# Patient Record
Sex: Male | Born: 1965 | Race: White | Hispanic: No | Marital: Single | Smoking: Never smoker
Health system: Southern US, Community
[De-identification: ages and names within clinical notes are randomized; demographics above are authoritative.]

## PROBLEM LIST (undated history)

## (undated) DIAGNOSIS — M1990 Unspecified osteoarthritis, unspecified site: Secondary | ICD-10-CM

## (undated) HISTORY — PX: FRACTURE SURGERY: SHX138

---

## 2015-03-10 ENCOUNTER — Encounter: Payer: Self-pay | Admitting: Sports Medicine

## 2015-03-10 ENCOUNTER — Ambulatory Visit (INDEPENDENT_AMBULATORY_CARE_PROVIDER_SITE_OTHER): Payer: Managed Care, Other (non HMO) | Admitting: Sports Medicine

## 2015-03-10 VITALS — BP 145/95 | HR 108 | Ht 67.0 in | Wt 167.0 lb

## 2015-03-10 DIAGNOSIS — L74512 Primary focal hyperhidrosis, palms: Secondary | ICD-10-CM | POA: Diagnosis not present

## 2015-03-10 DIAGNOSIS — E785 Hyperlipidemia, unspecified: Secondary | ICD-10-CM | POA: Diagnosis not present

## 2015-03-10 DIAGNOSIS — M65939 Unspecified synovitis and tenosynovitis, unspecified forearm: Secondary | ICD-10-CM | POA: Insufficient documentation

## 2015-03-10 DIAGNOSIS — M720 Palmar fascial fibromatosis [Dupuytren]: Secondary | ICD-10-CM

## 2015-03-10 DIAGNOSIS — M6588 Other synovitis and tenosynovitis, other site: Secondary | ICD-10-CM | POA: Diagnosis not present

## 2015-03-10 DIAGNOSIS — M659 Synovitis and tenosynovitis, unspecified: Secondary | ICD-10-CM | POA: Insufficient documentation

## 2015-03-10 DIAGNOSIS — Z0000 Encounter for general adult medical examination without abnormal findings: Secondary | ICD-10-CM | POA: Insufficient documentation

## 2015-03-10 MED ORDER — ALUMINUM CHLORIDE 20 % EX SOLN: CUTANEOUS | Status: DC

## 2015-03-10 NOTE — Assessment & Plan Note (Signed)
Checking routine blood work. He does have been Dupuytren's contracture so we do need to consider checking for diabetes.

## 2015-03-10 NOTE — Progress Notes (Signed)
Subjective:    CC: Establish care.   HPI:  Right hand pain: Pain is localized at the base of the first metacarpal, as well as in the volar palmar soft tissues. Symptoms have been present for years. Moderate, persistent without radiation.  Preventative measures: Has not had any routine blood work in a long time.  Hyperhidrosis: During the entire examination, I noticed excessive sweating of both hands, to the point where they were dripping with sweat. He has never had this treated, and would like treatment.  Past medical history, Surgical history, Family history not pertinant except as noted below, Social history, Allergies, and medications have been entered into the medical record, reviewed, and no changes needed.   Review of Systems: No headache, visual changes, nausea, vomiting, diarrhea, constipation, dizziness, abdominal pain, skin rash, fevers, chills, night sweats, swollen lymph nodes, weight loss, chest pain, body aches, joint swelling, muscle aches, shortness of breath, mood changes, visual or auditory hallucinations.  Objective:    General: Well Developed, well nourished, and in no acute distress.  Neuro: Alert and oriented x3, extra-ocular muscles intact, sensation grossly intact.  HEENT: Normocephalic, atraumatic, pupils equal round reactive to light, neck supple, no masses, no lymphadenopathy, thyroid nonpalpable.  Skin: Warm and dry, no rashes noted. 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  Respiratory: Clear to auscultation bilaterally. Not using accessory muscles, speaking in full sentences.  Abdominal: Soft, nontender, nondistended, positive bowel sounds, no masses, no organomegaly.  Right hand: Palpable nodularity in the palm are aponeurosis proximal to the fourth digit, with good range of motion, he is also able to put his hand flat on the table, there is also tenderness to palpation at the distal  insertion of the flexor carpi radialis tendon. Negative Tinel's sign, negative Phalen sign, negative Finkelstein sign. There is reproduction of proximal pain with resisted flexion and radial deviation of the wrist. This confirms flexor carpi radialis tendinosis.  Procedure: Real-time Ultrasound Guided Injection of Dupuytren's contracture Device: GE Logiq E  Verbal informed consent obtained.  Time-out conducted.  Noted no overlying erythema, induration, or other signs of local infection.  Skin prepped in a sterile fashion.  Local anesthesia: Topical Ethyl chloride.  With sterile technique and under real time ultrasound guidance:  25-gauge needle advanced into the palm are aponeurosis just proximal to the fourth digit and injected 0.5 mL kenalog 40, 0.5 mL lidocaine. Completed without difficulty  Pain immediately resolved suggesting accurate placement of the medication.  Advised to call if fevers/chills, erythema, induration, drainage, or persistent bleeding.  Images permanently stored and available for review in the ultrasound unit.  Impression: Technically successful ultrasound guided injection.  Procedure: Real-time Ultrasound Guided Injection of right flexor carpi radialis insertion Device: GE Logiq E  Verbal informed consent obtained.  Time-out conducted.  Noted no overlying erythema, induration, or other signs of local infection.  Skin prepped in a sterile fashion.  Local anesthesia: Topical Ethyl chloride.  With sterile technique and under real time ultrasound guidance: Noted hypoechoic change at the distal insertion of the flexor carpi radialis, 25-gauge needle advanced into the tendon sheath and 0.5 mL kenalog 40, 0.5 mL lidocaine ejected easily.  Completed without difficulty  Pain immediately resolved suggesting accurate placement of the medication.  Advised to call if fevers/chills, erythema, induration, drainage, or persistent bleeding.  Images permanently stored and available  for review in the ultrasound unit.  Impression: Technically successful ultrasound guided injection.  Impression and  Recommendations:    The patient was counselled, risk factors were discussed, anticipatory guidance given.

## 2015-03-10 NOTE — Assessment & Plan Note (Signed)
Topical Drysol. Return in a month.

## 2015-03-10 NOTE — Assessment & Plan Note (Signed)
Injection as above 

## 2015-03-10 NOTE — Assessment & Plan Note (Signed)
Mild and principally involves the fourth digit, and the palmar aponeurosis. Patient is fully able to flex his hand on the table, I did perform an injection into the palmar aponeurosis, if persistent or progressive symptoms he would be a candidate for collagenase injection.

## 2015-03-10 NOTE — Patient Instructions (Signed)
Dupuytren's Contracture Dupuytren's contracture affects the fingers and the palm of the hand. This condition usually develops slowly. It may take many years to develop. The pinky finger and the ring finger are most often affected. These fingers start to curve inward, like a claw. At some point, the fingers cannot go straight anymore. This can make it hard to do things like:  Put on gloves.  Shake hands.  Grab something off a shelf. The condition usually does not cause pain and is not dangerous. The condition gets its name from the doctor who came up with an operation to fix the problem. His name was Baron Guillaume Dupuytren. Contracture means pulling inward. CAUSES  Dupuytren's contracture does not start with the fingers. It starts in the palm of the hand, under the skin. The tissue under the skin is called fascia. The fascia covers the cords (tendons) that control how the fingers move. In Dupuytren's contracture the fascia tissue becomes thick and then pulls on the cords. That causes the fingers to curl. The condition can affect both hands and any fingers, but it usually strikes one hand worse than the other. The fingers farthest from the thumb are most often the ones that curl. The cause is not clear. Some experts believe it results from an autoimmune reaction. That means the body's immune system (which fights off disease) attacks itself by mistake. What experts do know is that certain conditions and behaviors (called risk factors) make the chance of having this condition more likely. They include:  Age. Most people who have the condition are older than 50.  Sex. It affects men more often than women.  Family history. The condition tends to run in families from countries in [place].  Certain behaviors. People who smoke and drink alcohol are more apt to develop the problem.  Some other medical conditions. Having diabetes makes Dupuytren's contracture more likely. So does  having a condition that involves a seizure (when the brain's function is interrupted). SYMPTOMS  Signs of this condition take time to develop. Sometimes this takes weeks or months. More often, it takes several years.   Early symptoms:  Skin on the palm of the hand becomes thick. This is usually the first sign.  The skin may look dimpled or puckered.  Lumps (nodules) show up on the palm. There may be one or more lumps. They are not painful.  Later symptoms:  Thick cords of tissue form in the palm of the hand.  The pinky and ring fingers start to curl up into the palm.  The fingers cannot be straightened into their normal position. DIAGNOSIS  A physical examination is the main way that a healthcare provider can tell if you have Dupuytren's contracture. Other tests usually are not needed. The caregiver will probably:  Look at your hands. Feel your hands. This is to check for thickening and nodules.  Measure finger motion. This tells how much your fingers have contracted (pulled in).  Do a tabletop test. You will be asked to try to put your hand flat on a table, palm down. TREATMENT  There is no cure for Dupuytren's contracture. But there are ways to treat the symptoms. Options include:  Watching and waiting. The condition develops slowly. Often it does not create problems for a long time. Sometimes the skin gets thick and nodules form, but the fingers never curl. So, in some cases it is best to just watch the condition carefully and wait to see what happens.    Shots (injections). Different substances may be injected, including:  Steroids. These drugs block swelling. These shots should make the condition less uncomfortable. Steroids may also slow down the condition. Shots are given into the nodules. The effect only lasts awhile. More shots may have to be given.  Enzymes. These are proteins. They weaken the thick tissue. After an injection, the caregiver usually stretches the  fingers.  Needling. A needle is pushed through the skin and into the thick tissue. This is done in several spots. The goal is to break up the thickened tissue. Or to weaken it.  Surgery. This may be suggested if you cannot grasp objects. Or, if you can no longer put your hand in your pocket.  A cut (incision) is made in the palm of the hand. The thick tissue is removed.  Sometimes the thick tissue is attached to the skin. Then, the skin must be removed, too. It is replaced with a piece of skin from another place on your body. That is called a skin graft.  Occupational or hand therapy is almost always needed after surgery. This involves special exercises to get back the use of your hand and fingers. After a skin graft, several months of therapy may be needed.  Sometimes the condition comes back, even after surgery.  Other methods. You can do some things on your own. They include:  Stretching the fingers backwards. Do this often.  Warming the hand and massaging it. Again, do this often.  Using tools with padded grips. This should make things easier.  Wearing heavy gloves while working. This protects the hands. PROGNOSIS  Dupuytren's contracture usually develops slowly. There is no cure. But, the symptoms can be treated. Sometimes they come back after treatment, but not always. It is important to remember that this is a functional problem and not a life-threatening condition. Document Released: 07/28/2009 Document Revised: 12/23/2011 Document Reviewed: 07/28/2009 [employer] Patient Information 2015 [employer]. This information is not intended to replace advice given to you by your health care provider. Make sure you discuss any questions you have with your health care provider.  

## 2015-03-16 DIAGNOSIS — E785 Hyperlipidemia, unspecified: Secondary | ICD-10-CM | POA: Insufficient documentation

## 2015-03-16 LAB — CBC
HCT: 44.1 % (ref 39.0–52.0)
Hemoglobin: 15.1 g/dL (ref 13.0–17.0)
MCH: 29.8 pg (ref 26.0–34.0)
MCHC: 34.2 g/dL (ref 30.0–36.0)
MCV: 87 fL (ref 78.0–100.0)
MPV: 10.3 fL (ref 8.6–12.4)
Platelets: 262 K/uL (ref 150–400)
RBC: 5.07 MIL/uL (ref 4.22–5.81)
RDW: 13.2 % (ref 11.5–15.5)
WBC: 11.3 K/uL — ABNORMAL HIGH (ref 4.0–10.5)

## 2015-03-16 LAB — COMPREHENSIVE METABOLIC PANEL WITH GFR
ALT: 25 U/L (ref 0–53)
AST: 24 U/L (ref 0–37)
Alkaline Phosphatase: 67 U/L (ref 39–117)
BUN: 16 mg/dL (ref 6–23)
CO2: 25 meq/L (ref 19–32)
Chloride: 99 meq/L (ref 96–112)
Creat: 0.97 mg/dL (ref 0.50–1.35)
Total Bilirubin: 1.6 mg/dL — ABNORMAL HIGH (ref 0.2–1.2)

## 2015-03-16 LAB — COMPREHENSIVE METABOLIC PANEL
Albumin: 4.4 g/dL (ref 3.5–5.2)
Calcium: 9.5 mg/dL (ref 8.4–10.5)
Glucose, Bld: 88 mg/dL (ref 70–99)
Potassium: 4.4 mEq/L (ref 3.5–5.3)
Sodium: 137 mEq/L (ref 135–145)
Total Protein: 7.2 g/dL (ref 6.0–8.3)

## 2015-03-16 LAB — LIPID PANEL
Cholesterol: 246 mg/dL — ABNORMAL HIGH (ref 0–200)
HDL: 76 mg/dL (ref >=40)
LDL Cholesterol: 127 mg/dL — ABNORMAL HIGH (ref 0–99)
Total CHOL/HDL Ratio: 3.2 ratio
Triglycerides: 213 mg/dL — ABNORMAL HIGH (ref <150)
VLDL: 43 mg/dL — ABNORMAL HIGH (ref 0–40)

## 2015-03-16 LAB — TSH: TSH: 2.853 u[IU]/mL (ref 0.350–4.500)

## 2015-03-16 LAB — VITAMIN D 25 HYDROXY (VIT D DEFICIENCY, FRACTURES): Vit D, 25-Hydroxy: 23 ng/mL — ABNORMAL LOW (ref 30–100)

## 2015-03-16 LAB — HEMOGLOBIN A1C
Hgb A1c MFr Bld: 5.4 % (ref <5.7)
Mean Plasma Glucose: 108 mg/dL (ref <117)

## 2015-03-16 MED ORDER — VITAMIN D (ERGOCALCIFEROL) 1.25 MG (50000 UNIT) PO CAPS: 50000.0000 [IU] | ORAL_CAPSULE | ORAL | Status: DC

## 2015-03-16 NOTE — Addendum Note (Signed)
Addended by: Monica BectonHEKKEKANDAM, Mandeep Kiser J on: 03/16/2015 06:24 PM   Modules accepted: Orders

## 2015-03-27 ENCOUNTER — Ambulatory Visit (INDEPENDENT_AMBULATORY_CARE_PROVIDER_SITE_OTHER): Payer: Managed Care, Other (non HMO) | Admitting: Family Medicine

## 2015-03-27 ENCOUNTER — Ambulatory Visit (INDEPENDENT_AMBULATORY_CARE_PROVIDER_SITE_OTHER): Payer: Managed Care, Other (non HMO) | Admitting: Sports Medicine

## 2015-03-27 ENCOUNTER — Encounter: Payer: Self-pay | Admitting: Family Medicine

## 2015-03-27 ENCOUNTER — Ambulatory Visit (INDEPENDENT_AMBULATORY_CARE_PROVIDER_SITE_OTHER): Payer: Managed Care, Other (non HMO)

## 2015-03-27 VITALS — BP 120/90 | HR 91 | Ht 67.0 in | Wt 166.0 lb

## 2015-03-27 DIAGNOSIS — M25561 Pain in right knee: Secondary | ICD-10-CM

## 2015-03-27 DIAGNOSIS — M65252 Calcific tendinitis, left thigh: Secondary | ICD-10-CM | POA: Insufficient documentation

## 2015-03-27 DIAGNOSIS — M25562 Pain in left knee: Secondary | ICD-10-CM | POA: Diagnosis not present

## 2015-03-27 DIAGNOSIS — M65939 Unspecified synovitis and tenosynovitis, unspecified forearm: Secondary | ICD-10-CM

## 2015-03-27 DIAGNOSIS — M19041 Primary osteoarthritis, right hand: Secondary | ICD-10-CM

## 2015-03-27 DIAGNOSIS — M6588 Other synovitis and tenosynovitis, other site: Secondary | ICD-10-CM

## 2015-03-27 DIAGNOSIS — M720 Palmar fascial fibromatosis [Dupuytren]: Secondary | ICD-10-CM | POA: Diagnosis not present

## 2015-03-27 DIAGNOSIS — M659 Synovitis and tenosynovitis, unspecified: Secondary | ICD-10-CM

## 2015-03-27 NOTE — Assessment & Plan Note (Signed)
Improved significantly after injection 

## 2015-03-27 NOTE — Assessment & Plan Note (Addendum)
Knee effusion was aspirated and injected. I also placed some medicine into the quadriceps tendon sheath. There was visible calcific tendinitis at the distal insertion as well as synovitis of the distal quadricep sheath. X-rays. Knee immobilizer.  Strap with compressive dressing, formal physical therapy. Out of work for 2 weeks.

## 2015-03-27 NOTE — Progress Notes (Signed)
Subjective:    CC: left knee pain  HPI: This is a pleasant 49 year old male, for the past several days he is increasing and severe pain on the top of his left knee, just proximal to the patella., He doesn't recall any injuries and the pain worsened over a few days.severe, persistent.  Right hand pain: Localized at the third MCP, moderate, persistent without radiation, oral medications are ineffective.  Right flexor carpi radialis tendinitis: Completely resolved after injection at the last visit.  Dupuytren's contracture: He had some pain at the last visit, injection just deep to the palmar aponeurosis was effective, pain has resolved.  Past medical history, Surgical history, Family history not pertinant except as noted below, Social history, Allergies, and medications have been entered into the medical record, reviewed, and no changes needed.   Review of Systems: No fevers, chills, night sweats, weight loss, chest pain, or shortness of breath.   Objective:    General: Well Developed, well nourished, and in no acute distress.  Neuro: Alert and oriented x3, extra-ocular muscles intact, sensation grossly intact.  HEENT: Normocephalic, atraumatic, pupils equal round reactive to light, neck supple, no masses, no lymphadenopathy, thyroid nonpalpable.  Skin: Warm and dry, no rashes. Cardiac: Regular rate and rhythm, no murmurs rubs or gallops, no lower extremity edema.  Respiratory: Clear to auscultation bilaterally. Not using accessory muscles, speaking in full sentences. Left Knee: Tender to palpation at the distal quadriceps, with a palpable effusion in the knee. ROM normal in flexion and extension and lower leg rotation. Ligaments with solid consistent endpoints including ACL, PCL, LCL, MCL. Negative Mcmurray's and provocative meniscal tests. Non painful patellar compression. Patellar and quadriceps tendons unremarkable. Hamstring and quadriceps strength is normal.  Procedure:  Diagnostic Ultrasound of  Left knee Device: GE Logiq E  Findings:noted mild effusion, there were also numerous hyperechoic structures casting shadows at the distal quadriceps insertion to the proximal patella.there is also anechoicchange around the proximal quadriceps tendon Images permanently stored and available for review in the ultrasound unit.  Impression: Knee joint effusion, Quadriceps calcific insertional tendinitis, Quadriceps tenosynovitis  Procedure: Real-time Ultrasound Guided Injection of left knee Device: GE Logiq E  Verbal informed consent obtained.  Time-out conducted.  Noted no overlying erythema, induration, or other signs of local infection.  Skin prepped in a sterile fashion.  Local anesthesia: Topical Ethyl chloride.  With sterile technique and under real time ultrasound guidance:  5 mL straw-colored fluid aspirated, syringe switched and 1 mL kenalog 40, 4 mL lidocaine injected easily.  Completed without difficulty  Pain immediately resolved suggesting accurate placement of the medication.  Advised to call if fevers/chills, erythema, induration, drainage, or persistent bleeding.  Images permanently stored and available for review in the ultrasound unit.  Impression: Technically successful ultrasound guided injection.  Procedure: Real-time Ultrasound Guided Injection of left quadriceps tendon sheath Device: GE Logiq E  Verbal informed consent obtained.  Time-out conducted.  Noted no overlying erythema, induration, or other signs of local infection.  Skin prepped in a sterile fashion.  Local anesthesia: Topical Ethyl chloride.  With sterile technique and under real time ultrasound guidance:  Noted hypoechoic change, 25-gauge needle advanced here and 1 mL kenalog 40, 3 mL lidocaine injected easily. Completed without difficulty  Pain immediately resolved suggesting accurate placement of the medication.  Advised to call if fevers/chills, erythema, induration, drainage,  or persistent bleeding.  Images permanently stored and available for review in the ultrasound unit.  Impression: Technically successful ultrasound guided injection.  Procedure:  Real-time Ultrasound Guided Injection of right third metacarpal phalangeal joint Device: GE Logiq E  Verbal informed consent obtained.  Time-out conducted.  Noted no overlying erythema, induration, or other signs of local infection.  Skin prepped in a sterile fashion.  Local anesthesia: Topical Ethyl chloride.  With sterile technique and under real time ultrasound guidance:  Noted copious surrounding osteoarthritis, 25-gauge needle advanced into joint in 0.5 mL kenalog 40, 0.5 mL lidocaine injected easily. Completed without difficulty  Pain immediately resolved suggesting accurate placement of the medication.  Advised to call if fevers/chills, erythema, induration, drainage, or persistent bleeding.  Images permanently stored and available for review in the ultrasound unit.  Impression: Technically successful ultrasound guided injection.  Impression and Recommendations:

## 2015-03-27 NOTE — Progress Notes (Signed)
   Subjective:    Patient ID: Darren Mcdaniel, male    DOB: 03/24/1966, 49 y.o.   MRN: [id number]  HPI L knee pain started wednesday morning. pt denies any injury/trauma. the knee is tender to touch, swollen, hot,he can bear weight, but it hurts to bend his knee. he has used heat and IBU for relief. pain is 8/10 sharp when bending has been achey. Has been getting progressively worse since Wednesday.   Review of Systems     Objective:   Physical Exam  Constitutional: He appears well-developed and well-nourished.  HENT:  Head: Normocephalic and atraumatic.  Musculoskeletal:  Left knee is swollen and fluid is compressible. He is extremely tender with quadriceps attaches to the patella and a good 4-5 inches over the tendon above the patella. He is unable to extend his knee about 20. Nontender along the joint lines. He is unable to completely extend his hip either. In fact he actually lifts up his pelvis to raise his leg while sitting and walking.  Psychiatric: He has a normal mood and affect. His behavior is normal.          Assessment & Plan:  Left knee pain- based on exam today I'm concerned about her quadriceps tendon tear or partial tear. Referred him to Dr. Rodney Langton for further evaluation to that they can do ultrasound to confirm diagnosis. Recommend iec, elevation and compression.

## 2015-03-27 NOTE — Assessment & Plan Note (Signed)
Right third metacarpal phalangeal joint.  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

## 2015-03-27 NOTE — Assessment & Plan Note (Signed)
Completely resolved after injection 

## 2015-03-28 LAB — SYNOVIAL CELL COUNT + DIFF, W/ CRYSTALS
Crystals, Fluid: NONE SEEN
Eosinophils-Synovial: 0 % (ref 0–1)
Lymphocytes-Synovial Fld: 3 % (ref 0–20)
Monocyte/Macrophage: 82 % (ref 50–90)
Neutrophil, Synovial: 15 % (ref 0–25)
WBC, Synovial: 485 uL — ABNORMAL HIGH (ref 0–200)

## 2015-03-31 LAB — BODY FLUID CULTURE
Gram Stain: NONE SEEN
Organism ID, Bacteria: NO GROWTH

## 2015-04-04 ENCOUNTER — Ambulatory Visit (INDEPENDENT_AMBULATORY_CARE_PROVIDER_SITE_OTHER): Payer: Managed Care, Other (non HMO) | Admitting: Rehabilitative and Restorative Service Providers"

## 2015-04-04 ENCOUNTER — Encounter: Payer: Self-pay | Admitting: Rehabilitative and Restorative Service Providers"

## 2015-04-04 DIAGNOSIS — M25562 Pain in left knee: Secondary | ICD-10-CM | POA: Diagnosis not present

## 2015-04-04 DIAGNOSIS — Z7409 Other reduced mobility: Secondary | ICD-10-CM

## 2015-04-04 DIAGNOSIS — M25662 Stiffness of left knee, not elsewhere classified: Secondary | ICD-10-CM | POA: Diagnosis not present

## 2015-04-04 NOTE — Patient Instructions (Signed)
Self-Mobilization: Knee Flexion (Prone)   Bring left heel toward buttocks as close as possible. Hold 5-10____ seconds. Relax. Repeat __10__ times per set. Do _1-2___ sets per session. Do __1-2__ sessions per day.   Self-Mobilization: Knee Flexion (Hook-Lying)    PROM: Knee Flexion   With towel around left heel, gently pull knee up with towel until stretch is felt. Hold _20___ seconds. Repeat _10___ times per set. Do __1-2__ sets per session. Do _1-2___ sessions per day.   Self-Mobilization: Heel Slide (Supine)   Slide left heel toward buttocks until a gentle stretch is felt. Hold _10___ seconds. Relax. Repeat _10___ times per set. Do _1-2___ sets per session. Do _1-2___ sessions per day.   Strengthening: Quadriceps Set   Tighten muscles on top of thighs by pushing knees down into surface. Hold __10__ seconds. Repeat __10__ times per set. Do __1-2__ sets per session. Do _1-2___ sessions per day.   Strengthening: Straight Leg Raise (Phase 1)   Tighten muscles on front of right thigh, then lift leg _10___ inches from surface, keeping knee locked.  Repeat ___10_ times per set. Do _1-2___ sets per session. Do _1-2__ sessions per day.  Hamstring Step 1   Straighten left knee. Keep knee level with other knee or on bolster. Hold _30_ seconds. Relax knee by returning foot to start. Repeat _3__ times. 2-3 times/day   Wean from brace

## 2015-04-04 NOTE — Therapy (Signed)
[facility] [place] [address] [place], [place], [address] Phone: [phone]   Fax:  [phone]  Physical Therapy Evaluation  Patient Details  Name: Darren Mcdaniel MRN: [id number] Date of Birth: 01-Jun-1966 Referring Provider:  Monica Becton,*  Encounter Date: 04/04/2015      PT End of Session - 04/04/15 1013    Visit Number 1   Number of Visits 12   Date for PT Re-Evaluation 05/16/15   PT Start Time 0814   PT Stop Time 0919   PT Time Calculation (min) 65 min   Activity Tolerance Patient tolerated treatment well   Behavior During Therapy [facility] for tasks assessed/performed      History reviewed. No pertinent past medical history.  History reviewed. No pertinent past surgical history.  There were no vitals filed for this visit.  Visit Diagnosis:  Left knee pain - Plan: PT plan of care cert/re-cert  Stiffness of knee joint, left - Plan: PT plan of care cert/re-cert  Decreased functional mobility and endurance - Plan: PT plan of care cert/re-cert      Subjective Assessment - 04/04/15 0812    Subjective Patient reports onset of Lt knee pain ~2 weeks ago with no known injury. Was seen by MD 03/27/15 and received injection with some improvement.   Pertinent History Fracture Lt arm, nose at 49 Years old   How long can you sit comfortably? no limitations   How long can you stand comfortably? no limitations   How long can you walk comfortably? pain with walking - not as bad with brace.   Diagnostic tests X-ray - calcification of tendon   Currently in Pain? Yes   Pain Score 8    Pain Location Knee   Pain Orientation Left   Pain Descriptors / Indicators Dull;Sharp   Pain Type Acute pain   Pain Onset 1 to 4 weeks ago   Pain Frequency Intermittent   Aggravating Factors  Bending knee   Pain Relieving Factors Keeping the knee straight; using Rt leg to move Lt   Effect of Pain on Daily Activities Out of work             [facility] PT Assessment - 04/04/15 0001    Assessment   Medical Diagnosis Lt knee pain   Onset Date/Surgical Date 03/23/15   Hand Dominance Right   Next MD Visit 04/08/15   Balance Screen   Has the patient fallen in the past 6 months No   Home Environment   Living Environment Private residence   Living Arrangements Alone   Type of Home House   Home Access Level entry   Home Layout One level   Prior Function   Level of Independence Independent   Vocation Full time employment   Vocation Requirements [place] - Walking on concrete; lifting ~50 lb; runs Hotel manager; fishing; English as a second language teacher; yard work; household chores; cooking   Observation/Other Assessments   Focus on Therapeutic Outcomes (FOTO)  61%   Sensation   Additional Comments WNL's per patient report   AROM   Right Knee Extension 0   Right Knee Flexion 142   Left Knee Extension 0   Left Knee Flexion 92   Strength   Overall Strength Comments Bilat hips 5/5   Right Knee Flexion 5/5   Right Knee Extension 5/5   Left Knee Flexion 5/5   Left Knee Extension 5/5  Pain with resistance   Flexibility   Soft Tissue Assessment Staci Righter  Length --  tightness noted Lt quad/painful at prox patella   Palpation   Patella mobility decreased mobility Lt          [facility] Adult PT Treatment/Exercise - 04/04/15 0001    Ambulation/Gait   Gait Comments limp Lt LE with immobilizer in place circumducts Lt LE in swing phase, decreased weight bearing phase Lt LE   Posture/Postural Control   Posture Comments stands with weight shifted to Rt LE   Exercises   Exercises --  worked on gait without knee immobilizer   Knee/Hip Exercises: Stretches   Passive Hamstring Stretch 3 reps;30 seconds   Passive Hamstring Stretch Limitations with strap   Knee: Self-Stretch to increase Flexion --  10 reps 10 sec hold with strap   Knee: Self-Stretch Limitations stretch in supine and in prone   Knee/Hip Exercises: Supine   Heel  Slides AAROM;Left;2 sets;10 reps  using strap to assist and towel under heel   Moist Heat Therapy   Number Minutes Moist Heat 10 Minutes   Moist Heat Location Knee  Prior to ROM exercises   Cryotherapy   Number Minutes Cryotherapy 12 Minutes   Cryotherapy Location Knee  after exercise   Type of Cryotherapy Ice pack   Manual Therapy   Manual therapy comments instructed in myofacial release techniques using balls and the stick working through [employer]           PT Education - 04/04/15 0857    Education provided Yes   Education Details Importance of movement in resolving musculoskeletal problems; walking without knee immobilizer; HEP   Person(s) Educated Patient   Methods Explanation;Demonstration;Tactile cues;Verbal cues;Handout   Comprehension Verbalized understanding;Returned demonstration;Verbal cues required;Tactile cues required          PT Short Term Goals - 04/04/15 1030    PT SHORT TERM GOAL #1   Title Patient I with I HEP 04/18/15   Time 2   Period Weeks   Status New   PT SHORT TERM GOAL #2   Title Increaese AROM Lt knee to 130 degrees flexion 04/28/15   Time 2   Period Weeks   Status New   PT SHORT TERM GOAL #3   Title Wean from immobilizer Lt LE completely 04/18/15   Time 2   Period Weeks   Status New   PT SHORT TERM GOAL #4   Title Normal gait pattern 04/25/15   Time 3   Period Weeks   Status New           PT Long Term Goals - 04/04/15 1032    PT LONG TERM GOAL #1   Title Patient I in HEP for discharge - 05/16/15   Time 6   Period Weeks   Status New   PT LONG TERM GOAL #2   Title Full painfree ROM Lt knee 05/16/15   Time 6   Period Weeks   Status New   PT LONG TERM GOAL #3   Title Decresae pain to 0-2/10 05/16/15   Time 6   Period Weeks   Status New   PT LONG TERM GOAL #4   Title Decrease FOTO to </= 32% limitation   Time 6   Period Weeks   Status New           Plan - 04/04/15 1015    Clinical Impression Statement Patient  presents with painful Lt knee with no known cause of injury. He received an injection 03/27/15 with good results. He continues to ambulate in an  immobilizer with abnormal gait pattern. He has limited ROM and pain with active knee movement. he does not have pain with weight bearing. Atrength is good.    Pt will benefit from skilled therapeutic intervention in order to improve on the following deficits Abnormal gait;Decreased range of motion;Difficulty walking;Decreased activity tolerance;Pain;Impaired flexibility;Improper body mechanics;Decreased mobility   Rehab Potential Excellent   PT Frequency 2x / week   PT Duration 6 weeks   PT Treatment/Interventions ADLs/Self Care Home Management;Cryotherapy;Electrical Stimulation;Iontophoresis 4mg /ml Dexamethasone;Moist Heat;Ultrasound;Gait training;Functional mobility training;Therapeutic activities;Therapeutic exercise;Balance training;Neuromuscular re-education;Patient/family education;Manual techniques;Passive range of motion   PT Next Visit Plan Review exercises; add quad sets and SLR; progress with ROM; progress with gait without knee immobilizer; progress functional strengthening Lt LE   PT Home Exercise Plan HEP - working on ROM Lt knee; wean from immobilizer; myofacial release with ball and stick; ice and heat as needed   Consulted and Agree with Plan of Care Patient         Problem List Patient Active Problem List   Diagnosis Date Noted  . Calcific tendinitis of left Quadriceps insertion 03/27/2015  . Primary osteoarthritis of right hand 03/27/2015  . Hyperlipidemia 03/16/2015  . Dupuytren's contracture of right hand 03/10/2015  . 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 Annual physical exam 03/10/2015  . 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    Ranesha Val Rober Minion, PT, MPH 04/04/2015, 11:09 AM  [facility] [address] [place], [place], [address] Phone: [phone]    Fax:  [phone]

## 2015-04-06 ENCOUNTER — Encounter: Payer: Self-pay | Admitting: Rehabilitative and Restorative Service Providers"

## 2015-04-06 ENCOUNTER — Ambulatory Visit (INDEPENDENT_AMBULATORY_CARE_PROVIDER_SITE_OTHER): Payer: Managed Care, Other (non HMO) | Admitting: Rehabilitative and Restorative Service Providers"

## 2015-04-06 DIAGNOSIS — M25662 Stiffness of left knee, not elsewhere classified: Secondary | ICD-10-CM

## 2015-04-06 DIAGNOSIS — M25562 Pain in left knee: Secondary | ICD-10-CM

## 2015-04-06 DIAGNOSIS — Z7409 Other reduced mobility: Secondary | ICD-10-CM

## 2015-04-06 NOTE — Patient Instructions (Signed)
Self-Mobilization: Downward Kneecap Push   With thumbs on upper border of left kneecap, gently push kneecap toward foot. Hold _10-20___ seconds. Repeat _5-10__ times per set. Do _3-4___ sessions per day.   Sit and lie down with knee bent, holding the bend in comfortable position for several minutes  Stand shifting weight side to side - equal to the left  Work on walking without limping

## 2015-04-06 NOTE — Therapy (Signed)
[place]  [address] [place], [place], [address] Phone: [phone]   Fax:  [phone]  Physical Therapy Treatment  Patient Details  Name: Darren Mcdaniel MRN: [id number] Date of Birth: 04-22-1966 Referring Provider:  Silverio Decamp,*  Encounter Date: 04/06/2015      PT End of Session - 04/06/15 0954    Visit Number 2   Number of Visits 12   Date for PT Re-Evaluation 05/16/15   PT Start Time 0853   PT Stop Time 0950   PT Time Calculation (min) 57 min   Activity Tolerance Patient tolerated treatment well;No increased pain      History reviewed. No pertinent past medical history.  History reviewed. No pertinent past surgical history.  There were no vitals filed for this visit.  Visit Diagnosis:  Left knee pain  Stiffness of knee joint, left  Decreased functional mobility and endurance      Subjective Assessment - 04/06/15 0852    Subjective Darren Mcdaniel reports that his knee is feeling some better. He is working on his exercises at home and has weaned out of the knee immobilizer.   Currently in Pain? Yes   Pain Score 5    Pain Location Knee   Pain Orientation Left   Pain Descriptors / Indicators Dull   Pain Type Acute pain   Pain Onset 1 to 4 weeks ago   Pain Frequency Intermittent   Aggravating Factors  Bending knee, steps   Pain Relieving Factors keeping knee straight, using rt leg to move lt   Effect of Pain on Daily Activities remains out of work          [facility] PT Assessment - 04/06/15 0001    AROM   Left Knee Flexion 125   Palpation   Patella mobility decreased mobility Lt          [facility] Adult PT Treatment/Exercise - 04/06/15 0001    Knee/Hip Exercises: Stretches   Passive Hamstring Stretch 3 reps;30 seconds   Passive Hamstring Stretch Limitations with strap   Knee: Self-Stretch to increase Flexion --  10 reps 10 sec hold with strap   Knee: Self-Stretch Limitations stretch in supine  and in prone   Knee/Hip Exercises: Aerobic   Stationary Bike NuStep L2  5 min for ROM  patient did not go through full range   Knee/Hip Exercises: Standing   Wall Squat 10 reps;2 sets  1 2 sec hold   Wall Squat Limitations shallow knee bend   Other Standing Knee Exercises working on gait without limp   Other Standing Knee Exercises weight shift standig at wall 10-20   Knee/Hip Exercises: Supine   Heel Slides AAROM;Left;2 sets;10 reps  using strap to assist and towel under heel   Cryotherapy   Number Minutes Cryotherapy 15 Minutes   Cryotherapy Location Knee  after exercise   Type of Cryotherapy Ice pack   Ultrasound   Ultrasound Location distal quad proximal to patellar border   Ultrasound Parameters 3.3MHZ; 1.2 w/cm2 8 min   Ultrasound Goals Pain;Other (Comment)  tissue extensibility   Manual Therapy   Manual Therapy Soft tissue mobilization;Myofascial release  quads - very tight/tender at superior patella lat>med   Manual therapy comments Patellar mobilization tightest with moving into inferior direction          PT Education - 04/06/15 0952    Education provided Yes   Education Details Continued education re musculoskeletal injuries/healing and the importance of exercises; reviewed and corrected  exercises; added patellar mobilization and weight shift/weight bearing exercises for home.   Person(s) Educated Patient   Methods Explanation;Demonstration;Tactile cues;Verbal cues;Handout   Comprehension Verbalized understanding;Returned demonstration;Verbal cues required;Tactile cues required          PT Short Term Goals - 04/06/15 1000    PT SHORT TERM GOAL #1   Title Patient I with I HEP 04/18/15   Time 2   Period Weeks   Status On-going   PT SHORT TERM GOAL #2   Title Increaese AROM Lt knee to 130 degrees flexion 04/28/15   Baseline AROM 125 degrees today   Time 2   Period Weeks   Status Partially Met   PT SHORT TERM GOAL #3   Title Wean from [place]  completely 04/18/15   Baseline Wearing immobilizer occasionally - remains in ace wrap most of the day   Time 2   Period Weeks   PT SHORT TERM GOAL #4   Title Normal gait pattern 04/25/15   Period Weeks   Status On-going           PT Long Term Goals - 04/06/15 1001    PT LONG TERM GOAL #1   Title Patient I in HEP for discharge - 05/16/15   Time 6   Period Weeks   Status On-going   PT LONG TERM GOAL #2   Title Full painfree ROM Lt knee 05/16/15   Time 6   Period Weeks   Status On-going   PT LONG TERM GOAL #3   Title Decresae pain to 0-2/10 05/16/15   Time 6   Period Weeks   Status On-going   PT LONG TERM GOAL #4   Title Decrease FOTO to </= 32% limitation   Time 6   Period Weeks   Status On-going            Plan - 04/06/15 0955    Clinical Impression Statement Darren Mcdaniel reports improvement in knee pain. He has mostly weaned from the [place] but continues to use the ace wrap some. 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 He is moving his knee more at home.   Pt will benefit from skilled therapeutic intervention in order to improve on the following deficits Abnormal gait;Decreased range of motion;Difficulty walking;Decreased activity tolerance;Pain;Impaired flexibility;Improper body mechanics;Decreased mobility   Rehab Potential Excellent   PT Frequency 2x / week   PT Duration 6 weeks   PT Next Visit Plan Review exercises; continue with soft tissue mobilization through quads and patellar mobilizatioin; add quad sets and SLR if patient has pain free ROM; progress with ROM as needed; progress with gait without knee ace wrap wrking on weight shift and gait pattern; progress functional strengthening Lt LE as indicated.   PT Home Exercise Plan HEP - working on ROM Lt knee; wean from immobilizer; myofacial release with ball and stick; ice and heat as needed   Consulted and Agree with Plan of Care Patient      Problem List Patient  Active Problem List   Diagnosis Date Noted  . Calcific tendinitis of left Quadriceps insertion 03/27/2015  . Primary osteoarthritis of right hand 03/27/2015  . Hyperlipidemia 03/16/2015  . Dupuytren's contracture of right hand 03/10/2015  . 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 Annual physical exam 03/10/2015  . 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    Maleeya Peterkin Nilda Simmer, PT, MPH 04/06/2015, 10:04 AM  [place] [address] [place], [place], [address] Phone: [phone]   Fax:  [phone]      

## 2015-04-07 ENCOUNTER — Telehealth: Payer: Self-pay | Admitting: Sports Medicine

## 2015-04-07 ENCOUNTER — Encounter: Payer: Self-pay | Admitting: Sports Medicine

## 2015-04-07 ENCOUNTER — Ambulatory Visit (INDEPENDENT_AMBULATORY_CARE_PROVIDER_SITE_OTHER): Payer: Managed Care, Other (non HMO) | Admitting: Sports Medicine

## 2015-04-07 VITALS — BP 144/92 | HR 83 | Ht 66.0 in | Wt 163.0 lb

## 2015-04-07 DIAGNOSIS — M65252 Calcific tendinitis, left thigh: Secondary | ICD-10-CM | POA: Diagnosis not present

## 2015-04-07 DIAGNOSIS — M19041 Primary osteoarthritis, right hand: Secondary | ICD-10-CM | POA: Diagnosis not present

## 2015-04-07 NOTE — Telephone Encounter (Signed)
Patient walked in and gave fax number [phone] [employer] for [employer]. Thanks

## 2015-04-07 NOTE — Progress Notes (Signed)
  Subjective:    CC: Follow-up  HPI: Darren Mcdaniel returns, his right third MCP is completely better after the injection. He also has had near complete resolution of his quadriceps tendinitis pain, and only has minimal insertional tenderness, he continues with physical therapy. Happy with how things are going but not yesterday to return to work, he is currently on short-term disability.  Past medical history, Surgical history, Family history not pertinant except as noted below, Social history, Allergies, and medications have been entered into the medical record, reviewed, and no changes needed.   Review of Systems: No fevers, chills, night sweats, weight loss, chest pain, or shortness of breath.   Objective:    General: Well Developed, well nourished, and in no acute distress.  Neuro: Alert and oriented x3, extra-ocular muscles intact, sensation grossly intact.  HEENT: Normocephalic, atraumatic, pupils equal round reactive to light, neck supple, no masses, no lymphadenopathy, thyroid nonpalpable.  Skin: Warm and dry, no rashes. Cardiac: Regular rate and rhythm, no murmurs rubs or gallops, no lower extremity edema.  Respiratory: Clear to auscultation bilaterally. Not using accessory muscles, speaking in full sentences. Right hand: No longer tender or swollen over the third metacarpal phalangeal joint Left Knee: Only minimal visible fullness at the quadriceps insertion into the proximal patella, with minimal tenderness at this location. ROM normal in flexion and extension and lower leg rotation. Ligaments with solid consistent endpoints including ACL, PCL, LCL, MCL. Negative Mcmurray's and provocative meniscal tests. Non painful patellar compression. Patellar and quadriceps tendons unremarkable. Hamstring and quadriceps strength is normal.  Impression and Recommendations:

## 2015-04-07 NOTE — Telephone Encounter (Signed)
Last 3 OV notes from Dr. Karie Schwalbe has been faxed to [facility]. [phone]. Darren Mcdaniel,CMA

## 2015-04-07 NOTE — Assessment & Plan Note (Signed)
Pain and swelling lately resolved after right third MCP injection at the last visit

## 2015-04-07 NOTE — Telephone Encounter (Signed)
Darren Mcdaniel would you fax his last couple office notes to this number?   Thank you.

## 2015-04-07 NOTE — Assessment & Plan Note (Signed)
Has done exquisitely well with regards to the calcific tendinitis of the left quadriceps insertion after percutaneous tenotomy with injection, he did have a significant quadriceps sheath effusion. Continue physical therapy for at least another month, pain is 100% better at rest and approximately 50% better with activity. He has not yet plateaued.  Continue additional month off of work, he is currently on short-term disability.

## 2015-04-10 ENCOUNTER — Ambulatory Visit (INDEPENDENT_AMBULATORY_CARE_PROVIDER_SITE_OTHER): Payer: Managed Care, Other (non HMO) | Admitting: Physical Therapy

## 2015-04-10 DIAGNOSIS — M25562 Pain in left knee: Secondary | ICD-10-CM | POA: Diagnosis not present

## 2015-04-10 DIAGNOSIS — M25662 Stiffness of left knee, not elsewhere classified: Secondary | ICD-10-CM | POA: Diagnosis not present

## 2015-04-10 DIAGNOSIS — Z7409 Other reduced mobility: Secondary | ICD-10-CM

## 2015-04-10 NOTE — Therapy (Signed)
[facility] [place] [address] [place], [place], [address] Phone: [phone]   Fax:  [phone]  Physical Therapy Treatment  Patient Details  Name: Darren Mcdaniel MRN: [id number] Date of Birth: 20-Nov-1965 Referring Provider:  Monica Becton,*  Encounter Date: 04/10/2015      PT End of Session - 04/10/15 1014    Visit Number 3   Number of Visits 12   Date for PT Re-Evaluation 05/16/15   PT Start Time 0927   PT Stop Time 1012   PT Time Calculation (min) 45 min   Activity Tolerance Patient tolerated treatment well;No increased pain   Behavior During Therapy [facility] for tasks assessed/performed      No past medical history on file.  No past surgical history on file.  There were no vitals filed for this visit.  Visit Diagnosis:  Left knee pain  Stiffness of knee joint, left  Decreased functional mobility and endurance      Subjective Assessment - 04/10/15 0931    Subjective getting better; doing exercises "some" (1x/day).  "I've been doing a lot of walking, does that count?"   Diagnostic tests X-ray - calcification of tendon   Currently in Pain? Yes   Pain Score 2    Pain Location Knee   Pain Orientation Left   Pain Descriptors / Indicators Dull;Aching;Sore   Pain Type Acute pain   Pain Onset 1 to 4 weeks ago   Aggravating Factors  bending knee and deep squatting            [facility] PT Assessment - 04/10/15 0947    AROM   Left Knee Flexion 139                     [facility] Adult PT Treatment/Exercise - 04/10/15 0933    Knee/Hip Exercises: Aerobic   Stationary Bike NuStep L4 x 6 min (improved motion); Rec bike x 6 min level 1 for ROM   Knee/Hip Exercises: Supine   Quad Sets Strengthening;Left;2 sets;10 reps   Short Arc [employer] Sets Strengthening;Left;2 sets;10 reps   Short Arc Quad Sets Limitations 2#   Heel Slides AAROM;Left;1 set;10 reps   Heel Slides Limitations with strap   Straight Leg  Raises Left;2 sets;10 reps   Straight Leg Raises Limitations 2#   Ultrasound   Ultrasound Location distal quad   Ultrasound Parameters 3.3 mHz; 1.1 w/cm2, 100% DC x 8 min   Ultrasound Goals Pain;Other (Comment)  tissue extensibility                  PT Short Term Goals - 04/10/15 1015    PT SHORT TERM GOAL #1   Title Patient I with I HEP 04/18/15   Status On-going   PT SHORT TERM GOAL #2   Title Increaese AROM Lt knee to 130 degrees flexion 04/28/15   Status Achieved   PT SHORT TERM GOAL #3   Title Wean from immobilizer Lt LE completely 04/18/15   Status Achieved   PT SHORT TERM GOAL #4   Title Normal gait pattern 04/25/15   Status On-going           PT Long Term Goals - 04/06/15 1001    PT LONG TERM GOAL #1   Title Patient I in HEP for discharge - 05/16/15   Time 6   Period Weeks   Status On-going   PT LONG TERM GOAL #2   Title Full painfree ROM Lt knee 05/16/15  Time 6   Period Weeks   Status On-going   PT LONG TERM GOAL #3   Title Decresae pain to 0-2/10 05/16/15   Time 6   Period Weeks   Status On-going   PT LONG TERM GOAL #4   Title Decrease FOTO to </= 32% limitation   Time 6   Period Weeks   Status On-going               Plan - 04/10/15 1014    Clinical Impression Statement Pt tolerated increased strengthening exercises today but noticable fatigue of quad with mat level exercises.  ROM WNL at this time.   PT Next Visit Plan add strengthening exercises   Consulted and Agree with Plan of Care Patient        Problem List Patient Active Problem List   Diagnosis Date Noted  . Calcific tendinitis of left Quadriceps insertion 03/27/2015  . Primary osteoarthritis of right hand 03/27/2015  . Hyperlipidemia 03/16/2015  . Dupuytren's contracture of right hand 03/10/2015  . 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 Annual physical exam 03/10/2015  . 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   Clarita Crane, PT,  DPT 04/10/2015 10:16 AM  [facility] [address] [place], [place], [address] Phone: [phone]   Fax:  [phone]

## 2015-04-12 ENCOUNTER — Ambulatory Visit (INDEPENDENT_AMBULATORY_CARE_PROVIDER_SITE_OTHER): Payer: Managed Care, Other (non HMO) | Admitting: Physical Therapy

## 2015-04-12 DIAGNOSIS — Z7409 Other reduced mobility: Secondary | ICD-10-CM | POA: Diagnosis not present

## 2015-04-12 DIAGNOSIS — M25562 Pain in left knee: Secondary | ICD-10-CM

## 2015-04-12 DIAGNOSIS — M25662 Stiffness of left knee, not elsewhere classified: Secondary | ICD-10-CM

## 2015-04-12 NOTE — Patient Instructions (Signed)
Quad Set   Slowly tighten muscles on thigh of left leg while counting out loud to __5__.  Repeat __20__ times. Do __1-2__ sessions per day.  http://gt2.exer.us/361   Copyright  VHI. All rights reserved.    KNEE: Extension, Short Arc Quads - Supine   Place bolster under knees. Raise one leg until knee is straight. _10__ reps per set, _2__ sets per session,  _1-2__ times per day.   Copyright  VHI. All rights reserved.    Hip Flexion / Knee Extension: Straight-Leg Raise (Eccentric)   Lie on back. Lift leg with knee straight. Slowly lower leg. _10__ reps per set, _2__ sets per session, _1-2__ sessions per day.    ABDUCTION: Side-Lying (Active)   Lie on right side, top leg straight. Raise top leg as far as possible. Use _2__ lbs. Complete _2__ sets of _20__ repetitions. Perform _1-2__ sessions per day.  http://gtsc.exer.us/94   (Home) Extension: Hip   With support under abdomen, tighten stomach. Lift left leg in line with body. Can use 2 lbs. Repeat _10___ times per set. Do __2__ sets per session. Do __1-2__ sessions per day.  ADDUCTION: Side-Lying (Active)   Lie on right side, with top leg bent and in front of other leg. Lift straight leg up as high as possible. Use _2__ lbs. Complete __2_ sets of __10_ repetitions. Perform _1-2__ sessions per day.  http://gtsc.exer.us/129   Copyright  VHI. All rights reserved.   Hamstrings   Lie on stomach with __2__ pound weight around left ankle. Bend same knee _90___ degrees, pointing toes toward knee. Do not bend hips. Hold __1-2__ seconds. Repeat __20__ times. Do __1-2__ sessions per day. CAUTION: Move slowly.  Copyright  VHI. All rights reserved.    [facility] [address] [place], [place] [address]  [phone] (office) [phone] (fax)

## 2015-04-12 NOTE — Therapy (Signed)
[facility] [address] [place], [place], [address] Phone: [phone]   Fax:  [phone]  Physical Therapy Treatment  Patient Details  Name: Darren Mcdaniel MRN: [id number] Date of Birth: 11/02/1965 Referring Provider:  Monica Bectonhekkekandam, Thomas J,*  Encounter Date: 04/12/2015      PT End of Session - 04/12/15 1019    Visit Number 4   Number of Visits 12   Date for PT Re-Evaluation 05/16/15   PT Start Time 0930   PT Stop Time 1010   PT Time Calculation (min) 40 min   Activity Tolerance Patient tolerated treatment well;No increased pain   Behavior During Therapy [facility] for tasks assessed/performed      No past medical history on file.  No past surgical history on file.  There were no vitals filed for this visit.  Visit Diagnosis:  Left knee pain  Stiffness of knee joint, left  Decreased functional mobility and endurance      Subjective Assessment - 04/12/15 0931    Subjective knee is sore today; tried to go up/down stairs reciprocally yesterday and thinks he overdid it.   Diagnostic tests X-ray - calcification of tendon   Currently in Pain? Yes   Pain Score 2    Pain Location Knee   Pain Orientation Left                         [facility] Adult PT Treatment/Exercise - 04/12/15 0935    Knee/Hip Exercises: Aerobic   Stationary Bike NuStep L6 x 6 min (improved motion); Rec bike x 6 min level 1 for ROM   Knee/Hip Exercises: Supine   Quad Sets Strengthening;Left;2 sets;10 reps   Short Arc Quad Sets Strengthening;Left;2 sets;10 reps   Short Arc Quad Sets Limitations 2#   Straight Leg Raises Left;2 sets;10 reps   Straight Leg Raises Limitations 2#   Knee/Hip Exercises: Sidelying   Hip ABduction Strengthening;Left;2 sets;10 reps   Hip ABduction Limitations 2#   Hip ADduction Strengthening;Left;2 sets;10 reps   Hip ADduction Limitations 2#   Knee/Hip Exercises: Prone   Hamstring Curl 2 sets;10 reps   Hamstring Curl Limitations 2#   Hip Extension Strengthening;Left;2 sets;10 reps   Hip Extension Limitations 2#   Ultrasound   Ultrasound Location distal quad proximal to patellar border   Ultrasound Parameters 3.3 mHz, 1.2 w/cm2; 50% DC x 8 min   Ultrasound Goals Pain;Other (Comment)  tissue extensibility                PT Education - 04/12/15 1018    Education provided Yes   Education Details HEP for strengthening; where to purchase ankle weights if pt desires to use for home   Person(s) Educated Patient   Methods Explanation;Demonstration;Handout   Comprehension Verbalized understanding;Need further instruction;Returned demonstration          PT Short Term Goals - 04/10/15 1015    PT SHORT TERM GOAL #1   Title Patient I with I HEP 04/18/15   Status On-going   PT SHORT TERM GOAL #2   Title Increaese AROM Lt knee to 130 degrees flexion 04/28/15   Status Achieved   PT SHORT TERM GOAL #3   Title Wean from immobilizer Lt LE completely 04/18/15   Status Achieved   PT SHORT TERM GOAL #4   Title Normal gait pattern 04/25/15   Status On-going           PT Long Term Goals - 04/06/15  1001    PT LONG TERM GOAL #1   Title Patient I in HEP for discharge - 05/16/15   Time 6   Period Weeks   Status On-going   PT LONG TERM GOAL #2   Title Full painfree ROM Lt knee 05/16/15   Time 6   Period Weeks   Status On-going   PT LONG TERM GOAL #3   Title Decresae pain to 0-2/10 05/16/15   Time 6   Period Weeks   Status On-going   PT LONG TERM GOAL #4   Title Decrease FOTO to </= 32% limitation   Time 6   Period Weeks   Status On-going               Plan - 04/12/15 1019    Clinical Impression Statement Pt continues to demonstrate improved mobility and decreased pain.  Still having tenderness on proximal and lateral patella but improved pain overall.   PT Next Visit Plan review HEP; progress weightbearing exercises        Problem List Patient Active  Problem List   Diagnosis Date Noted  . Calcific tendinitis of left Quadriceps insertion 03/27/2015  . Primary osteoarthritis of right hand 03/27/2015  . Hyperlipidemia 03/16/2015  . Dupuytren's contracture of right hand 03/10/2015  . 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 Annual physical exam 03/10/2015  . 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   Darren Mcdaniel, PT, DPT 04/12/2015 10:26 AM  [facility] [address] [place], [place], [address] Phone: [phone]   Fax:  [phone]

## 2015-04-21 ENCOUNTER — Ambulatory Visit (INDEPENDENT_AMBULATORY_CARE_PROVIDER_SITE_OTHER): Payer: Managed Care, Other (non HMO) | Admitting: Physical Therapy

## 2015-04-21 DIAGNOSIS — M25662 Stiffness of left knee, not elsewhere classified: Secondary | ICD-10-CM | POA: Diagnosis not present

## 2015-04-21 DIAGNOSIS — M25562 Pain in left knee: Secondary | ICD-10-CM

## 2015-04-21 DIAGNOSIS — Z7409 Other reduced mobility: Secondary | ICD-10-CM

## 2015-04-21 NOTE — Therapy (Signed)
[facility] [address] [place], [place], [address] Phone: [phone]   Fax:  [phone]  Physical Therapy Treatment  Patient Details  Name: Darren ButterSteven Mcdaniel MRN: [id number] Date of Birth: 05/16/1966 Referring Provider:  Monica Bectonhekkekandam, Thomas J,*  Encounter Date: 04/21/2015      PT End of Session - 04/21/15 1029    Visit Number 5   Number of Visits 12   Date for PT Re-Evaluation 05/16/15   PT Start Time 0932   PT Stop Time 1016   PT Time Calculation (min) 44 min   Activity Tolerance Patient tolerated treatment well;No increased pain   Behavior During Therapy [facility] for tasks assessed/performed      No past medical history on file.  No past surgical history on file.  There were no vitals filed for this visit.  Visit Diagnosis:  Left knee pain  Stiffness of knee joint, left  Decreased functional mobility and endurance      Subjective Assessment - 04/21/15 0935    Subjective had no significant pain for the past week but is sore again today.  just tender to touch otherwise no pain.   Currently in Pain? No/denies                         [facility] Adult PT Treatment/Exercise - 04/21/15 0936    Knee/Hip Exercises: Aerobic   Elliptical L2.5 x    Recumbent Bike L3 x 6 min   Knee/Hip Exercises: Machines for Strengthening   Cybex Knee Extension 1 plate [id number]; BLE to extend; LLE only eccentric (significant weakness and quivering of muscle with eccentric control)   Knee/Hip Exercises: Standing   Lateral Step Up Left;2 sets;10 reps;Step Height: 8"   Forward Step Up Left;2 sets;10 reps;Step Height: 8"   Forward Step Up Limitations mild instability with descending   Knee/Hip Exercises: Supine   Short Arc Quad Sets Strengthening;Left;10 reps;2 sets   Short Arc Quad Sets Limitations 5# with external rotation   Straight Leg Raise with External Rotation Strengthening;Left;1 set;10 reps   Straight Leg Raise with  External Rotation Limitations 5#   Ultrasound   Ultrasound Location distal quad proximal to patellar border   Ultrasound Parameters 3.53mHz, 1.2 w/cm2, 50% DC x 8 min   Ultrasound Goals Pain   Iontophoresis   Type of Iontophoresis Dexamethasone   Location superior pole patella   Dose 1.0 cc; 80 mA*min   Time 6 hour patch                PT Education - 04/21/15 1029    Education provided Yes   Education Details [place]   Person(s) Educated Patient   Methods Explanation   Comprehension Verbalized understanding          PT Short Term Goals - 04/10/15 1015    PT SHORT TERM GOAL #1   Title Patient I with I HEP 04/18/15   Status On-going   PT SHORT TERM GOAL #2   Title Increaese AROM Lt knee to 130 degrees flexion 04/28/15   Status Achieved   PT SHORT TERM GOAL #3   Title Wean from immobilizer Lt LE completely 04/18/15   Status Achieved   PT SHORT TERM GOAL #4   Title Normal gait pattern 04/25/15   Status On-going           PT Long Term Goals - 04/06/15 1001    PT LONG TERM GOAL #1   Title Patient I  in HEP for discharge - 05/16/15   Time 6   Period Weeks   Status On-going   PT LONG TERM GOAL #2   Title Full painfree ROM Lt knee 05/16/15   Time 6   Period Weeks   Status On-going   PT LONG TERM GOAL #3   Title Decresae pain to 0-2/10 05/16/15   Time 6   Period Weeks   Status On-going   PT LONG TERM GOAL #4   Title Decrease FOTO to </= 32% limitation   Time 6   Period Weeks   Status On-going               Plan - 04/21/15 1029    Clinical Impression Statement Pt demonstrates significant L quad weakness, especially with eccentric control.  Overall pain improving.  Trialed ionto to see if pt gets additional relief.   PT Next Visit Plan review HEP; progress weightbearing exercises, modalities PRN   Consulted and Agree with Plan of Care Patient        Problem List Patient Active Problem List   Diagnosis Date Noted  . Calcific tendinitis of  left Quadriceps insertion 03/27/2015  . Primary osteoarthritis of right hand 03/27/2015  . Hyperlipidemia 03/16/2015  . Dupuytren's contracture of right hand 03/10/2015  . 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 Annual physical exam 03/10/2015  . 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   Clarita Crane, PT, DPT 04/21/2015 10:32 AM  [facility] [address] [place], [place], [address] Phone: [phone]   Fax:  [phone]

## 2015-04-24 ENCOUNTER — Encounter: Payer: Self-pay | Admitting: Physical Therapy

## 2015-04-24 ENCOUNTER — Ambulatory Visit (INDEPENDENT_AMBULATORY_CARE_PROVIDER_SITE_OTHER): Payer: Managed Care, Other (non HMO) | Admitting: Physical Therapy

## 2015-04-24 DIAGNOSIS — Z7409 Other reduced mobility: Secondary | ICD-10-CM

## 2015-04-24 DIAGNOSIS — M25662 Stiffness of left knee, not elsewhere classified: Secondary | ICD-10-CM | POA: Diagnosis not present

## 2015-04-24 DIAGNOSIS — M25562 Pain in left knee: Secondary | ICD-10-CM

## 2015-04-24 NOTE — Therapy (Signed)
[place] [address] [place], [place], [address] Phone: [phone]   Fax:  [phone]  Physical Therapy Treatment  Patient Details  Name: Darren Mcdaniel MRN: [id number] Date of Birth: [birth year] Referring Provider:  Silverio Decamp,*  Encounter Date: 04/24/2015      PT End of Session - 04/24/15 0843    Visit Number 6   Number of Visits 12   Date for PT Re-Evaluation 05/16/15   PT Start Time 0844   PT Stop Time 0927   PT Time Calculation (min) 43 min      History reviewed. No pertinent past medical history.  History reviewed. No pertinent past surgical history.  There were no vitals filed for this visit.  Visit Diagnosis:  Left knee pain  Stiffness of knee joint, left  Decreased functional mobility and endurance      Subjective Assessment - 04/24/15 0846    Subjective Today is a good day, has a small soreness in the usual spot on his knee   How long can you walk comfortably? able to walk without increased knee pain, stairs still bother him however is improving, 75% better   Currently in Pain? Yes   Pain Score 2    Pain Location Knee   Pain Orientation Left   Pain Descriptors / Indicators Sore   Pain Type Acute pain   Pain Frequency Intermittent   Aggravating Factors  deep squating and stairs   Pain Relieving Factors exercise            [facility] PT Assessment - 04/24/15 0001    Assessment   Medical Diagnosis Lt knee pain   Onset Date/Surgical Date 03/23/15   Hand Dominance Right   Next MD Visit 05/05/15   AROM   Left Knee Flexion 145   Strength   Overall Strength Comments patient presents with eccentric Lt quad weakness   Left Knee Extension 5/5  no more pain                     [facility] Adult PT Treatment/Exercise - 04/24/15 0001    Knee/Hip Exercises: Stretches   Active Hamstring Stretch Left;2 reps;30 seconds  with strap   Quad Stretch Left;3 reps;30 seconds  prone  with strap   Knee/Hip Exercises: Aerobic   Elliptical L3 x 5'   Knee/Hip Exercises: Standing   Step Down 3 sets;10 reps;Step Height: 2";Left  with HHA, some tightness   SLS Lt, 3x10 toe taps Rt FWD, side and back   Other Standing Knee Exercises 2x8 eccentric stand to sit with Lt LE only   Knee/Hip Exercises: Sidelying   Hip ADduction Strengthening;Left;3 sets;15 reps   Clams Lt 3x15   Knee/Hip Exercises: Prone   Hamstring Curl 3 sets;10 reps   Hamstring Curl Limitations 3#   Hip Extension Left;3 sets;10 reps   Hip Extension Limitations 3#   Ultrasound   Ultrasound Location distal quad/lateral superior Lt patella   Ultrasound Parameters 3.51mz, 100%, 1/0w/cm2   Ultrasound Goals Pain   Iontophoresis   Type of Iontophoresis Dexamethasone   Location superior pole patella   Dose 1.0 cc; 80 mA*min   Time 6 hour patch                  PT Short Term Goals - 04/24/15 0914    PT SHORT TERM GOAL #1   Status Achieved   PT SHORT TERM GOAL #2   Title Increaese AROM Lt knee to 130  degrees flexion 04/28/15   Status Achieved   PT SHORT TERM GOAL #3   Title Wean from [place] completely 04/18/15   Status Achieved   PT SHORT TERM GOAL #4   Title Normal gait pattern 04/25/15  on even surfaces   Status Achieved           PT Long Term Goals - 04/24/15 0914    PT LONG TERM GOAL #1   Title Patient I in HEP for discharge - 05/16/15   Status On-going   PT LONG TERM GOAL #2   Title Full painfree ROM Lt knee 05/16/15   Status Achieved   PT LONG TERM GOAL #3   Title Decresae pain to 0-2/10 05/16/15   Status On-going   PT LONG TERM GOAL #4   Title Decrease FOTO to </= 32% limitation   Status On-going               Plan - 04/24/15 0927    Clinical Impression Statement Pt continues to have increased Lt knee ROM without pain,  his strength is improving however he has fair eccentric Lt quad control with higher level activity. Lateral tracking of the Lt patella  is significant.  He has met all his STGs and progressing to the LTGs.  HAs the second ionto patch today.    Pt will benefit from skilled therapeutic intervention in order to improve on the following deficits Abnormal gait;Decreased range of motion;Difficulty walking;Decreased activity tolerance;Pain;Impaired flexibility;Improper body mechanics;Decreased mobility   Rehab Potential Excellent   PT Frequency 2x / week   PT Duration 6 weeks   PT Treatment/Interventions ADLs/Self Care Home Management;Cryotherapy;Electrical Stimulation;Iontophoresis 72m/ml Dexamethasone;Moist Heat;Ultrasound;Gait training;Functional mobility training;Therapeutic activities;Therapeutic exercise;Balance training;Neuromuscular re-education;Patient/family education;Manual techniques;Passive range of motion   PT Next Visit Plan continue with higher level ther ex and eccentric Lt quad work.    Consulted and Agree with Plan of Care Patient        Problem List Patient Active Problem List   Diagnosis Date Noted  . Calcific tendinitis of left Quadriceps insertion 03/27/2015  . Primary osteoarthritis of right hand 03/27/2015  . Hyperlipidemia 03/16/2015  . Dupuytren's contracture of right hand 03/10/2015  . 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 Annual physical exam 03/10/2015  . 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    SJeral Pinch PT 04/24/2015, 9:32 AM  [facility] [address]Phone: [phone]  Fax:  [phone]

## 2015-04-25 ENCOUNTER — Ambulatory Visit (INDEPENDENT_AMBULATORY_CARE_PROVIDER_SITE_OTHER): Payer: Managed Care, Other (non HMO) | Admitting: Sports Medicine

## 2015-04-25 ENCOUNTER — Encounter: Payer: Self-pay | Admitting: Sports Medicine

## 2015-04-25 VITALS — BP 138/94 | HR 89 | Ht 66.0 in | Wt 167.0 lb

## 2015-04-25 DIAGNOSIS — M65252 Calcific tendinitis, left thigh: Secondary | ICD-10-CM | POA: Diagnosis not present

## 2015-04-25 NOTE — Assessment & Plan Note (Signed)
Continue physical therapy for another week and a half, disability forms filled out today, when he returns if he is no better we will get him set up for platelet rich plasma injection/percutaneous tenotomy.

## 2015-04-25 NOTE — Progress Notes (Signed)
  Subjective:    CC: Follow-up  HPI: Left quadriceps insertion of tendinopathy: Doing extremely well post Quad tendon injection, there was a fairly significant quadriceps tendon sheath effusion, we also placed some medication into the joint. He is almost completely better, he has another week and a half of physical therapy. He is here for his disability forms. He feels as though his limitation now has to do predominantly with strength.  Past medical history, Surgical history, Family history not pertinant except as noted below, Social history, Allergies, and medications have been entered into the medical record, reviewed, and no changes needed.   Review of Systems: No fevers, chills, night sweats, weight loss, chest pain, or shortness of breath.   Objective:    General: Well Developed, well nourished, and in no acute distress.  Neuro: Alert and oriented x3, extra-ocular muscles intact, sensation grossly intact.  HEENT: Normocephalic, atraumatic, pupils equal round reactive to light, neck supple, no masses, no lymphadenopathy, thyroid nonpalpable.  Skin: Warm and dry, no rashes. Cardiac: Regular rate and rhythm, no murmurs rubs or gallops, no lower extremity edema.  Respiratory: Clear to auscultation bilaterally. Not using accessory muscles, speaking in full sentences. Left Knee: Normal to inspection with no erythema or effusion or obvious bony abnormalities. Palpation normal with no warmth or joint line tenderness or patellar tenderness or condyle tenderness. ROM normal in flexion and extension and lower leg rotation. Ligaments with solid consistent endpoints including ACL, PCL, LCL, MCL. Negative Mcmurray's and provocative meniscal tests. Non painful patellar compression. Patellar and quadriceps tendons unremarkable. Hamstring and quadriceps strength is normal.  Impression and Recommendations:

## 2015-04-26 ENCOUNTER — Ambulatory Visit (INDEPENDENT_AMBULATORY_CARE_PROVIDER_SITE_OTHER): Payer: Managed Care, Other (non HMO) | Admitting: Physical Therapy

## 2015-04-26 DIAGNOSIS — Z7409 Other reduced mobility: Secondary | ICD-10-CM

## 2015-04-26 DIAGNOSIS — M25562 Pain in left knee: Secondary | ICD-10-CM

## 2015-04-26 NOTE — Therapy (Signed)
[facility] [place] [address] [place], [place], [address] Phone: [phone]   Fax:  [phone]  Physical Therapy Treatment  Patient Details  Name: Darren Mcdaniel MRN: [id number] Date of Birth: June 24, 1966 Referring Provider:  Monica Becton,*  Encounter Date: 04/26/2015      PT End of Session - 04/26/15 0839    Visit Number 7   Number of Visits 12   Date for PT Re-Evaluation 05/16/15   PT Start Time 0839   PT Stop Time 0926   PT Time Calculation (min) 47 min      No past medical history on file.  No past surgical history on file.  There were no vitals filed for this visit.  Visit Diagnosis:  Left knee pain  Decreased functional mobility and endurance      Subjective Assessment - 04/26/15 0839    Subjective Saw MD , sees him again 05/05/15.    Currently in Pain? Yes   Pain Score 1   or less   Pain Location Knee   Pain Orientation Left   Pain Descriptors / Indicators Dull   Pain Type Acute pain   Pain Onset More than a month ago   Pain Frequency Intermittent                         [facility] Adult PT Treatment/Exercise - 04/26/15 0001    Knee/Hip Exercises: Stretches   Active Hamstring Stretch Left;2 reps;30 seconds  with strap supine   Quad Stretch Left;2 reps;30 seconds  prone with strap   Other Knee/Hip Stretches ITB stretch with strap in supine Lt, 2 x 30 sec   Knee/Hip Exercises: Aerobic   Elliptical L3 x 5'   Knee/Hip Exercises: Machines for Strengthening   Cybex Knee Extension 1 plate, 6A63 up bilat, down Lt  fatigues, quad quivering   Knee/Hip Exercises: Standing   Rebounder Lt SLS on level ground and on blue therapad.    Knee/Hip Exercises: Supine   Bridges Limitations 3x10 , attempted single leg Lt, stopped due to pain increase   Straight Leg Raise with External Rotation Strengthening;Left;2 sets  8 reps in long sit with hip ab/adduction   Knee/Hip Exercises: Sidelying   Hip ADduction Strengthening;Left;3 sets;15 reps  with top foot on chair   Knee/Hip Exercises: Prone   Hamstring Curl 3 sets;10 reps   Hamstring Curl Limitations 4#   Ultrasound   Ultrasound Location distal quad, Lt superior patella   Ultrasound Parameters 3.28mhz, 1/0 w/cm2, 50%   Ultrasound Goals Pain   Iontophoresis   Type of Iontophoresis Dexamethasone   Location superior pole patella   Dose 1.0 cc; 80 mA*min   Time 6 hour patch                  PT Short Term Goals - 04/24/15 0914    PT SHORT TERM GOAL #1   Status Achieved   PT SHORT TERM GOAL #2   Title Increaese AROM Lt knee to 130 degrees flexion 04/28/15   Status Achieved   PT SHORT TERM GOAL #3   Title Wean from immobilizer Lt LE completely 04/18/15   Status Achieved   PT SHORT TERM GOAL #4   Title Normal gait pattern 04/25/15  on even surfaces   Status Achieved           PT Long Term Goals - 04/24/15 0914    PT LONG TERM GOAL #1   Title Patient  I in HEP for discharge - 05/16/15   Status On-going   PT LONG TERM GOAL #2   Title Full painfree ROM Lt knee 05/16/15   Status Achieved   PT LONG TERM GOAL #3   Title Decresae pain to 0-2/10 05/16/15   Status On-going   PT LONG TERM GOAL #4   Title Decrease FOTO to </= 32% limitation   Status On-going               Plan - 04/26/15 0926    Clinical Impression Statement Pt with decreasing pain in the Lt knee.  Continues with functional Lt quad weakness/fatigue with activity.     Pt will benefit from skilled therapeutic intervention in order to improve on the following deficits Abnormal gait;Decreased range of motion;Difficulty walking;Decreased activity tolerance;Pain;Impaired flexibility;Improper body mechanics;Decreased mobility   Rehab Potential Excellent   PT Frequency 2x / week   PT Duration 6 weeks   PT Treatment/Interventions ADLs/Self Care Home Management;Cryotherapy;Electrical Stimulation;Iontophoresis 4mg /ml Dexamethasone;Moist  Heat;Ultrasound;Gait training;Functional mobility training;Therapeutic activities;Therapeutic exercise;Balance training;Neuromuscular re-education;Patient/family education;Manual techniques;Passive range of motion   PT Next Visit Plan continue with higher level ther ex and eccentric Lt quad work. 3 more ionto txs   Consulted and Agree with Plan of Care Patient        Problem List Patient Active Problem List   Diagnosis Date Noted  . Calcific tendinitis of left Quadriceps insertion 03/27/2015  . Primary osteoarthritis of right hand 03/27/2015  . Hyperlipidemia 03/16/2015  . Dupuytren's contracture of right hand 03/10/2015  . 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 Annual physical exam 03/10/2015  . 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    Roderic ScarceSusan Shaver PT 04/26/2015, 9:28 AM  [facility] [address] [place], [place], [address] Phone: [phone]   Fax:  [phone]

## 2015-05-02 ENCOUNTER — Encounter: Payer: Self-pay | Admitting: Rehabilitative and Restorative Service Providers"

## 2015-05-02 ENCOUNTER — Ambulatory Visit (INDEPENDENT_AMBULATORY_CARE_PROVIDER_SITE_OTHER): Payer: Managed Care, Other (non HMO) | Admitting: Rehabilitative and Restorative Service Providers"

## 2015-05-02 DIAGNOSIS — M25562 Pain in left knee: Secondary | ICD-10-CM

## 2015-05-02 DIAGNOSIS — M25662 Stiffness of left knee, not elsewhere classified: Secondary | ICD-10-CM | POA: Diagnosis not present

## 2015-05-02 DIAGNOSIS — Z7409 Other reduced mobility: Secondary | ICD-10-CM | POA: Diagnosis not present

## 2015-05-02 NOTE — Therapy (Signed)
[facility] [address] [place], [place], [address] Phone: [phone]   Fax:  [phone]  Physical Therapy Treatment  Patient Details  Name: Darren Mcdaniel MRN: [id number] Date of Birth: 09/29/1966 Referring Provider:  Monica Bectonhekkekandam, Thomas J,*  Encounter Date: 05/02/2015      PT End of Session - 05/02/15 0940    Visit Number 8   Number of Visits 12   Date for PT Re-Evaluation 05/16/15   PT Start Time 0842   PT Stop Time 0933   PT Time Calculation (min) 51 min   Activity Tolerance Patient tolerated treatment well      History reviewed. No pertinent past medical history.  History reviewed. No pertinent past surgical history.  There were no vitals filed for this visit.  Visit Diagnosis:  Left knee pain  Decreased functional mobility and endurance  Stiffness of knee joint, left      Subjective Assessment - 05/02/15 0848    Subjective Continued progreess. Knot in the quad is almost gone. No pain.   Currently in Pain? No/denies           [facility]Exercise - 05/02/15 0001    Knee/Hip Exercises: Stretches   Active Hamstring Stretch Left;2 reps;30 seconds  with strap supine   Quad Stretch Left;2 reps;30 seconds  prone with strap   Other Knee/Hip Stretches ITB stretch with strap in supine Lt, 2 x 30 sec   Knee/Hip Exercises: Aerobic   Elliptical L4 x 5'   Knee/Hip Exercises: Machines for Strengthening   Cybex Knee Extension 2 plates, [id number] up bilat, down Lt  fatigues, quad quivering   Knee/Hip Exercises: Standing   Rebounder Lt SLS on level ground and on blue therapad.   vector and straight   Other Standing Knee Exercises trampoline bounce/run 1 min- 2 min x2   Knee/Hip Exercises: Prone   Hamstring Curl 3 sets;10 reps   Hamstring Curl Limitations 4#   Ultrasound   Ultrasound Location distal quad   Ultrasound Parameters 3.803mHz   Ultrasound Goals Pain   Iontophoresis   Type of  Iontophoresis Dexamethasone   Location superior pole patella   Dose 1.0 cc; 80 mA*min   Time 6 hour patch           PT Education - 05/02/15 0940    Education provided Yes   Education Details Encouraged patient to increase walking at home in prep for RTW   Person(s) Educated Patient   Methods Explanation   Comprehension Verbalized understanding          PT Short Term Goals - 04/24/15 0914    PT SHORT TERM GOAL #1   Status Achieved   PT SHORT TERM GOAL #2   Title Increaese AROM Lt knee to 130 degrees flexion 04/28/15   Status Achieved   PT SHORT TERM GOAL #3   Title Wean from immobilizer Lt LE completely 04/18/15   Status Achieved   PT SHORT TERM GOAL #4   Title Normal gait pattern 04/25/15  on even surfaces   Status Achieved           PT Long Term Goals - 05/02/15 0944    PT LONG TERM GOAL #3   Title Decresae pain to 0-2/10 05/16/15   Time 6   Period Weeks   Status Achieved           Plan - 05/02/15 0941    Clinical Impression Statement Continued improvement with decreased pain and less marked  area of tenderness and tightness in Lt quad/proximal patella; improving exercise tolerance with strengthening program   Pt will benefit from skilled therapeutic intervention in order to improve on the following deficits Abnormal gait;Decreased range of motion;Difficulty walking;Decreased activity tolerance;Pain;Impaired flexibility;Improper body mechanics;Decreased mobility   Rehab Potential Excellent   PT Frequency 2x / week   PT Duration 6 weeks   PT Treatment/Interventions ADLs/Self Care Home Management;Cryotherapy;Electrical Stimulation;Iontophoresis /ml Dexamethasone;Moist Heat;Ultrasound;Gait training;Functional mobility training;Therapeutic activities;Therapeutic exercise;Balance training;Neuromuscular re-education;Patient/family education;Manual techniques;Passive range of motion   PT Next Visit Plan continue with higher level ther ex and eccentric Lt quad  work.  ionto    PT Home Exercise Plan HEP - increase walking to prepare for RTW   Consulted and Agree with Plan of Care Patient        Problem List Patient Active Problem List   Diagnosis Date Noted  . Calcific tendinitis of left Quadriceps insertion 03/27/2015  . Primary osteoarthritis of right hand 03/27/2015  . Hyperlipidemia 03/16/2015  . Dupuytren's contracture of right hand 03/10/2015  . 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 Annual physical exam 03/10/2015  . 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    Darren Mcdaniel, PT, MPH 05/02/2015, 9:45 AM  [facility] [address] [place], [place], [address] Phone: [phone]   Fax:  [phone]

## 2015-05-05 ENCOUNTER — Ambulatory Visit (INDEPENDENT_AMBULATORY_CARE_PROVIDER_SITE_OTHER): Payer: Managed Care, Other (non HMO) | Admitting: Physical Therapy

## 2015-05-05 ENCOUNTER — Encounter: Payer: Self-pay | Admitting: Physical Therapy

## 2015-05-05 ENCOUNTER — Encounter: Payer: Self-pay | Admitting: Sports Medicine

## 2015-05-05 ENCOUNTER — Ambulatory Visit (INDEPENDENT_AMBULATORY_CARE_PROVIDER_SITE_OTHER): Payer: Managed Care, Other (non HMO) | Admitting: Sports Medicine

## 2015-05-05 VITALS — BP 142/91 | HR 88 | Ht 66.0 in | Wt 168.0 lb

## 2015-05-05 DIAGNOSIS — Z7409 Other reduced mobility: Secondary | ICD-10-CM

## 2015-05-05 DIAGNOSIS — M65252 Calcific tendinitis, left thigh: Secondary | ICD-10-CM

## 2015-05-05 NOTE — Patient Instructions (Signed)
Balance / Reach  START THIS ONE WHEN THE FORWARD LEAN IS EASY   Stand on left foot, Holding __0__ pound weight in other hand. Bend knee, lowering body, and reach across. Hold _1___ seconds. Relax. Repeat _10___ times per set. Do _2-3_ sets per session. Do __1__ sessions per day.  [email or web address]://[email or web address].[email or web address]  Balance: Unilateral - Forward Lean   Stand on left foot, hands on hips. Keeping hips level, bend forward as if to touch forehead to wall. Hold __1__ seconds. Relax. Repeat __10__ times per set. Do _2-3___ sets per session. Do __1__ sessions per day.  http://orth.[email or web address].us/88   Copyright  VHI. All rights reserved.

## 2015-05-05 NOTE — Progress Notes (Signed)
  Subjective:    CC: Follow-up  HPI: Left distal quadrant tendinitis: Pain-free now.  Past medical history, Surgical history, Family history not pertinant except as noted below, Social history, Allergies, and medications have been entered into the medical record, reviewed, and no changes needed.   Review of Systems: No fevers, chills, night sweats, weight loss, chest pain, or shortness of breath.   Objective:    General: Well Developed, well nourished, and in no acute distress.  Neuro: Alert and oriented x3, extra-ocular muscles intact, sensation grossly intact.  HEENT: Normocephalic, atraumatic, pupils equal round reactive to light, neck supple, no masses, no lymphadenopathy, thyroid nonpalpable.  Skin: Warm and dry, no rashes. Cardiac: Regular rate and rhythm, no murmurs rubs or gallops, no lower extremity edema.  Respiratory: Clear to auscultation bilaterally. Not using accessory muscles, speaking in full sentences. Left Knee: Normal to inspection with no erythema or effusion or obvious bony abnormalities. Palpation normal with no warmth or joint line tenderness or patellar tenderness or condyle tenderness. ROM normal in flexion and extension and lower leg rotation. Ligaments with solid consistent endpoints including ACL, PCL, LCL, MCL. Negative Mcmurray's and provocative meniscal tests. Non painful patellar compression. Patellar and quadriceps tendons unremarkable. Hamstring and quadriceps strength is normal.  Impression and Recommendations:

## 2015-05-05 NOTE — Therapy (Signed)
[facility] [place], [place], [address] Phone: [phone]   Fax:  [phone]  Physical Therapy Treatment  Patient Details  Name: Darren Mcdaniel MRN: [id number] Date of Birth: December 07, 1965 Referring Provider:  Silverio Decamp,*  Encounter Date: 05/05/2015      PT End of Session - 05/05/15 0806    Visit Number 9   Number of Visits 12   Date for PT Re-Evaluation 05/16/15   PT Start Time 0804   PT Stop Time 0845   PT Time Calculation (min) 41 min   Activity Tolerance Patient tolerated treatment well      History reviewed. No pertinent past medical history.  History reviewed. No pertinent past surgical history.  There were no vitals filed for this visit.  Visit Diagnosis:  Decreased functional mobility and endurance      Subjective Assessment - 05/05/15 0808    Subjective Pt reports he sees the doctor today, is hoping to be cleared and not need to come back to PT. Reports he feels he has enough exerercise to keep progressing.    Currently in Pain? No/denies            [facility] PT Assessment - 05/05/15 0001    Assessment   Medical Diagnosis Lt knee pain   Onset Date/Surgical Date 03/23/15   Hand Dominance Right   Next MD Visit 05/05/15   Strength   Left Knee Extension 5/5                     [facility] Adult PT Treatment/Exercise - 05/05/15 0001    Knee/Hip Exercises: Aerobic   Elliptical L4 x 5'   Knee/Hip Exercises: Standing   Step Down Left;2 sets;10 reps;Step Height: 6"  VC's to keep knee aligned   SLS 2x10 FWD lean on Lt LE   Knee/Hip Exercises: Supine   Short Arc Quad Sets Strengthening;Left;3 sets;10 reps  7.5#   Bridges Limitations --  2x10 single leg Lt    Straight Leg Raise with External Rotation Strengthening;Left;3 sets;10 reps  with hip ab/adduction   Knee/Hip Exercises: Sidelying   Hip ADduction Strengthening;Left;3 sets;15 reps   Modalities   Modalities  Ultrasound;Iontophoresis   Ultrasound   Ultrasound Location distal Lt quad   Ultrasound Parameters 3.50mz, 50%, 1.0 w/cm2   Ultrasound Goals Pain   Iontophoresis   Type of Iontophoresis Dexamethasone   Location superior pole patella   Dose 1.0 cc; 80 mA*min   Time 6 hour patch                PT Education - 05/05/15 0826    Education provided Yes   Education Details HEP   Person(s) Educated Patient   Methods Explanation;Demonstration;Handout   Comprehension Returned demonstration          PT Short Term Goals - 04/24/15 0914    PT SHORT TERM GOAL #1   Status Achieved   PT SHORT TERM GOAL #2   Title Increaese AROM Lt knee to 130 degrees flexion 04/28/15   Status Achieved   PT SHORT TERM GOAL #3   Title [place]an from [place] 04/18/15   Status Achieved   PT SHORT TERM GOAL #4   Title Normal gait pattern 04/25/15  on even surfaces   Status Achieved           PT Long Term Goals - 05/05/15 0806    PT LONG TERM GOAL #1   Title Patient I  in HEP for discharge - 05/16/15   Status Achieved   PT LONG TERM GOAL #2   Title Full painfree ROM Lt knee 05/16/15   Status Achieved   PT LONG TERM GOAL #3   Title Decresae pain to 0-2/10 05/16/15   Status Achieved   PT LONG TERM GOAL #4   Title Decrease FOTO to </= 32% limitation  scored 19% limited   Status Achieved               Plan - 05/05/15 0845    Clinical Impression Statement Pt doing very well, has met all his goals.    PT Next Visit Plan D/C to HEP        Problem List Patient Active Problem List   Diagnosis Date Noted  . Calcific tendinitis of left Quadriceps insertion 03/27/2015  . Primary osteoarthritis of right hand 03/27/2015  . Hyperlipidemia 03/16/2015  . Dupuytren's contracture of right hand 03/10/2015  . 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 Annual physical exam 03/10/2015  . 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    Jeral Pinch, PT 05/05/2015,  8:46 AM  [facility] [place], [place], [address] Phone: [phone]   Fax:  [phone]     PHYSICAL THERAPY DISCHARGE SUMMARY  Visits from Start of Care: 9  Current functional level related to goals / functional outcomes: See above    Remaining deficits: All goals met   Education / Equipment: HEP Plan: Patient agrees to discharge.  Patient goals were met. Patient is being discharged due to meeting the stated rehab goals.  ?????    Jeral Pinch, PT 05/05/2015 8:50 AM

## 2015-05-05 NOTE — Assessment & Plan Note (Signed)
Doing extremely well post percutaneous tenotomy of calcific deposit in the left quadriceps insertion, formal physical therapy has been tremendously effective and he is now pain free and ready to return to work. One more week out, then may return without restrictions.

## 2015-05-16 ENCOUNTER — Encounter: Payer: Self-pay | Admitting: *Deleted

## 2015-05-25 ENCOUNTER — Encounter: Payer: Self-pay | Admitting: Sports Medicine

## 2015-08-11 ENCOUNTER — Ambulatory Visit (INDEPENDENT_AMBULATORY_CARE_PROVIDER_SITE_OTHER): Payer: Managed Care, Other (non HMO) | Admitting: Sports Medicine

## 2015-08-11 ENCOUNTER — Encounter: Payer: Self-pay | Admitting: Sports Medicine

## 2015-08-11 VITALS — BP 144/93 | HR 98 | Wt 174.0 lb

## 2015-08-11 DIAGNOSIS — M19041 Primary osteoarthritis, right hand: Secondary | ICD-10-CM | POA: Diagnosis not present

## 2015-08-11 DIAGNOSIS — M6588 Other synovitis and tenosynovitis, other site: Secondary | ICD-10-CM | POA: Diagnosis not present

## 2015-08-11 DIAGNOSIS — M65939 Unspecified synovitis and tenosynovitis, unspecified forearm: Secondary | ICD-10-CM

## 2015-08-11 DIAGNOSIS — E785 Hyperlipidemia, unspecified: Secondary | ICD-10-CM

## 2015-08-11 DIAGNOSIS — M659 Synovitis and tenosynovitis, unspecified: Secondary | ICD-10-CM

## 2015-08-11 LAB — COMPREHENSIVE METABOLIC PANEL WITH GFR
Alkaline Phosphatase: 81 U/L (ref 40–115)
BUN: 9 mg/dL (ref 7–25)
CO2: 24 mmol/L (ref 20–31)
Glucose, Bld: 84 mg/dL (ref 65–99)
Potassium: 4.2 mmol/L (ref 3.5–5.3)
Total Bilirubin: 1 mg/dL (ref 0.2–1.2)

## 2015-08-11 LAB — LIPID PANEL
Cholesterol: 264 mg/dL — ABNORMAL HIGH (ref 125–200)
HDL: 69 mg/dL (ref >=40)
LDL Cholesterol: 133 mg/dL — ABNORMAL HIGH (ref <130)
Total CHOL/HDL Ratio: 3.8 Ratio (ref <=5.0)
Triglycerides: 311 mg/dL — ABNORMAL HIGH (ref <150)
VLDL: 62 mg/dL — ABNORMAL HIGH (ref <30)

## 2015-08-11 LAB — COMPREHENSIVE METABOLIC PANEL
ALT: 30 U/L (ref 9–46)
AST: 29 U/L (ref 10–40)
Albumin: 4.3 g/dL (ref 3.6–5.1)
Calcium: 9.4 mg/dL (ref 8.6–10.3)
Chloride: 101 mmol/L (ref 98–110)
Creat: 0.86 mg/dL (ref 0.60–1.35)
Sodium: 140 mmol/L (ref 135–146)
Total Protein: 6.9 g/dL (ref 6.1–8.1)

## 2015-08-11 LAB — CBC
HCT: 44.6 % (ref 39.0–52.0)
Hemoglobin: 15.3 g/dL (ref 13.0–17.0)
MCH: 30.2 pg (ref 26.0–34.0)
MCHC: 34.3 g/dL (ref 30.0–36.0)
MCV: 88 fL (ref 78.0–100.0)
MPV: 10.6 fL (ref 8.6–12.4)
Platelets: 231 K/uL (ref 150–400)
RBC: 5.07 MIL/uL (ref 4.22–5.81)
RDW: 12.4 % (ref 11.5–15.5)
WBC: 9.1 10*3/uL (ref 4.0–10.5)

## 2015-08-11 LAB — HEMOGLOBIN A1C
Hgb A1c MFr Bld: 5.4 % (ref <5.7)
Mean Plasma Glucose: 108 mg/dL (ref <117)

## 2015-08-11 LAB — TSH: TSH: 1.568 u[IU]/mL (ref 0.350–4.500)

## 2015-08-11 NOTE — Assessment & Plan Note (Signed)
Five-month response to previous third metacarpal phalangeal joint injection, repeated today.

## 2015-08-11 NOTE — Assessment & Plan Note (Signed)
Right flexor carpi radialis injection today, previous injection provided 5 months of response. He also has some pain at the first carpal metacarpal joints of this can be a future interventional target if he doesn't get sufficient response from the FCR injection.

## 2015-08-11 NOTE — Progress Notes (Signed)
  Subjective:    CC: Follow-up and return of hand pain  HPI: Darren Mcdaniel is a pleasant 49 year old male, ejection into his droop trans-contracture, right third MCP, as well as flexor carpi radialis tendon sheath approximately 5 months ago. He desires repeat.  Hyperlipidemia: Due for recheck, has done naproxen only 5 months of dietary modification.  Right third metacarpal phalangeal joint osteoarthritis: Desires injection.  Right flexor carpi radialis tenosynovitis, desires injection. Pain is moderate, persistent without radiation.  Past medical history, Surgical history, Family history not pertinant except as noted below, Social history, Allergies, and medications have been entered into the medical record, reviewed, and no changes needed.   Review of Systems: No fevers, chills, night sweats, weight loss, chest pain, or shortness of breath.   Objective:    General: Well Developed, well nourished, and in no acute distress.  Neuro: Alert and oriented x3, extra-ocular muscles intact, sensation grossly intact.  HEENT: Normocephalic, atraumatic, pupils equal round reactive to light, neck supple, no masses, no lymphadenopathy, thyroid nonpalpable.  Skin: Warm and dry, no rashes. Cardiac: Regular rate and rhythm, no murmurs rubs or gallops, no lower extremity edema.  Respiratory: Clear to auscultation bilaterally. Not using accessory muscles, speaking in full sentences.  Procedure: Real-time Ultrasound Guided Injection of right third MCP Device: GE Logiq E  Verbal informed consent obtained.  Time-out conducted.  Noted no overlying erythema, induration, or other signs of local infection.  Skin prepped in a sterile fashion.  Local anesthesia: Topical Ethyl chloride.  With sterile technique and under real time ultrasound guidance:  0.5 mL kenalog 40, 0.5 mL lidocaine injected easily Completed without difficulty  Pain immediately resolved suggesting accurate placement of the medication.  Advised  to call if fevers/chills, erythema, induration, drainage, or persistent bleeding.  Images permanently stored and available for review in the ultrasound unit.  Impression: Technically successful ultrasound guided injection.  Procedure: Real-time Ultrasound Guided Injection of right flexor carpi radialis tendon sheath Device: GE Logiq E  Verbal informed consent obtained.  Time-out conducted.  Noted no overlying erythema, induration, or other signs of local infection.  Skin prepped in a sterile fashion.  Local anesthesia: Topical Ethyl chloride.  With sterile technique and under real time ultrasound guidance:  Using a 25-gauge 1-1/2 inch needle taking care to avoid the radial artery or its volar branch, I injected 1 mL Kenalog 40, 1 mL lidocaine into the flexor carpi radialis tendon sheath. Completed without difficulty  Pain immediately resolved suggesting accurate placement of the medication.  Advised to call if fevers/chills, erythema, induration, drainage, or persistent bleeding.  Images permanently stored and available for review in the ultrasound unit.  Impression: Technically successful ultrasound guided injection.  Impression and Recommendations:

## 2015-08-11 NOTE — Assessment & Plan Note (Signed)
Previous hyperlipidemia and 4 months of dietary modification, we can recheck lipids, and we'll probably start low-dose atorvastatin if still elevated.

## 2015-12-12 ENCOUNTER — Ambulatory Visit (INDEPENDENT_AMBULATORY_CARE_PROVIDER_SITE_OTHER): Payer: Managed Care, Other (non HMO) | Admitting: Sports Medicine

## 2015-12-12 ENCOUNTER — Encounter: Payer: Self-pay | Admitting: Sports Medicine

## 2015-12-12 VITALS — BP 129/92 | HR 90 | Resp 18 | Wt 171.0 lb

## 2015-12-12 DIAGNOSIS — M778 Other enthesopathies, not elsewhere classified: Secondary | ICD-10-CM | POA: Diagnosis not present

## 2015-12-12 DIAGNOSIS — M19041 Primary osteoarthritis, right hand: Secondary | ICD-10-CM | POA: Diagnosis not present

## 2015-12-12 DIAGNOSIS — M779 Enthesopathy, unspecified: Secondary | ICD-10-CM

## 2015-12-12 NOTE — Progress Notes (Signed)
  Subjective:    CC: hand and elbow pain  HPI: Right hand osteoarthritis: Third metacarpal phalangeal joint, has done well the past 4 months after previous injection, desires repeat interventional treatment today.  Right elbow pain: Present for a few weeks, localized proximal to the olecranon, worse with resisted extension. Moderate, worsening.  Past medical history, Surgical history, Family history not pertinant except as noted below, Social history, Allergies, and medications have been entered into the medical record, reviewed, and no changes needed.   Review of Systems: No fevers, chills, night sweats, weight loss, chest pain, or shortness of breath.   Objective:    General: Well Developed, well nourished, and in no acute distress.  Neuro: Alert and oriented x3, extra-ocular muscles intact, sensation grossly intact.  HEENT: Normocephalic, atraumatic, pupils equal round reactive to light, neck supple, no masses, no lymphadenopathy, thyroid nonpalpable.  Skin: Warm and dry, no rashes. Cardiac: Regular rate and rhythm, no murmurs rubs or gallops, no lower extremity edema.  Respiratory: Clear to auscultation bilaterally. Not using accessory muscles, speaking in full sentences. Right hand: Visible and palpable swelling as well as synovitis of the third MCP. Right Elbow: Unremarkable to inspection. Range of motion full pronation, supination, flexion, extension. Strength is full to all of the above directions Stable to varus, valgus stress. Negative moving valgus stress test. Exquisitely tender to palpation at the distal quadriceps insertion of the olecranon, reproduction pain with resisted elbow extension Ulnar nerve does not sublux. Negative cubital tunnel Tinel's.  Procedure: Real-time Ultrasound Guided Injection of right third MCP Device: GE Logiq E  Verbal informed consent obtained.  Time-out conducted.  Noted no overlying erythema, induration, or other signs of local infection.    Skin prepped in a sterile fashion.  Local anesthesia: Topical Ethyl chloride.  With sterile technique and under real time ultrasound guidance:  1/2 mL kenalog 40, 1/2 mL lidocaine injected easily with a 30-gauge needle. Completed without difficulty  Pain immediately resolved suggesting accurate placement of the medication.  Advised to call if fevers/chills, erythema, induration, drainage, or persistent bleeding.  Images permanently stored and available for review in the ultrasound unit.  Impression: Technically successful ultrasound guided injection.  Impression and Recommendations:

## 2015-12-12 NOTE — Assessment & Plan Note (Signed)
We will start with rehabilitation exercises and icing, he will return to see me on Friday for an injection if not feeling any better. We are doing the injection today because he does have heavy work for the entire week

## 2015-12-12 NOTE — Assessment & Plan Note (Signed)
Four-month response to previous injection, repeatright third metacarpophalangeal joint injection as above.

## 2015-12-15 ENCOUNTER — Encounter: Payer: Self-pay | Admitting: Sports Medicine

## 2015-12-15 ENCOUNTER — Ambulatory Visit (INDEPENDENT_AMBULATORY_CARE_PROVIDER_SITE_OTHER): Payer: Managed Care, Other (non HMO) | Admitting: Sports Medicine

## 2015-12-15 VITALS — BP 130/83 | HR 87 | Wt 169.0 lb

## 2015-12-15 DIAGNOSIS — M779 Enthesopathy, unspecified: Principal | ICD-10-CM

## 2015-12-15 DIAGNOSIS — M778 Other enthesopathies, not elsewhere classified: Secondary | ICD-10-CM

## 2015-12-15 NOTE — Progress Notes (Signed)
  Subjective:    CC: Follow-up  HPI: Right elbow pain: And diagnosed even with triceps tendinitis sometime ago, he started conservative measures but unfortunately he has continued to have significant pain limiting his work. He is here desiring interventional treatment. Pain is moderate, persistent and localized at the distal triceps insertion.  Past medical history, Surgical history, Family history not pertinant except as noted below, Social history, Allergies, and medications have been entered into the medical record, reviewed, and no changes needed.   Review of Systems: No fevers, chills, night sweats, weight loss, chest pain, or shortness of breath.   Objective:    General: Well Developed, well nourished, and in no acute distress.  Neuro: Alert and oriented x3, extra-ocular muscles intact, sensation grossly intact.  HEENT: Normocephalic, atraumatic, pupils equal round reactive to light, neck supple, no masses, no lymphadenopathy, thyroid nonpalpable.  Skin: Warm and dry, no rashes. Cardiac: Regular rate and rhythm, no murmurs rubs or gallops, no lower extremity edema.  Respiratory: Clear to auscultation bilaterally. Not using accessory muscles, speaking in full sentences.  Procedure: Real-time Ultrasound Guided Injection of right distal triceps insertion Device: GE Logiq E  Verbal informed consent obtained.  Time-out conducted.  Noted no overlying erythema, induration, or other signs of local infection.  Skin prepped in a sterile fashion.  Local anesthesia: Topical Ethyl chloride.  With sterile technique and under real time ultrasound guidance:  Noted in for tenderness calcifications and heterogeneity of the distal triceps tendon at its insertion to the olecranon, 25-gauge needle advanced and medication injected both superficial to and deep to the distal triceps tendon, a total of 1 mL Kenalog 40, 2 mL lidocaine, 2 mL Marcaine injected. Completed without difficulty  Pain immediately  resolved suggesting accurate placement of the medication.  Advised to call if fevers/chills, erythema, induration, drainage, or persistent bleeding.  Images permanently stored and available for review in the ultrasound unit.  Impression: Technically successful ultrasound guided injection.  Impression and Recommendations:

## 2015-12-15 NOTE — Assessment & Plan Note (Signed)
Distal triceps injection as above, there was evidence for severe insertional tendinopathy with intrasubstance calcifications. I performed somewhat of a mild percutaneous tenotomy. Avoid all strenuous activity for the next week. Return to see me in one month. If no better we will get an MRI, and he would be a candidate for PRP. No evidence of olecranon bursitis on ultrasound.

## 2015-12-25 ENCOUNTER — Emergency Department (INDEPENDENT_AMBULATORY_CARE_PROVIDER_SITE_OTHER)
Admission: EM | Admit: 2015-12-25 | Discharge: 2015-12-25 | Disposition: A | Discharge status: Home / Self Care | Payer: Managed Care, Other (non HMO) | Source: Home / Self Care | Attending: Family Medicine | Admitting: Family Medicine

## 2015-12-25 ENCOUNTER — Encounter: Payer: Self-pay | Admitting: *Deleted

## 2015-12-25 DIAGNOSIS — H6642 Suppurative otitis media, unspecified, left ear: Secondary | ICD-10-CM

## 2015-12-25 DIAGNOSIS — B9789 Other viral agents as the cause of diseases classified elsewhere: Principal | ICD-10-CM

## 2015-12-25 DIAGNOSIS — J069 Acute upper respiratory infection, unspecified: Secondary | ICD-10-CM

## 2015-12-25 MED ORDER — AMOXICILLIN 875 MG PO TABS: 875.0000 mg | ORAL_TABLET | Freq: Two times a day (BID) | ORAL | Status: DC

## 2015-12-25 MED ORDER — PREDNISONE 20 MG PO TABS: 20.0000 mg | ORAL_TABLET | Freq: Two times a day (BID) | ORAL | Status: DC

## 2015-12-25 MED ORDER — BENZONATATE 200 MG PO CAPS: 200.0000 mg | ORAL_CAPSULE | Freq: Every day | ORAL | Status: DC

## 2015-12-25 NOTE — ED Provider Notes (Signed)
CSN: [id number]     Arrival date & time 12/25/15  1317 History   First MD Initiated Contact with Patient 12/25/15 1339     Chief Complaint  Patient presents with  . Cough      HPI Comments: Patient complains of six day history of typical cold-like symptoms developing over several days,  including mild sore throat, sinus congestion, headache, fatigue, and cough.  Yesterday his symptoms became worse with chills/sweats, increased myalgias, increased cough, and left earache.  He states that his right ear feels chronically congested.  The history is provided by the patient.    History reviewed. No pertinent past medical history. History reviewed. No pertinent past surgical history. Family History  Problem Relation Age of Onset  . Diabetes Father   . Alcohol abuse Paternal Uncle    Social History  Substance Use Topics  . Smoking status: Never Smoker   . Smokeless tobacco: None  . Alcohol Use: 0.0 oz/week    0 Standard drinks or equivalent per week    Review of Systems + sore throat + cough No pleuritic pain No wheezing + nasal congestion + post-nasal drainage No sinus pain/pressure No itchy/red eyes + earache No hemoptysis No SOB + fever, + chills No nausea No vomiting No abdominal pain No diarrhea No urinary symptoms No skin rash + fatigue + myalgias + headache Used OTC meds without relief  Allergies  Review of patient's allergies indicates no known allergies.  Home Medications   Prior to Admission medications   Medication Sig Start Date End Date Taking? Authorizing Provider  amoxicillin (AMOXIL) 875 MG tablet Take 1 tablet (875 mg total) by mouth 2 (two) times daily. 12/25/15   Lattie HawStephen A Yina Riviere, MD  benzonatate (TESSALON) 200 MG capsule Take 1 capsule (200 mg total) by mouth at bedtime. Take as needed for cough 12/25/15   Lattie HawStephen A Javin Nong, MD  predniSONE (DELTASONE) 20 MG tablet Take 1 tablet (20 mg total) by mouth 2 (two) times daily. Take with food. 12/25/15    Lattie HawStephen A Vadis Slabach, MD   Meds Ordered and Administered this Visit  Medications - No data to display  BP 153/90 mmHg  Pulse 90  Temp(Src) 99.5 F (37.5 C) (Oral)  Resp 14  Ht 5\' 6"  (1.676 m)  Wt 166 lb (75.297 kg)  BMI 26.81 kg/m2  SpO2 99% No data found.   Physical Exam Nursing notes and Vital Signs reviewed. Appearance:  Patient appears stated age, and in no acute distress Eyes:  Pupils are equal, round, and reactive to light and accomodation.  Extraocular movement is intact.  Conjunctivae are not inflamed  Ears:  Canals normal.  Right tympanic membrane appears normal.  Left tympanic membrane erythematous with decreased landmarks.  Nose:  Congested turbinates.  No sinus tenderness.   Pharynx:  Normal Neck:  Supple.  Tender enlarged posterior nodes are palpated bilaterally  Lungs:  Clear to auscultation.  Breath sounds are equal.  Moving air well. Heart:  Regular rate and rhythm without murmurs, rubs, or gallops.  Abdomen:  Nontender without masses or hepatosplenomegaly.  Bowel sounds are present.  No CVA or flank tenderness.  Extremities:  No edema.  Skin:  No rash present.   ED Course  Procedures none       Labs Reviewed -  Tympanogram:  Left ear:  Wide.  Right ear:  "noisy" (otherwise wide)    MDM   1. Viral URI with cough   2. Suppurative otitis media of left ear without  spontaneous rupture of tympanic membrane, recurrence not specified, unspecified chronicity    Begin amoxicillin and prednisone burst.  Prescription written for Benzonatate (Tessalon) to take at bedtime for night-time cough.  Take plain guaifenesin (  extended release tabs such as Mucinex) twice daily, with plenty of water, for cough and congestion.  May add Pseudoephedrine ( , one or two every 4 to 6 hours) for sinus congestion.  Get adequate rest.   May use Afrin nasal spray (or generic oxymetazoline) twice daily for about 5 days and then discontinue.  Also recommend using saline nasal spray  several times daily and saline nasal irrigation (AYR is a common brand).  Use Flonase nasal spray each morning after using Afrin nasal spray and saline nasal irrigation. Try warm salt water gargles for sore throat.  Stop all antihistamines for now, and other non-prescription cough/cold preparations. Followup with ENT if not improved 10 days.    Lattie Haw, MD 12/25/15 1444

## 2015-12-25 NOTE — Discharge Instructions (Signed)
Take plain guaifenesin (1200mg extended release tabs such as Mucinex) twice daily, with plenty of water, for cough and congestion.  May add Pseudoephedrine (30mg, one or two every 4 to 6 hours) for sinus congestion.  Get adequate rest.   °May use Afrin nasal spray (or generic oxymetazoline) twice daily for about 5 days and then discontinue.  Also recommend using saline nasal spray several times daily and saline nasal irrigation (AYR is a common brand).  Use Flonase nasal spray each morning after using Afrin nasal spray and saline nasal irrigation. °Try warm salt water gargles for sore throat.  °Stop all antihistamines for now, and other non-prescription cough/cold preparations. °  °  °

## 2015-12-25 NOTE — ED Notes (Signed)
Pt c/o 5 days of productive cough, congestion and ear pain. Yesterday developed low grade fever, aches and sweats.

## 2015-12-28 ENCOUNTER — Ambulatory Visit (INDEPENDENT_AMBULATORY_CARE_PROVIDER_SITE_OTHER): Payer: Managed Care, Other (non HMO) | Admitting: Sports Medicine

## 2015-12-28 VITALS — BP 155/94 | HR 81 | Temp 98.4°F | Resp 18 | Wt 168.2 lb

## 2015-12-28 DIAGNOSIS — H6642 Suppurative otitis media, unspecified, left ear: Secondary | ICD-10-CM | POA: Diagnosis not present

## 2015-12-28 DIAGNOSIS — H6692 Otitis media, unspecified, left ear: Secondary | ICD-10-CM | POA: Insufficient documentation

## 2015-12-28 NOTE — Progress Notes (Signed)
  Subjective:    CC: follow-up  HPI: Left ear pain: Treated at urgent care for an ear infection, resolving.  Work status: Needs another couple days out of work.  Past medical history, Surgical history, Family history not pertinant except as noted below, Social history, Allergies, and medications have been entered into the medical record, reviewed, and no changes needed.   Review of Systems: No fevers, chills, night sweats, weight loss, chest pain, or shortness of breath.   Objective:    General: Well Developed, well nourished, and in no acute distress.  Neuro: Alert and oriented x3, extra-ocular muscles intact, sensation grossly intact.  HEENT: Normocephalic, atraumatic, pupils equal round reactive to light, neck supple, no masses, no lymphadenopathy, thyroid nonpalpable. Minimally erythematous left tympanic membrane Skin: Warm and dry, no rashes. Cardiac: Regular rate and rhythm, no murmurs rubs or gallops, no lower extremity edema.  Respiratory: Clear to auscultation bilaterally. Not using accessory muscles, speaking in full sentences.  Impression and Recommendations:

## 2015-12-28 NOTE — Assessment & Plan Note (Signed)
Already prescribed amoxicillin by another provider, left middle ear infection seems to be resolving.

## 2016-01-12 ENCOUNTER — Ambulatory Visit: Payer: Managed Care, Other (non HMO) | Admitting: Sports Medicine

## 2016-01-12 IMAGING — CR DG KNEE 1-2V*R*
4 series · 4 of 4 positions shown · non-contrast
Comparison: None.

CLINICAL DATA: Comparison with symptomatic left knee

EXAM:
RIGHT KNEE - 1-2 VIEW

[tunnel]
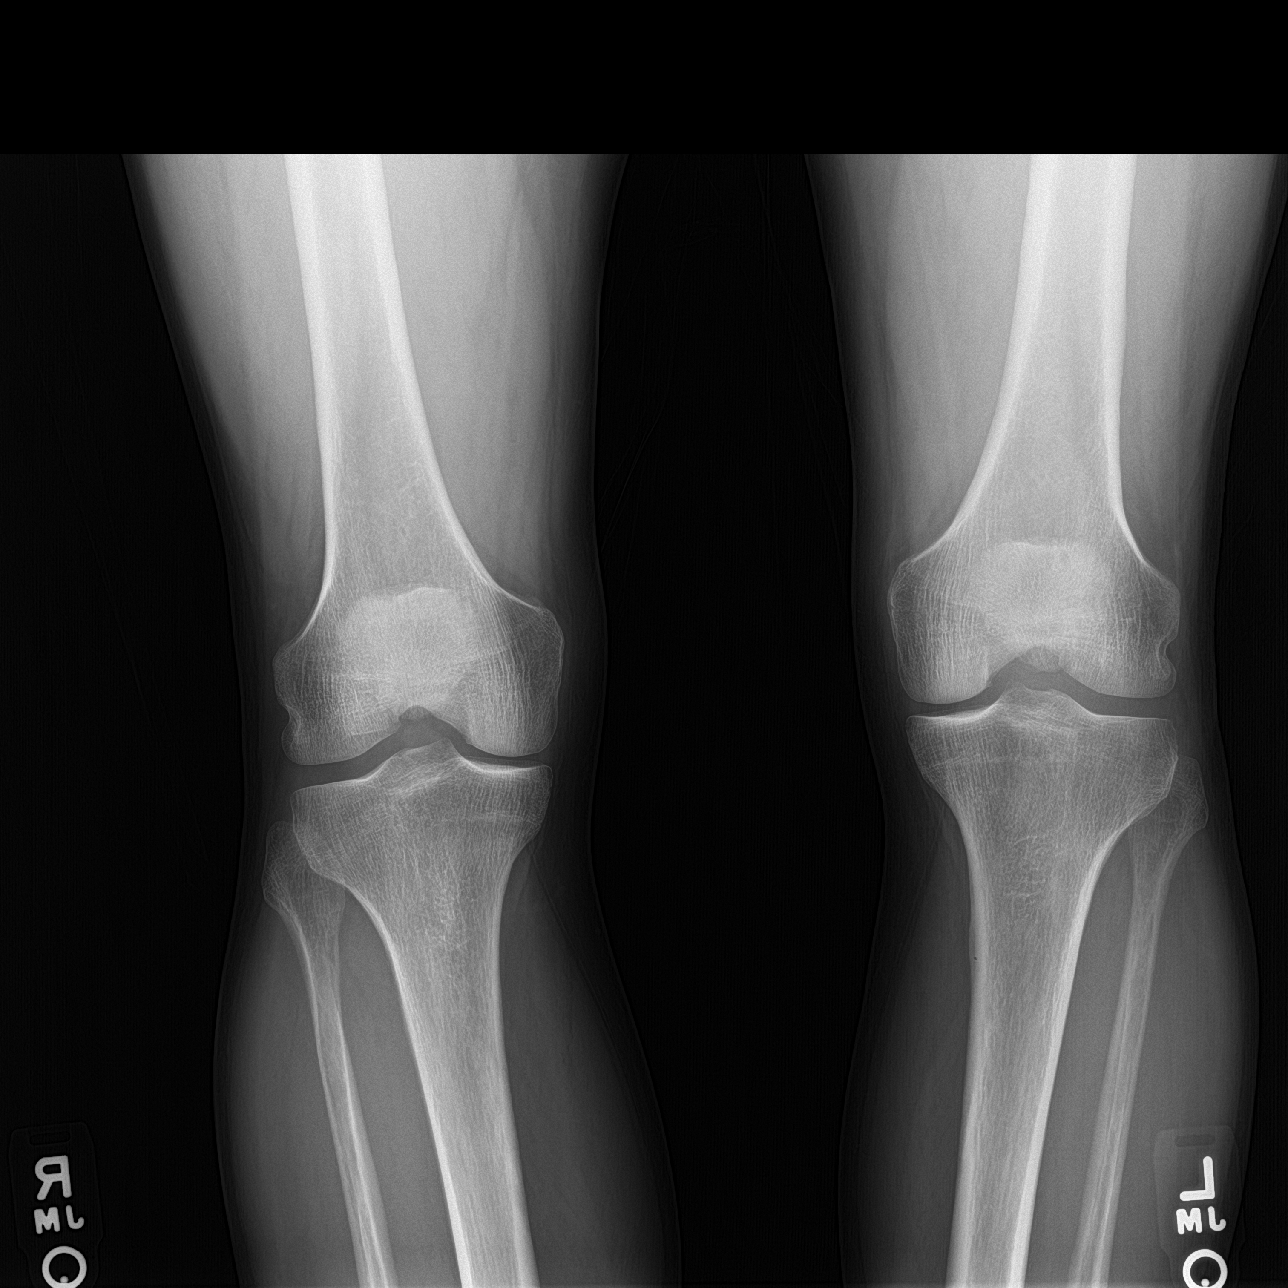

[knee lat]
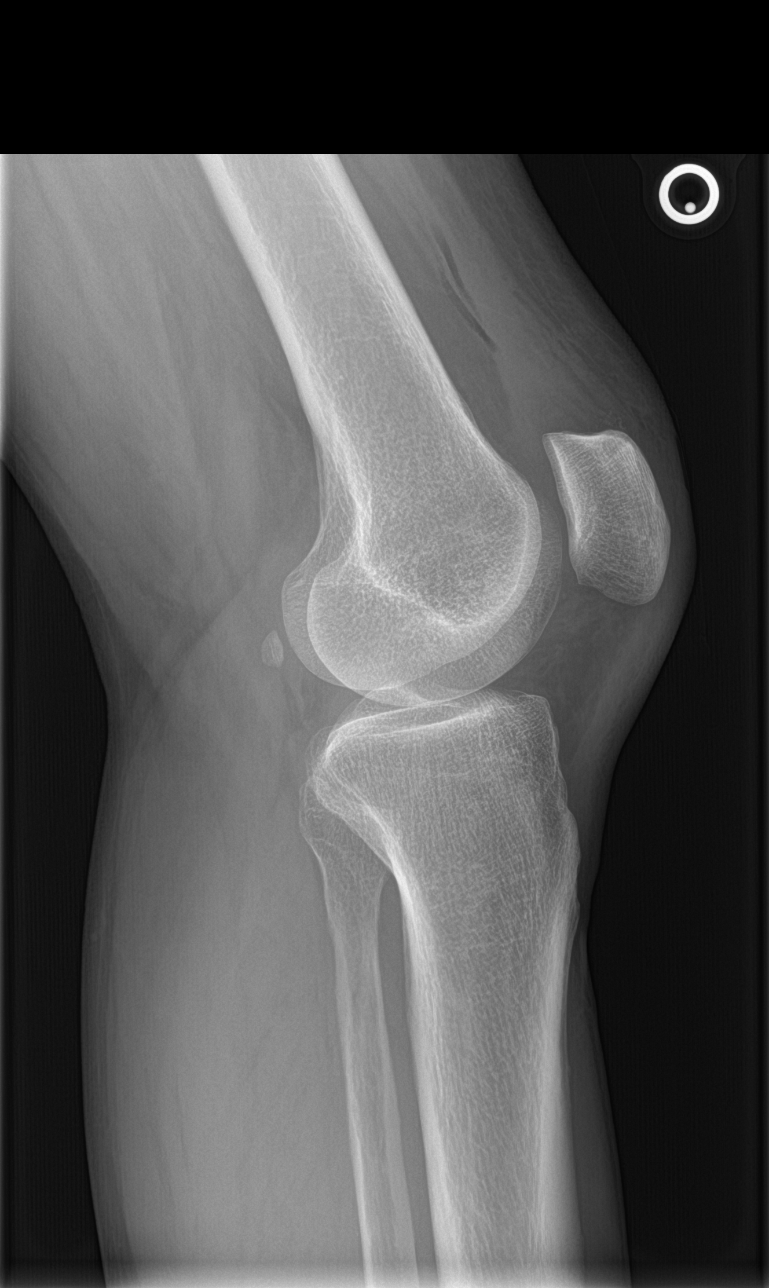

[knee sunrise]
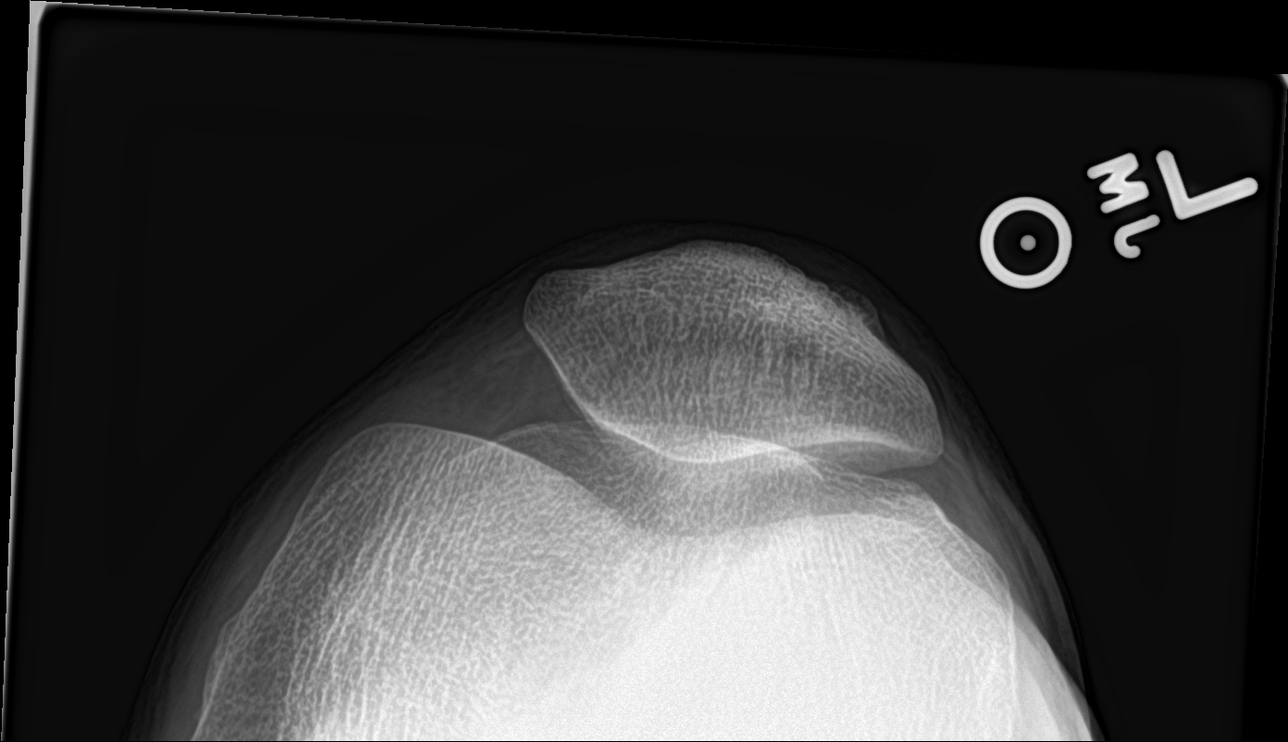

[knee ap bilat standing]
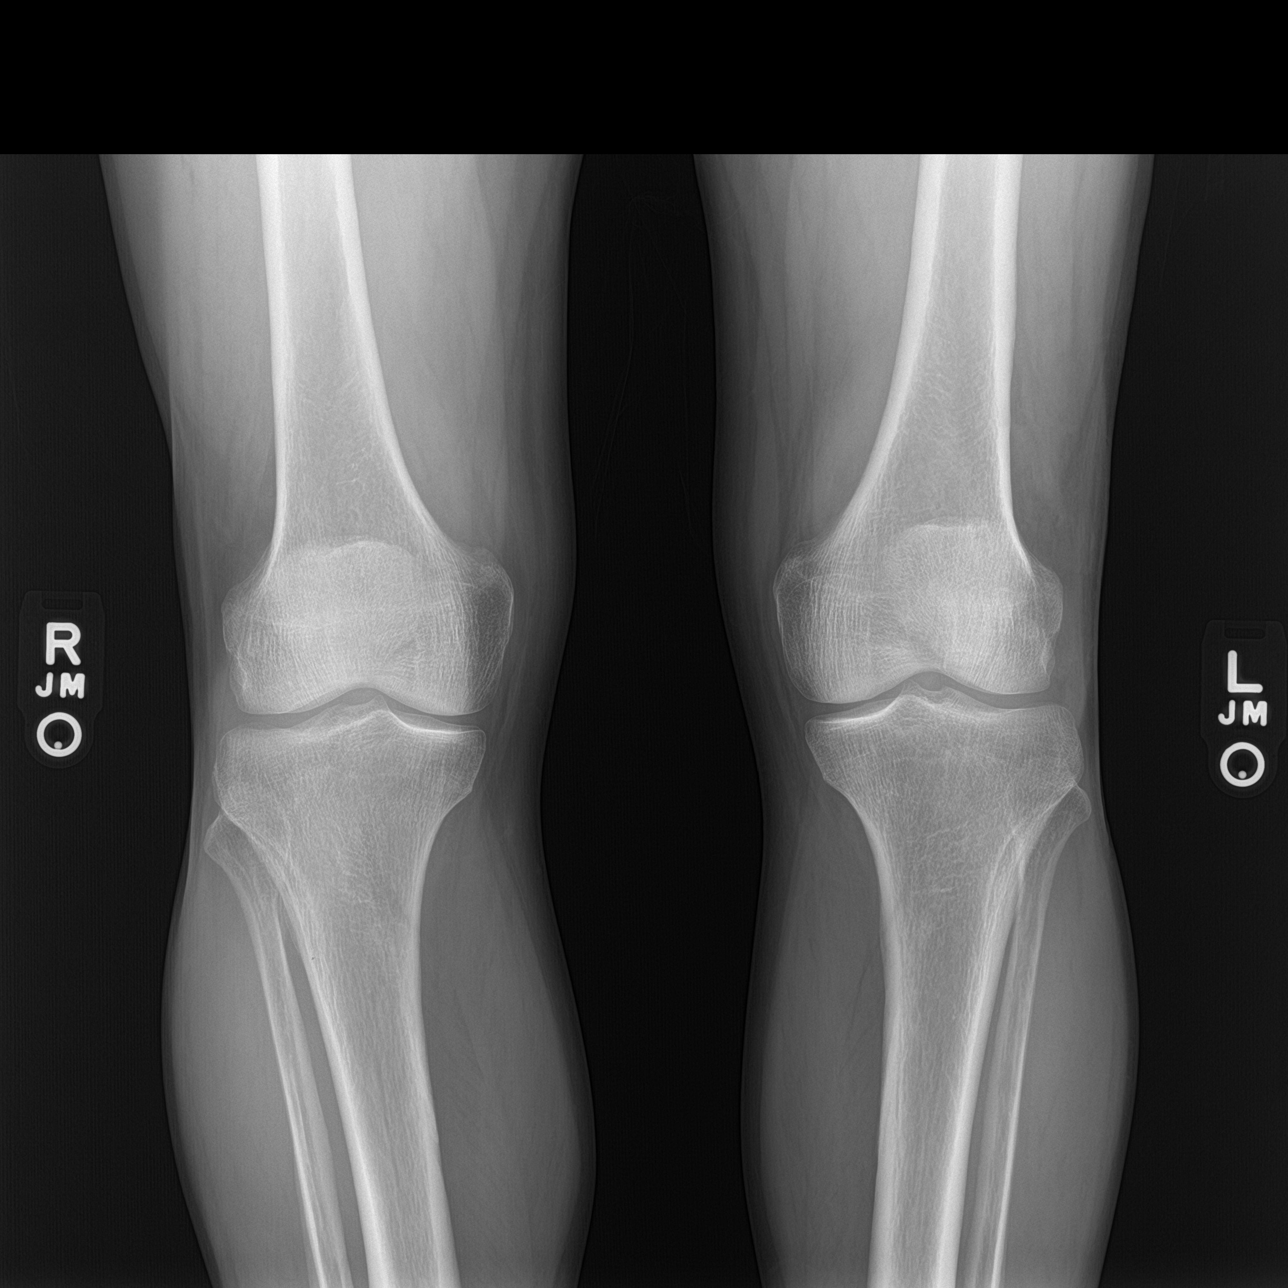

[4 of 4 positions shown; findings below may reference images not displayed]

FINDINGS: Standing frontal and tunnel views obtained. There is slight
narrowing medially, symmetric with the left side. No fracture or
dislocation. No erosive change or intra-articular calcification.
IMPRESSION: No fracture or dislocation.  Slight narrowing medially.

## 2016-01-15 ENCOUNTER — Ambulatory Visit: Payer: Managed Care, Other (non HMO) | Admitting: Sports Medicine

## 2016-01-19 ENCOUNTER — Ambulatory Visit (INDEPENDENT_AMBULATORY_CARE_PROVIDER_SITE_OTHER): Payer: Managed Care, Other (non HMO) | Admitting: Sports Medicine

## 2016-01-19 ENCOUNTER — Encounter: Payer: Self-pay | Admitting: Sports Medicine

## 2016-01-19 VITALS — BP 120/77 | HR 95 | Resp 18 | Wt 166.2 lb

## 2016-01-19 DIAGNOSIS — M778 Other enthesopathies, not elsewhere classified: Secondary | ICD-10-CM

## 2016-01-19 DIAGNOSIS — M779 Enthesopathy, unspecified: Principal | ICD-10-CM

## 2016-01-19 NOTE — Assessment & Plan Note (Signed)
Resolved after percutaneous tenotomy of the distal triceps insertion at the olecranon. Return as needed.

## 2016-01-19 NOTE — Progress Notes (Signed)
  Subjective:    CC: Follow-up  HPI: Right triceps insertion of tendinopathy: Resolved after percutaneous tenotomy of the distal triceps insertion, he does have a few nodules over his elbow that don't bother him.  Past medical history, Surgical history, Family history not pertinant except as noted below, Social history, Allergies, and medications have been entered into the medical record, reviewed, and no changes needed.   Review of Systems: No fevers, chills, night sweats, weight loss, chest pain, or shortness of breath.   Objective:    General: Well Developed, well nourished, and in no acute distress.  Neuro: Alert and oriented x3, extra-ocular muscles intact, sensation grossly intact.  HEENT: Normocephalic, atraumatic, pupils equal round reactive to light, neck supple, no masses, no lymphadenopathy, thyroid nonpalpable.  Skin: Warm and dry, no rashes. Cardiac: Regular rate and rhythm, no murmurs rubs or gallops, no lower extremity edema.  Respiratory: Clear to auscultation bilaterally. Not using accessory muscles, speaking in full sentences. Right Elbow: There are a few subcentimeter small palpable nodules that are well-defined movable, nontender over the olecranon. Range of motion full pronation, supination, flexion, extension. Strength is full to all of the above directions Stable to varus, valgus stress. Negative moving valgus stress test. No discrete areas of tenderness to palpation. Ulnar nerve does not sublux. Negative cubital tunnel Tinel's.  Impression and Recommendations:

## 2016-05-07 ENCOUNTER — Encounter: Payer: Self-pay | Admitting: Sports Medicine

## 2016-05-07 ENCOUNTER — Ambulatory Visit (INDEPENDENT_AMBULATORY_CARE_PROVIDER_SITE_OTHER): Payer: Managed Care, Other (non HMO) | Admitting: Sports Medicine

## 2016-05-07 DIAGNOSIS — M19041 Primary osteoarthritis, right hand: Secondary | ICD-10-CM | POA: Diagnosis not present

## 2016-05-07 NOTE — Progress Notes (Signed)
  Subjective:    CC: Follow-up  HPI: Right hand pain: Has known right hand osteoarthritis, desires injections into the right third and second MCP as well as the thumb basal joint. Pain is moderate, persistent, localized without radiation.  Past medical history, Surgical history, Family history not pertinant except as noted below, Social history, Allergies, and medications have been entered into the medical record, reviewed, and no changes needed.   Review of Systems: No fevers, chills, night sweats, weight loss, chest pain, or shortness of breath.   Objective:    General: Well Developed, well nourished, and in no acute distress.  Neuro: Alert and oriented x3, extra-ocular muscles intact, sensation grossly intact.  HEENT: Normocephalic, atraumatic, pupils equal round reactive to light, neck supple, no masses, no lymphadenopathy, thyroid nonpalpable.  Skin: Warm and dry, no rashes. Cardiac: Regular rate and rhythm, no murmurs rubs or gallops, no lower extremity edema.  Respiratory: Clear to auscultation bilaterally. Not using accessory muscles, speaking in full sentences.  Procedure: Real-time Ultrasound Guided Injection of right second MCP Device: GE Logiq E  Verbal informed consent obtained.  Time-out conducted.  Noted no overlying erythema, induration, or other signs of local infection.  Skin prepped in a sterile fashion.  Local anesthesia: Topical Ethyl chloride.  With sterile technique and under real time ultrasound guidance:  1/2 mL lidocaine, 1/2 mL kenalog 40 injected easily Completed without difficulty  Pain immediately resolved suggesting accurate placement of the medication.  Advised to call if fevers/chills, erythema, induration, drainage, or persistent bleeding.  Images permanently stored and available for review in the ultrasound unit.  Impression: Technically successful ultrasound guided injection.  Procedure: Real-time Ultrasound Guided Injection of right third  MCP Device: GE Logiq E  Verbal informed consent obtained.  Time-out conducted.  Noted no overlying erythema, induration, or other signs of local infection.  Skin prepped in a sterile fashion.  Local anesthesia: Topical Ethyl chloride.  With sterile technique and under real time ultrasound guidance:  1/2 mL lidocaine, 1/2 mL kenalog 40 injected easily Completed without difficulty  Pain immediately resolved suggesting accurate placement of the medication.  Advised to call if fevers/chills, erythema, induration, drainage, or persistent bleeding.  Images permanently stored and available for review in the ultrasound unit.  Impression: Technically successful ultrasound guided injection.  Procedure: Real-time Ultrasound Guided Injection of right trapeziometacarpal joint Device: GE Logiq E  Verbal informed consent obtained.  Time-out conducted.  Noted no overlying erythema, induration, or other signs of local infection.  Skin prepped in a sterile fashion.  Local anesthesia: Topical Ethyl chloride.  With sterile technique and under real time ultrasound guidance:  1/2 mL lidocaine, 1/2 mL kenalog 40 injected easily Completed without difficulty  Pain immediately resolved suggesting accurate placement of the medication.  Advised to call if fevers/chills, erythema, induration, drainage, or persistent bleeding.  Images permanently stored and available for review in the ultrasound unit.  Impression: Technically successful ultrasound guided injection.  Impression and Recommendations:

## 2016-05-07 NOTE — Assessment & Plan Note (Signed)
Right second and third MCP injections as well as right radiocarpal joint injection.

## 2016-08-21 ENCOUNTER — Ambulatory Visit (INDEPENDENT_AMBULATORY_CARE_PROVIDER_SITE_OTHER): Payer: Managed Care, Other (non HMO) | Admitting: Sports Medicine

## 2016-08-21 ENCOUNTER — Encounter: Payer: Self-pay | Admitting: Sports Medicine

## 2016-08-21 DIAGNOSIS — M779 Enthesopathy, unspecified: Secondary | ICD-10-CM

## 2016-08-21 DIAGNOSIS — M6530 Trigger finger, unspecified finger: Secondary | ICD-10-CM

## 2016-08-21 DIAGNOSIS — M19041 Primary osteoarthritis, right hand: Secondary | ICD-10-CM

## 2016-08-21 DIAGNOSIS — M778 Other enthesopathies, not elsewhere classified: Secondary | ICD-10-CM

## 2016-08-21 NOTE — Assessment & Plan Note (Signed)
No injection today, he did well after percutaneous tenotomy back in March. Rehabilitation exercises given, injection if no better in one month.

## 2016-08-21 NOTE — Progress Notes (Signed)
Subjective:    CC: Right hand pain  HPI: This is a pleasant 50 year old male with right hand osteoarthritis, previous injection for several months ago, into the right second and third metacarpal phalangeal joints, having a recurrence of pain and desires repeat injection today.  Trigger point in the: Localized on the right hand, volar, associated with the third flexor tendon sheath. Desires injection.  Right elbow pain: History of insertional triceps tendinopathy, post percutaneous tenotomy with excellent response 7 months ago.  Past medical history:  Negative.  See flowsheet/record as well for more information.  Surgical history: Negative.  See flowsheet/record as well for more information.  Family history: Negative.  See flowsheet/record as well for more information.  Social history: Negative.  See flowsheet/record as well for more information.  Allergies, and medications have been entered into the medical record, reviewed, and no changes needed.   Review of Systems: No fevers, chills, night sweats, weight loss, chest pain, or shortness of breath.   Objective:    General: Well Developed, well nourished, and in no acute distress.  Neuro: Alert and oriented x3, extra-ocular muscles intact, sensation grossly intact.  HEENT: Normocephalic, atraumatic, pupils equal round reactive to light, neck supple, no masses, no lymphadenopathy, thyroid nonpalpable.  Skin: Warm and dry, no rashes. Cardiac: Regular rate and rhythm, no murmurs rubs or gallops, no lower extremity edema.  Respiratory: Clear to auscultation bilaterally. Not using accessory muscles, speaking in full sentences. Right hand: Tender to palpation with swelling at the second and third metacarpal phalangeal joints, there is also a flexor tendon sheath nodule.  Procedure: Real-time Ultrasound Guided Injection of right second MCP Device: GE Logiq E  Verbal informed consent obtained.  Time-out conducted.  Noted no overlying  erythema, induration, or other signs of local infection.  Skin prepped in a sterile fashion.  Local anesthesia: Topical Ethyl chloride.  With sterile technique and under real time ultrasound guidance:  1/2 mL kenalog 40, 1/2 mL lidocaine injected easily Completed without difficulty  Pain immediately resolved suggesting accurate placement of the medication.  Advised to call if fevers/chills, erythema, induration, drainage, or persistent bleeding.  Images permanently stored and available for review in the ultrasound unit.  Impression: Technically successful ultrasound guided injection.  Procedure: Real-time Ultrasound Guided Injection of right third MCP Device: GE Logiq E  Verbal informed consent obtained.  Time-out conducted.  Noted no overlying erythema, induration, or other signs of local infection.  Skin prepped in a sterile fashion.  Local anesthesia: Topical Ethyl chloride.  With sterile technique and under real time ultrasound guidance:  1/2 mL kenalog 40, 1/2 mL lidocaine injected easily Completed without difficulty  Pain immediately resolved suggesting accurate placement of the medication.  Advised to call if fevers/chills, erythema, induration, drainage, or persistent bleeding.  Images permanently stored and available for review in the ultrasound unit.  Impression: Technically successful ultrasound guided injection.  Procedure: Real-time Ultrasound Guided Injection of right third flexor tendon sheath Device: GE Logiq E  Verbal informed consent obtained.  Time-out conducted.  Noted no overlying erythema, induration, or other signs of local infection.  Skin prepped in a sterile fashion.  Local anesthesia: Topical Ethyl chloride.  With sterile technique and under real time ultrasound guidance: 30-gauge needle advanced into the flexor tendon sheath and 1/2 mL kenalog 40, 1/2 mL lidocaine injected easily.  Completed without difficulty  Pain immediately resolved suggesting accurate  placement of the medication.  Advised to call if fevers/chills, erythema, induration, drainage, or persistent bleeding.  Images  permanently stored and available for review in the ultrasound unit.  Impression: Technically successful ultrasound guided injection.  Impression and Recommendations:    Primary osteoarthritis of right hand Right second and third MCP injections. I also placed medication in the right third flexor tendon sheath for a trigger finger.  Triceps tendinitis No injection today, he did well after percutaneous tenotomy back in March. Rehabilitation exercises given, injection if no better in one month.

## 2016-08-21 NOTE — Assessment & Plan Note (Signed)
Right second and third MCP injections. I also placed medication in the right third flexor tendon sheath for a trigger finger.

## 2016-12-13 ENCOUNTER — Encounter: Payer: Self-pay | Admitting: Sports Medicine

## 2016-12-13 ENCOUNTER — Ambulatory Visit (INDEPENDENT_AMBULATORY_CARE_PROVIDER_SITE_OTHER): Payer: Managed Care, Other (non HMO) | Admitting: Sports Medicine

## 2016-12-13 DIAGNOSIS — M19041 Primary osteoarthritis, right hand: Secondary | ICD-10-CM | POA: Diagnosis not present

## 2016-12-13 NOTE — Progress Notes (Signed)
  Subjective:    CC: Right hand pain  HPI: This is a pleasant 51 year old male with known diffuse right hand osteoarthritis, previous injection was 4 months ago to the right third MCP. Having recurrence of pain, moderate, persistent, localized without radiation at the third MCP as well as at the thumb basal joint. Desires interventional treatment today.  Past medical history:  Negative.  See flowsheet/record as well for more information.  Surgical history: Negative.  See flowsheet/record as well for more information.  Family history: Negative.  See flowsheet/record as well for more information.  Social history: Negative.  See flowsheet/record as well for more information.  Allergies, and medications have been entered into the medical record, reviewed, and no changes needed.   Review of Systems: No fevers, chills, night sweats, weight loss, chest pain, or shortness of breath.   Objective:    General: Well Developed, well nourished, and in no acute distress.  Neuro: Alert and oriented x3, extra-ocular muscles intact, sensation grossly intact.  HEENT: Normocephalic, atraumatic, pupils equal round reactive to light, neck supple, no masses, no lymphadenopathy, thyroid nonpalpable.  Skin: Warm and dry, no rashes. Cardiac: Regular rate and rhythm, no murmurs rubs or gallops, no lower extremity edema.  Respiratory: Clear to auscultation bilaterally. Not using accessory muscles, speaking in full sentences.  Procedure: Real-time Ultrasound Guided Injection of right Trapeziometacarpal joint Device: GE Logiq E  Verbal informed consent obtained.  Time-out conducted.  Noted no overlying erythema, induration, or other signs of local infection.  Skin prepped in a sterile fashion.  Local anesthesia: Topical Ethyl chloride.  With sterile technique and under real time ultrasound guidance:  1/2 mL kenalog 40, 1/2 mL lidocaine injected easily Completed without difficulty  Pain immediately resolved  suggesting accurate placement of the medication.  Advised to call if fevers/chills, erythema, induration, drainage, or persistent bleeding.  Images permanently stored and available for review in the ultrasound unit.  Impression: Technically successful ultrasound guided injection.  Procedure: Real-time Ultrasound Guided Injection of right third MCP Device: GE Logiq E  Verbal informed consent obtained.  Time-out conducted.  Noted no overlying erythema, induration, or other signs of local infection.  Skin prepped in a sterile fashion.  Local anesthesia: Topical Ethyl chloride.  With sterile technique and under real time ultrasound guidance:  1/2 mL kenalog 40, 1/2 mL lidocaine injected easily Completed without difficulty  Pain immediately resolved suggesting accurate placement of the medication.  Advised to call if fevers/chills, erythema, induration, drainage, or persistent bleeding.  Images permanently stored and available for review in the ultrasound unit.  Impression: Technically successful ultrasound guided injection.  Impression and Recommendations:    Primary osteoarthritis of right hand Injections at the right third MCP and the right trapeziometacarpal joint. Previous injections provided for months of relief, return as needed.

## 2016-12-13 NOTE — Assessment & Plan Note (Signed)
Injections at the right third MCP and the right trapeziometacarpal joint. Previous injections provided for months of relief, return as needed.

## 2017-04-08 ENCOUNTER — Ambulatory Visit (INDEPENDENT_AMBULATORY_CARE_PROVIDER_SITE_OTHER): Payer: 59 | Admitting: Sports Medicine

## 2017-04-08 DIAGNOSIS — M67442 Ganglion, left hand: Secondary | ICD-10-CM | POA: Diagnosis not present

## 2017-04-08 DIAGNOSIS — M19041 Primary osteoarthritis, right hand: Secondary | ICD-10-CM | POA: Diagnosis not present

## 2017-04-08 NOTE — Progress Notes (Signed)
  Subjective:    CC: Right hand pain  HPI: This is a pleasant 51 year old male, he has known right hand osteoarthritis, previous right third metacarpophalangeal joint injection was about 4 months ago, had a good response but now has recurrence of pain that is not responding to topical cryotherapy or NSAIDs. Pain is moderate, persistent and he desires interventional treatment today.  Past medical history:  Negative.  See flowsheet/record as well for more information.  Surgical history: Negative.  See flowsheet/record as well for more information.  Family history: Negative.  See flowsheet/record as well for more information.  Social history: Negative.  See flowsheet/record as well for more information.  Allergies, and medications have been entered into the medical record, reviewed, and no changes needed.   Review of Systems: No fevers, chills, night sweats, weight loss, chest pain, or shortness of breath.   Objective:    General: Well Developed, well nourished, and in no acute distress.  Neuro: Alert and oriented x3, extra-ocular muscles intact, sensation grossly intact.  HEENT: Normocephalic, atraumatic, pupils equal round reactive to light, neck supple, no masses, no lymphadenopathy, thyroid nonpalpable.  Skin: Warm and dry, no rashes. Cardiac: Regular rate and rhythm, no murmurs rubs or gallops, no lower extremity edema.  Respiratory: Clear to auscultation bilaterally. Not using accessory muscles, speaking in full sentences. Right hand: Visibly swollen third metacarpophalangeal joint. Tender to palpation.  Procedure: Real-time Ultrasound Guided Injection of right third MCP Device: GE Logiq E  Verbal informed consent obtained.  Time-out conducted.  Noted no overlying erythema, induration, or other signs of local infection.  Skin prepped in a sterile fashion.  Local anesthesia: Topical Ethyl chloride.  With sterile technique and under real time ultrasound guidance:  Noted copious  synovitis and joint space loss, 25-gauge needle advanced into the joint and 1/2 mL kenalog 40, 1/2 mL lidocaine injected easily. Completed without difficulty  Pain immediately resolved suggesting accurate placement of the medication.  Advised to call if fevers/chills, erythema, induration, drainage, or persistent bleeding.  Images permanently stored and available for review in the ultrasound unit.  Impression: Technically successful ultrasound guided injection.  Impression and Recommendations:    Primary osteoarthritis of right hand Nearly 4 month response to previous injection, repeat right third metacarpal phalangeal joint injection.  Return as needed.   He also had a ganglion cyst on the left hand, volar DIP, I recommended conservative treatment with icing and NSAIDs before considering aspiration and injection.

## 2017-04-08 NOTE — Assessment & Plan Note (Addendum)
Nearly 4 month response to previous injection, repeat right third metacarpal phalangeal joint injection.  Return as needed.   He also had a ganglion cyst on the left hand, volar DIP, I recommended conservative treatment with icing and NSAIDs before considering aspiration and injection.

## 2017-06-23 ENCOUNTER — Ambulatory Visit (INDEPENDENT_AMBULATORY_CARE_PROVIDER_SITE_OTHER): Payer: 59 | Admitting: Sports Medicine

## 2017-06-23 ENCOUNTER — Encounter: Payer: Self-pay | Admitting: Sports Medicine

## 2017-06-23 DIAGNOSIS — M1990 Unspecified osteoarthritis, unspecified site: Secondary | ICD-10-CM | POA: Diagnosis not present

## 2017-06-23 DIAGNOSIS — M19079 Primary osteoarthritis, unspecified ankle and foot: Secondary | ICD-10-CM | POA: Insufficient documentation

## 2017-06-23 MED ORDER — IBUPROFEN 800 MG PO TABS: 800.0000 mg | ORAL_TABLET | Freq: Three times a day (TID) | ORAL | 2 refills | Status: DC | PRN

## 2017-06-23 NOTE — Assessment & Plan Note (Signed)
This occurred after a night of bowling. Ibuprofen 800, relative rest, return as needed.

## 2017-06-23 NOTE — Progress Notes (Signed)
  Subjective:    CC:  Foot pain  HPI: After a long night of bowling this pleasant 51 year old male woke up with severe pain in his left foot at the second and third MTPs, both dorsal and plantar. Moderate, persistent without radiation, no trauma, no swelling.  Past medical history:  Negative.  See flowsheet/record as well for more information.  Surgical history: Negative.  See flowsheet/record as well for more information.  Family history: Negative.  See flowsheet/record as well for more information.  Social history: Negative.  See flowsheet/record as well for more information.  Allergies, and medications have been entered into the medical record, reviewed, and no changes needed.   Review of Systems: No fevers, chills, night sweats, weight loss, chest pain, or shortness of breath.   Objective:    General: Well Developed, well nourished, and in no acute distress.  Neuro: Alert and oriented x3, extra-ocular muscles intact, sensation grossly intact.  HEENT: Normocephalic, atraumatic, pupils equal round reactive to light, neck supple, no masses, no lymphadenopathy, thyroid nonpalpable.  Skin: Warm and dry, no rashes. Cardiac: Regular rate and rhythm, no murmurs rubs or gallops, no lower extremity edema.  Respiratory: Clear to auscultation bilaterally. Not using accessory muscles, speaking in full sentences. Left Foot: No visible erythema or swelling. Range of motion is full in all directions. Strength is 5/5 in all directions. No hallux valgus. No pes cavus or pes planus. No abnormal callus noted. No pain over the navicular prominence, or base of fifth metatarsal. No tenderness to palpation of the calcaneal insertion of plantar fascia. No pain at the Achilles insertion. No pain over the calcaneal bursa. No pain of the retrocalcaneal bursa. Tenderness to palpation over the dorsum and the plantar aspect of the second and third metatarsophalangeal joints. No hallux rigidus or  limitus. No tenderness palpation over interphalangeal joints. No pain with compression of the metatarsal heads. Neurovascularly intact distally.  Impression and Recommendations:    Left second and third MTP synovitis This occurred after a night of bowling. Ibuprofen 800, relative rest, return as needed.  ___________________________________________ Ihor Austinhomas J. Benjamin Stainhekkekandam, M.D., ABFM., CAQSM. Primary Care and Sports Medicine [place] [facility]  Adjunct Instructor of Family Medicine  [employer]

## 2017-08-15 ENCOUNTER — Ambulatory Visit (INDEPENDENT_AMBULATORY_CARE_PROVIDER_SITE_OTHER): Payer: 59 | Admitting: Sports Medicine

## 2017-08-15 DIAGNOSIS — M19041 Primary osteoarthritis, right hand: Secondary | ICD-10-CM

## 2017-08-15 NOTE — Assessment & Plan Note (Addendum)
746-month response to previous right third MCP injection. Repeat injection today. Return as needed.

## 2017-08-15 NOTE — Progress Notes (Signed)
  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  Past medical history:  Negative.  See flowsheet/record as well for more information.  Surgical history: Negative.  See flowsheet/record as well for more information.  Family history: Negative.  See flowsheet/record as well for more information.  Social history: Negative.  See flowsheet/record as well for more information.  Allergies, and medications have been entered into the medical record, reviewed, and no changes needed.   Review of Systems: No fevers, chills, night sweats, weight loss, chest pain, or shortness of breath.   Objective:    General: Well Developed, well nourished, and in no acute distress.  Neuro: Alert and oriented x3, extra-ocular muscles intact, sensation grossly intact.  HEENT: Normocephalic, atraumatic, pupils equal round reactive to light, neck supple, no masses, no lymphadenopathy, thyroid nonpalpable.  Skin: Warm and dry, no rashes. Cardiac: Regular rate and rhythm, no murmurs rubs or gallops, no lower extremity edema.  Respiratory: Clear to auscultation bilaterally. Not using accessory muscles, speaking in full sentences. Right hand: Tender, swollen at the right third MCP without overlying erythema or induration.  Procedure: Real-time Ultrasound Guided Injection of right third MCP Device: GE Logiq E  Verbal informed consent obtained.  Time-out conducted.  Noted no overlying erythema, induration, or other signs of local infection.  Skin prepped in a sterile fashion.  Local anesthesia: Topical Ethyl chloride.  With sterile technique and under real time ultrasound guidance: 1/2 cc kenalog 40, 1/2 cc lidocaine injected slowly and easily. Completed  without difficulty  Pain immediately resolved suggesting accurate placement of the medication.  Advised to call if fevers/chills, erythema, induration, drainage, or persistent bleeding.  Images permanently stored and available for review in the ultrasound unit.  Impression: Technically successful ultrasound guided injection.  Impression and Recommendations:    Primary osteoarthritis of right hand 415-month response to previous right third MCP injection. Repeat injection today. Return as needed. ___________________________________________ Ihor Austinhomas J. Benjamin Stainhekkekandam, M.D., ABFM., CAQSM. Primary Care and Sports Medicine [place] [facility]  Adjunct Instructor of Family Medicine  [employer]

## 2017-11-19 ENCOUNTER — Ambulatory Visit (INDEPENDENT_AMBULATORY_CARE_PROVIDER_SITE_OTHER): Payer: 59 | Admitting: Sports Medicine

## 2017-11-19 ENCOUNTER — Encounter: Payer: Self-pay | Admitting: Sports Medicine

## 2017-11-19 DIAGNOSIS — M19041 Primary osteoarthritis, right hand: Secondary | ICD-10-CM

## 2017-11-19 NOTE — Assessment & Plan Note (Signed)
Previous injection was approximately 3 months ago, repeat injections, right third metacarpophalangeal joint and right first metacarpophalangeal joint.

## 2017-11-19 NOTE — Progress Notes (Signed)
Subjective:    CC: Right hand pain  HPI: Darren Mcdaniel is a pleasant 52 year old male, he has known hand osteoarthritis, we have done a few injections approximately 5-7 months apart into his right hand, third MCP.  He is now having a recurrence of pain 3 months after his last injection, but also has an increase in pain in his right first MCP as well.  Pain is moderate, persistent, localized without radiation, no trauma.  He does note the pain is worse during bad weather.  I reviewed the past medical history, family history, social history, surgical history, and allergies today and no changes were needed.  Please see the problem list section below in epic for further details.  Past Medical History: No past medical history on file. Past Surgical History: No past surgical history on file. Social History: Social History   Socioeconomic History  . Marital status: Divorced    Spouse name: None  . Number of children: None  . Years of education: None  . Highest education level: None  Social Needs  . Financial resource strain: None  . Food insecurity - worry: None  . Food insecurity - inability: None  . Transportation needs - medical: None  . Transportation needs - non-medical: None  Occupational History  . None  Tobacco Use  . Smoking status: Never Smoker  . Smokeless tobacco: Current User    Types: Chew  Substance and Sexual Activity  . Alcohol use: Yes    Alcohol/week: 0.0 oz  . Drug use: No  . Sexual activity: None  Other Topics Concern  . None  Social History Narrative  . None   Family History: Family History  Problem Relation Age of Onset  . Diabetes Father   . Alcohol abuse Paternal Uncle    Allergies: No Known Allergies Medications: See med rec.  Review of Systems: No fevers, chills, night sweats, weight loss, chest pain, or shortness of breath.   Objective:    General: Well Developed, well nourished, and in no acute distress.  Neuro: Alert and oriented x3,  extra-ocular muscles intact, sensation grossly intact.  HEENT: Normocephalic, atraumatic, pupils equal round reactive to light, neck supple, no masses, no lymphadenopathy, thyroid nonpalpable.  Skin: Warm and dry, no rashes. Cardiac: Regular rate and rhythm, no murmurs rubs or gallops, no lower extremity edema.  Respiratory: Clear to auscultation bilaterally. Not using accessory muscles, speaking in full sentences. Right hand: Visibly swollen, tender third MCP, also with some tenderness at the first MCP, trapeziometacarpal joint is nontender and unremarkable.  Procedure: Real-time Ultrasound Guided Injection of right third MCP Device: GE Logiq E  Verbal informed consent obtained.  Time-out conducted.  Noted no overlying erythema, induration, or other signs of local infection.  Skin prepped in a sterile fashion.  Local anesthesia: Topical Ethyl chloride.  With sterile technique and under real time ultrasound guidance: 1/2 cc Kenalog 40, 1/2 cc lidocaine injected easily Completed without difficulty  Pain immediately resolved suggesting accurate placement of the medication.  Advised to call if fevers/chills, erythema, induration, drainage, or persistent bleeding.  Images permanently stored and available for review in the ultrasound unit.  Impression: Technically successful ultrasound guided injection.  Procedure: Real-time Ultrasound Guided Injection of right first MCP Device: GE Logiq E  Verbal informed consent obtained.  Time-out conducted.  Noted no overlying erythema, induration, or other signs of local infection.  Skin prepped in a sterile fashion.  Local anesthesia: Topical Ethyl chloride.  With sterile technique and under real time ultrasound  guidance: 1/2 cc Kenalog 40, 1/2 cc lidocaine injected easily Completed without difficulty  Pain immediately resolved suggesting accurate placement of the medication.  Advised to call if fevers/chills, erythema, induration, drainage, or  persistent bleeding.  Images permanently stored and available for review in the ultrasound unit.  Impression: Technically successful ultrasound guided injection.  Impression and Recommendations:    Primary osteoarthritis of right hand Previous injection was approximately 3 months ago, repeat injections, right third metacarpophalangeal joint and right first metacarpophalangeal joint.  ___________________________________________ Ihor Austinhomas J. Benjamin Stainhekkekandam, M.D., ABFM., CAQSM. Primary Care and Sports Medicine [place] [facility]  Adjunct Instructor of Family Medicine  [employer]

## 2018-02-16 ENCOUNTER — Ambulatory Visit: Payer: 59 | Admitting: Sports Medicine

## 2018-02-19 ENCOUNTER — Ambulatory Visit (INDEPENDENT_AMBULATORY_CARE_PROVIDER_SITE_OTHER): Payer: 59 | Admitting: Sports Medicine

## 2018-02-19 ENCOUNTER — Encounter: Payer: Self-pay | Admitting: Sports Medicine

## 2018-02-19 ENCOUNTER — Other Ambulatory Visit: Payer: Self-pay

## 2018-02-19 ENCOUNTER — Telehealth: Payer: Self-pay | Admitting: Sports Medicine

## 2018-02-19 VITALS — BP 128/80 | HR 111 | Ht 66.0 in | Wt 171.0 lb

## 2018-02-19 DIAGNOSIS — Z23 Encounter for immunization: Secondary | ICD-10-CM

## 2018-02-19 DIAGNOSIS — M19041 Primary osteoarthritis, right hand: Secondary | ICD-10-CM

## 2018-02-19 DIAGNOSIS — Z0000 Encounter for general adult medical examination without abnormal findings: Secondary | ICD-10-CM

## 2018-02-19 MED ORDER — CELECOXIB 200 MG PO CAPS: ORAL_CAPSULE | ORAL | 2 refills | Status: DC

## 2018-02-19 NOTE — Assessment & Plan Note (Addendum)
Right third MCP injection as above, return as needed. Switching to Celebrex.

## 2018-02-19 NOTE — Assessment & Plan Note (Addendum)
We have not had a chance to do any preventative measures. Adding routine labs including HIV screening, colon cancer screening with Cologuard. Tdap today.

## 2018-02-19 NOTE — Addendum Note (Signed)
Addended by: Donne Anon L on: 02/19/2018 09:50 AM   Modules accepted: Orders

## 2018-02-19 NOTE — Progress Notes (Addendum)
Subjective:    CC: Right hand pain  HPI: Darren Mcdaniel is a pleasant 52 year old male with known right hand osteoarthritis, his last injection was about 3 months ago, having a recurrence of pain, right third metacarpophalangeal joint, desires repeat injection, pain is moderate, persistent, localized without radiation.  I reviewed the past medical history, family history, social history, surgical history, and allergies today and no changes were needed.  Please see the problem list section below in epic for further details.  Past Medical History: No past medical history on file. Past Surgical History: No past surgical history on file. Social History: Social History   Socioeconomic History  . Marital status: Divorced    Spouse name: Not on file  . Number of children: Not on file  . Years of education: Not on file  . Highest education level: Not on file  Occupational History  . Not on file  Social Needs  . Financial resource strain: Not on file  . Food insecurity:    Worry: Not on file    Inability: Not on file  . Transportation needs:    Medical: Not on file    Non-medical: Not on file  Tobacco Use  . Smoking status: Never Smoker  . Smokeless tobacco: Current User    Types: Chew  Substance and Sexual Activity  . Alcohol use: Yes    Alcohol/week: 0.0 oz  . Drug use: No  . Sexual activity: Not on file  Lifestyle  . Physical activity:    Days per week: Not on file    Minutes per session: Not on file  . Stress: Not on file  Relationships  . Social connections:    Talks on phone: Not on file    Gets together: Not on file    Attends religious service: Not on file    Active member of club or organization: Not on file    Attends meetings of clubs or organizations: Not on file    Relationship status: Not on file  Other Topics Concern  . Not on file  Social History Narrative  . Not on file   Family History: Family History  Problem Relation Age of Onset  . Diabetes Father     . Alcohol abuse Paternal Uncle    Allergies: No Known Allergies Medications: See med rec.  Review of Systems: No fevers, chills, night sweats, weight loss, chest pain, or shortness of breath.   Objective:    General: Well Developed, well nourished, and in no acute distress.  Neuro: Alert and oriented x3, extra-ocular muscles intact, sensation grossly intact.  HEENT: Normocephalic, atraumatic, pupils equal round reactive to light, neck supple, no masses, no lymphadenopathy, thyroid nonpalpable.  Skin: Warm and dry, no rashes. Cardiac: Regular rate and rhythm, no murmurs rubs or gallops, no lower extremity edema.  Respiratory: Clear to auscultation bilaterally. Not using accessory muscles, speaking in full sentences.  Procedure: Real-time Ultrasound Guided Injection of right third MCP Device: GE Logiq E  Verbal informed consent obtained.  Time-out conducted.  Noted no overlying erythema, induration, or other signs of local infection.  Skin prepped in a sterile fashion.  Local anesthesia: Topical Ethyl chloride.  With sterile technique and under real time ultrasound guidance: 1/2 cc kenalog 40, 1/2 cc lidocaine injected easily through a 25-gauge needle. Completed without difficulty  Pain immediately resolved suggesting accurate placement of the medication.  Advised to call if fevers/chills, erythema, induration, drainage, or persistent bleeding.  Images permanently stored and available for review in the  ultrasound unit.  Impression: Technically successful ultrasound guided injection.  Impression and Recommendations:    Primary osteoarthritis of right hand Right third MCP injection as above, return as needed. Switching to Celebrex.  Annual physical exam We have not had a chance to do any preventative measures. Adding routine labs including HIV screening, colon cancer screening with Cologuard. Tdap today.  ___________________________________________ Ihor Austin. Benjamin Stain,  M.D., ABFM., CAQSM. Primary Care and Sports Medicine [place] [facility]  Adjunct Instructor of Family Medicine  [employer]

## 2018-02-19 NOTE — Addendum Note (Signed)
Addended by: Monica Becton on: 02/19/2018 08:38 AM   Modules accepted: Orders

## 2018-02-19 NOTE — Telephone Encounter (Signed)
Approvedtoday  CaseId:[id number];Status:Approved;Review Type:Prior Auth;Coverage Start Date:01/20/2018;Coverage End Date:02/19/2019; Pharmacy notified.

## 2018-02-19 NOTE — Addendum Note (Signed)
Addended by: Monica Becton on: 02/19/2018 08:32 AM   Modules accepted: Orders

## 2018-06-12 ENCOUNTER — Ambulatory Visit (INDEPENDENT_AMBULATORY_CARE_PROVIDER_SITE_OTHER): Payer: 59 | Admitting: Sports Medicine

## 2018-06-12 DIAGNOSIS — M19041 Primary osteoarthritis, right hand: Secondary | ICD-10-CM | POA: Diagnosis not present

## 2018-06-12 NOTE — Assessment & Plan Note (Signed)
Right third MCP injection, return to see me as needed.   Samples of Duexis given.

## 2018-06-12 NOTE — Progress Notes (Signed)
Subjective:    CC: Right hand pain  HPI: This is a pleasant 52 year old male, he has known right third MCP osteoarthritis with synovitis.  At the last visit we switched him to Celebrex, he did note a worsening of pain.  Symptoms are moderate, persistent.  Localized without radiation.  He did go back to ibuprofen.  I reviewed the past medical history, family history, social history, surgical history, and allergies today and no changes were needed.  Please see the problem list section below in epic for further details.  Past Medical History: No past medical history on file. Past Surgical History: No past surgical history on file. Social History: Social History   Socioeconomic History  . Marital status: Divorced    Spouse name: Not on file  . Number of children: Not on file  . Years of education: Not on file  . Highest education level: Not on file  Occupational History  . Not on file  Social Needs  . Financial resource strain: Not on file  . Food insecurity:    Worry: Not on file    Inability: Not on file  . Transportation needs:    Medical: Not on file    Non-medical: Not on file  Tobacco Use  . Smoking status: Never Smoker  . Smokeless tobacco: Current User    Types: Chew  Substance and Sexual Activity  . Alcohol use: Yes    Alcohol/week: 0.0 standard drinks  . Drug use: No  . Sexual activity: Not on file  Lifestyle  . Physical activity:    Days per week: Not on file    Minutes per session: Not on file  . Stress: Not on file  Relationships  . Social connections:    Talks on phone: Not on file    Gets together: Not on file    Attends religious service: Not on file    Active member of club or organization: Not on file    Attends meetings of clubs or organizations: Not on file    Relationship status: Not on file  Other Topics Concern  . Not on file  Social History Narrative  . Not on file   Family History: Family History  Problem Relation Age of Onset  .  Diabetes Father   . Alcohol abuse Paternal Uncle    Allergies: No Known Allergies Medications: See med rec.  Review of Systems: No fevers, chills, night sweats, weight loss, chest pain, or shortness of breath.   Objective:    General: Well Developed, well nourished, and in no acute distress.  Neuro: Alert and oriented x3, extra-ocular muscles intact, sensation grossly intact.  HEENT: Normocephalic, atraumatic, pupils equal round reactive to light, neck supple, no masses, no lymphadenopathy, thyroid nonpalpable.  Skin: Warm and dry, no rashes. Cardiac: Regular rate and rhythm, no murmurs rubs or gallops, no lower extremity edema.  Respiratory: Clear to auscultation bilaterally. Not using accessory muscles, speaking in full sentences. Right hand: Swollen right third MCP.  Tender to palpation.  Procedure: Real-time Ultrasound Guided Injection of right third MCP Device: GE Logiq E  Verbal informed consent obtained.  Time-out conducted.  Noted no overlying erythema, induration, or other signs of local infection.  Skin prepped in a sterile fashion.  Local anesthesia: Topical Ethyl chloride.  With sterile technique and under real time ultrasound guidance: Noted copious synovitis, 25-gauge needle advanced into the joint, injected 1/2 cc kenalog 40, 1/2 cc lidocaine. Completed without difficulty  Pain immediately resolved suggesting accurate placement of  the medication.  Advised to call if fevers/chills, erythema, induration, drainage, or persistent bleeding.  Images permanently stored and available for review in the ultrasound unit.  Impression: Technically successful ultrasound guided injection.  Impression and Recommendations:    Primary osteoarthritis of right hand Right third MCP injection, return to see me as needed.   Samples of Duexis given. ___________________________________________ Ihor Austinhomas J. Benjamin Stainhekkekandam, M.D., ABFM., CAQSM. Primary Care and Sports Medicine   [facility]  Adjunct Instructor of Family Medicine  [employer]

## 2018-07-21 ENCOUNTER — Emergency Department (INDEPENDENT_AMBULATORY_CARE_PROVIDER_SITE_OTHER): Payer: 59

## 2018-07-21 ENCOUNTER — Emergency Department (INDEPENDENT_AMBULATORY_CARE_PROVIDER_SITE_OTHER)
Admission: EM | Admit: 2018-07-21 | Discharge: 2018-07-21 | Disposition: A | Discharge status: Home / Self Care | Payer: 59 | Source: Home / Self Care

## 2018-07-21 ENCOUNTER — Other Ambulatory Visit: Payer: Self-pay

## 2018-07-21 DIAGNOSIS — M79642 Pain in left hand: Secondary | ICD-10-CM | POA: Diagnosis not present

## 2018-07-21 DIAGNOSIS — M79645 Pain in left finger(s): Secondary | ICD-10-CM

## 2018-07-21 DIAGNOSIS — M25532 Pain in left wrist: Secondary | ICD-10-CM | POA: Diagnosis not present

## 2018-07-21 DIAGNOSIS — Z9181 History of falling: Secondary | ICD-10-CM

## 2018-07-21 NOTE — Discharge Instructions (Addendum)
Continue to try to rest the hand this week.   Giving you a work excuse through this week.  If it gets dramatically better in the next couple of days call back and we could give you permission to return to work, but I think it is going to take a few more days to resolve.  Continue taking your ibuprofen.  Return to Dr. Karie Schwalbe for further evaluation if you keep having problems.

## 2018-07-21 NOTE — ED Provider Notes (Signed)
[facility]    CSN: [id number] Arrival date & time: 07/21/18  1000     History   Chief Complaint Chief Complaint  Patient presents with  . Hand Pain    HPI Darren Mcdaniel is a 52 y.o. male.  Patient fell Sunday night, falling forward and hitting his hand on the way down on the counter.  He has had previous surgery on his left wrist.  He has pain in his left wrist and in the second and third knuckle of the hand.  Hand is swollen some.  He stayed off of work yesterday, went into work for about 2 hours today and decided to come on in to get it checked. HPI  History reviewed. No pertinent past medical history.  Patient Active Problem List   Diagnosis Date Noted  . Left second and third MTP synovitis 06/23/2017  . Triceps tendinitis 12/12/2015  . Calcific tendinitis of left Quadriceps insertion 03/27/2015  . Primary osteoarthritis of right hand 03/27/2015  . Hyperlipidemia 03/16/2015  . Dupuytren's contracture of right hand 03/10/2015  . 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 Annual physical exam 03/10/2015  . 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 FRACTURE SURGERY     left arm '84       Home Medications    Prior to Admission medications   Not on File    Family History Family History  Problem Relation Age of Onset  . Diabetes Father   . Alcohol abuse Paternal Uncle     Social History Social History   Tobacco Use  . Smoking status: Never Smoker  . Smokeless tobacco: Current User    Types: Chew  Substance Use Topics  . Alcohol use: Yes    Alcohol/week: 0.0 standard drinks  . Drug use: No     Allergies   Patient has no known allergies.   Review of Systems Review of Systems Unremarkable  Physical Exam Triage Vital Signs ED Triage Vitals  Enc Vitals Group     BP 07/21/18 1139 (!) 132/94     Pulse Rate 07/21/18 1139 69     Resp --      Temp 07/21/18 1139 98.6 F (37  C)     Temp Source 07/21/18 1139 Oral     SpO2 07/21/18 1139 98 %     Weight 07/21/18 1141 172 lb (78 kg)     Height 07/21/18 1141 5\' 6"  (1.676 m)     Head Circumference --      Peak Flow --      Pain Score 07/21/18 1141 5     Pain Loc --      Pain Edu? --      Excl. in GC? --    No data found.  Updated Vital Signs BP (!) 132/94 (BP Location: Right Arm)   Pulse 69   Temp 98.6 F (37 C) (Oral)   Ht 5\' 6"  (1.676 m)   Wt 78 kg   SpO2 98%   BMI 27.76 kg/m   Visual Acuity Right Eye Distance:   Left Eye Distance:   Bilateral Distance:    Right Eye Near:   Left Eye Near:    Bilateral Near:     Physical Exam Has a little bit of swelling of left hand.  No lacerations.  He is tender along the knuckles, especially the second and third.  Is able to move his fingers but causes pain.  His neurovascular seems intact.  Wrist is also tender around the wrist line.  No effusion palpable there.  UC Treatments / Results  Labs (all labs ordered are listed, but only abnormal results are displayed) Labs Reviewed - No data to display  EKG None  Radiology Dg Wrist Complete Left  Result Date: 07/21/2018 CLINICAL DATA:  Recent fall with left wrist pain, initial encounter EXAM: LEFT WRIST - COMPLETE 3+ VIEW COMPARISON:  None. 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 A distal radial fixation plate is noted. Tiny fragment is noted adjacent to triquetrum on one of the oblique images but not well visualized on the remainder of the exam. IMPRESSION: Changes consistent with prior radial fracture and fixation. Prior ulnar styloid avulsion with nonunion. Tiny density adjacent to the triquetrum on one of the oblique images. The possibility of tiny avulsion could not be totally excluded. Correlation to point tenderness is recommended. Electronically Signed   By: Alcide Clever M.D.   On: 07/21/2018 12:04   Dg Hand Complete Left  Result Date: 07/21/2018 CLINICAL DATA:   Fall 2 days ago with hand pain, initial encounter EXAM: LEFT HAND - COMPLETE 3+ VIEW COMPARISON:  None. FINDINGS: There is well corticated density adjacent to the ulnar styloid consistent with prior trauma. No acute fracture or dislocation is noted. Mild degenerative changes at the first [facility] joint are seen. No soft tissue abnormality is noted. IMPRESSION: Chronic changes without acute abnormality. Electronically Signed   By: Alcide Clever M.D.   On: 07/21/2018 12:01    Procedures Procedures (including critical care time)  Medications Ordered in UC Medications - No data to display  Initial Impression / Assessment and Plan / UC Course  I have reviewed the triage vital signs and the nursing notes.  Pertinent labs & imaging results that were available during my care of the patient were reviewed by me and considered in my medical decision making (see chart for details).     Sprain and strain of the wrist and fingers of right hand that are already arthritic from previous injury.  This will take time to resolve.  Advised icing and rest.  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 Final Clinical Impressions(s) / UC Diagnoses   Final diagnoses:  Left wrist pain  Finger pain, left     Discharge Instructions     Continue to try to rest the hand this week.   Giving you a work excuse through this week.  If it gets dramatically better in the next couple of days call back and we could give you permission to return to work, but I think it is going to take a few more days to resolve.  Continue taking your ibuprofen.  Return to Dr. Karie Schwalbe for further evaluation if you keep having problems.   ED Prescriptions    None     Controlled Substance Prescriptions [place] Controlled Substance Registry consulted? No   Peyton Najjar, MD 07/21/18 1241

## 2018-07-21 NOTE — ED Triage Notes (Signed)
Pt tripped Sunday night and injured left hand.  Since has had pain and swelling.  Good ROM.

## 2018-09-24 ENCOUNTER — Other Ambulatory Visit: Payer: Self-pay

## 2018-09-24 ENCOUNTER — Emergency Department (INDEPENDENT_AMBULATORY_CARE_PROVIDER_SITE_OTHER)
Admission: EM | Admit: 2018-09-24 | Discharge: 2018-09-24 | Disposition: A | Discharge status: Home / Self Care | Payer: 59 | Source: Home / Self Care | Attending: Family Medicine | Admitting: Family Medicine

## 2018-09-24 ENCOUNTER — Encounter: Payer: Self-pay | Admitting: *Deleted

## 2018-09-24 DIAGNOSIS — J069 Acute upper respiratory infection, unspecified: Secondary | ICD-10-CM

## 2018-09-24 DIAGNOSIS — B9789 Other viral agents as the cause of diseases classified elsewhere: Secondary | ICD-10-CM | POA: Diagnosis not present

## 2018-09-24 HISTORY — DX: Unspecified osteoarthritis, unspecified site: M19.90

## 2018-09-24 MED ORDER — BENZONATATE 200 MG PO CAPS: ORAL_CAPSULE | ORAL | 0 refills | Status: DC

## 2018-09-24 MED ORDER — AZITHROMYCIN 250 MG PO TABS: ORAL_TABLET | ORAL | 0 refills | Status: DC

## 2018-09-24 NOTE — ED Triage Notes (Signed)
Pt c/o nasal congestion, sinus pain, HA and productive cough x 2 days. Denies fever. No OTC meds.

## 2018-09-24 NOTE — ED Provider Notes (Signed)
[name]    CSN: [id number] Arrival date & time: 09/24/18  1315     History   Chief Complaint Chief Complaint  Patient presents with  . Nasal Congestion    HPI Darren Mcdaniel is a 52 y.o. male.   Patient complains of four day history of typical cold-like symptoms developing over several days, including mild sore throat, sinus congestion, headache, fatigue, and myalgias.  A cough developed yesterday.  He denies shortness of breath and pleuritic pain.   The history is provided by the patient.    Past Medical History:  Diagnosis Date  . Arthritis     Patient Active Problem List   Diagnosis Date Noted  . Left second and third MTP synovitis 06/23/2017  . Triceps tendinitis 12/12/2015  . Calcific tendinitis of left Quadriceps insertion 03/27/2015  . Primary osteoarthritis of right hand 03/27/2015  . Hyperlipidemia 03/16/2015  . Dupuytren's contracture of right hand 03/10/2015  . 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 Annual physical exam 03/10/2015  . 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 FRACTURE SURGERY     left arm '84       Home Medications    Prior to Admission medications   Medication Sig Start Date End Date Taking? Authorizing Provider  ibuprofen (ADVIL,MOTRIN) 200 MG tablet Take 200 mg by mouth every 6 (six) hours as needed.   Yes [provider]  azithromycin (ZITHROMAX Z-PAK) 250 MG tablet Take 2 tabs today; then begin one tab once daily for 4 more days. (Rx void after 10/02/18) 09/24/18   Lattie Haw, MD  benzonatate (TESSALON) 200 MG capsule Take one cap by mouth at bedtime as needed for cough.  May repeat in 4 to 6 hours 09/24/18   Lattie Haw, MD    Family History Family History  Problem Relation Age of Onset  . Diabetes Father   . Alcohol abuse Paternal Uncle     Social History Social History   Tobacco Use  . Smoking status: Never Smoker    . Smokeless tobacco: Current User    Types: Chew  Substance Use Topics  . Alcohol use: Yes    Alcohol/week: 0.0 standard drinks  . Drug use: No     Allergies   Patient has no known allergies.   Review of Systems Review of Systems + sore throat + cough No pleuritic pain No wheezing + nasal congestion + post-nasal drainage No sinus pain/pressure No itchy/red eyes No earache No hemoptysis No SOB No fever, + chills No nausea No vomiting No abdominal pain No diarrhea No urinary symptoms No skin rash + fatigue + myalgias + headache Used OTC meds without relief  Physical Exam Triage Vital Signs ED Triage Vitals  Enc Vitals Group     BP 09/24/18 1356 126/80     Pulse Rate 09/24/18 1356 73     Resp 09/24/18 1356 18     Temp 09/24/18 1356 98.4 F (36.9 C)     Temp Source 09/24/18 1356 Oral     SpO2 09/24/18 1356 100 %     Weight 09/24/18 1357 172 lb (78 kg)     Height 09/24/18 1357 5\' 6"  (1.676 m)     Head Circumference --      Peak Flow --      Pain Score 09/24/18 1357 0     Pain Loc --      Pain Edu? --  Excl. in GC? --    No data found.  Updated Vital Signs BP 126/80 (BP Location: Right Arm)   Pulse 73   Temp 98.4 F (36.9 C) (Oral)   Resp 18   Ht 5\' 6"  (1.676 m)   Wt 78 kg   SpO2 100%   BMI 27.76 kg/m   Visual Acuity Right Eye Distance:   Left Eye Distance:   Bilateral Distance:    Right Eye Near:   Left Eye Near:    Bilateral Near:     Physical Exam Nursing notes and Vital Signs reviewed. Appearance:  Patient appears stated age, and in no acute distress Eyes:  Pupils are equal, round, and reactive to light and accomodation.  Extraocular movement is intact.  Conjunctivae are not inflamed  Ears:  Canals normal.  Tympanic membranes normal.  Nose:  Mildly congested turbinates.  No sinus tenderness.   Pharynx:  Normal Neck:  Supple.  Enlarged posterior/lateral nodes are palpated bilaterally, tender to palpation on the left.   Lungs:   Clear to auscultation.  Breath sounds are equal.  Moving air well. Heart:  Regular rate and rhythm without murmurs, rubs, or gallops.  Abdomen:  Nontender without masses or hepatosplenomegaly.  Bowel sounds are present.  No CVA or flank tenderness.  Extremities:  No edema.  Skin:  No rash present.    UC Treatments / Results  Labs (all labs ordered are listed, but only abnormal results are displayed) Labs Reviewed - No data to display  EKG None  Radiology No results found.  Procedures Procedures (including critical care time)  Medications Ordered in UC Medications - No data to display  Initial Impression / Assessment and Plan / UC Course  I have reviewed the triage vital signs and the nursing notes.  Pertinent labs & imaging results that were available during my care of the patient were reviewed by me and considered in my medical decision making (see chart for details).    There is no evidence of bacterial infection today.  Treat symptomatically for now. Prescription written for Benzonatate [facility]) to take at bedtime for night-time cough.  Followup with Family Doctor if not improved in about 10 days.   Final Clinical Impressions(s) / UC Diagnoses   Final diagnoses:  Viral URI with cough     Discharge Instructions     Take plain guaifenesin (1200mg  extended release tabs such as Mucinex) twice daily, with plenty of water, for cough and congestion.  May add Pseudoephedrine (30mg , one or two every 4 to 6 hours) for sinus congestion.  Get adequate rest.   May use Afrin nasal spray (or generic oxymetazoline) each morning for about 5 days and then discontinue.  Also recommend using saline nasal spray several times daily and saline nasal irrigation (AYR is a common brand).  Use Flonase nasal spray each morning after using Afrin nasal spray and saline nasal irrigation. Try warm salt water gargles for sore throat.  Stop all antihistamines for now, and other non-prescription  cough/cold preparations. May take Ibuprofen 200mg , 4 tabs every 8 hours with food for body aches, headache, fever, etc. Begin Azithromycin if not improving about one week or if persistent fever develops  Follow-up with family doctor if not improving about10 days. (Given a prescription to hold, with an expiration date)      ED Prescriptions    Medication Sig Dispense Auth. Provider   benzonatate (TESSALON) 200 MG capsule Take one cap by mouth at bedtime as needed for cough.  May repeat in 4 to 6 hours 15 capsule Lattie HawBeese,  A, MD   azithromycin (ZITHROMAX Z-PAK) 250 MG tablet Take 2 tabs today; then begin one tab once daily for 4 more days. (Rx void after 10/02/18) 6 tablet Lattie HawBeese,  A, MD         Lattie HawBeese,  A, MD 09/24/18 [phone]

## 2018-09-24 NOTE — Discharge Instructions (Addendum)
Take plain guaifenesin (1200mg  extended release tabs such as Mucinex) twice daily, with plenty of water, for cough and congestion.  May add Pseudoephedrine (30mg , one or two every 4 to 6 hours) for sinus congestion.  Get adequate rest.   May use Afrin nasal spray (or generic oxymetazoline) each morning for about 5 days and then discontinue.  Also recommend using saline nasal spray several times daily and saline nasal irrigation (AYR is a common brand).  Use Flonase nasal spray each morning after using Afrin nasal spray and saline nasal irrigation. Try warm salt water gargles for sore throat.  Stop all antihistamines for now, and other non-prescription cough/cold preparations. May take Ibuprofen 200mg , 4 tabs every 8 hours with food for body aches, headache, fever, etc. Begin Azithromycin if not improving about one week or if persistent fever develops  Follow-up with family doctor if not improving about10 days.

## 2018-11-06 ENCOUNTER — Ambulatory Visit: Payer: 59 | Admitting: Physician Assistant

## 2018-11-13 ENCOUNTER — Encounter: Payer: Self-pay | Admitting: Sports Medicine

## 2018-11-13 ENCOUNTER — Ambulatory Visit (INDEPENDENT_AMBULATORY_CARE_PROVIDER_SITE_OTHER): Payer: 59 | Admitting: Sports Medicine

## 2018-11-13 DIAGNOSIS — M19041 Primary osteoarthritis, right hand: Secondary | ICD-10-CM | POA: Diagnosis not present

## 2018-11-13 NOTE — Progress Notes (Signed)
Subjective:    CC: Right hand pain  HPI: This is a pleasant 53 year old male, he has a right hand osteoarthritis.  He has had a few episodes of right third MCP synovitis.  Occasionally these of needed injections, now having recurrence of pain, moderate, worsening, localized at the third MCP without radiation.  Previous injection was back in August 2019.  I reviewed the past medical history, family history, social history, surgical history, and allergies today and no changes were needed.  Please see the problem list section below in epic for further details.  Past Medical History: Past Medical History:  Diagnosis Date  . Arthritis    Past Surgical History: Past Surgical History:  Procedure Laterality Date  . FRACTURE SURGERY     left arm '84   Social History: Social History   Socioeconomic History  . Marital status: Single    Spouse name: Not on file  . Number of children: Not on file  . Years of education: Not on file  . Highest education level: Not on file  Occupational History  . Not on file  Social Needs  . Financial resource strain: Not on file  . Food insecurity:    Worry: Not on file    Inability: Not on file  . Transportation needs:    Medical: Not on file    Non-medical: Not on file  Tobacco Use  . Smoking status: Never Smoker  . Smokeless tobacco: Current User    Types: Chew  Substance and Sexual Activity  . Alcohol use: Yes    Alcohol/week: 0.0 standard drinks  . Drug use: No  . Sexual activity: Not on file  Lifestyle  . Physical activity:    Days per week: Not on file    Minutes per session: Not on file  . Stress: Not on file  Relationships  . Social connections:    Talks on phone: Not on file    Gets together: Not on file    Attends religious service: Not on file    Active member of club or organization: Not on file    Attends meetings of clubs or organizations: Not on file    Relationship status: Not on file  Other Topics Concern  . Not on  file  Social History Narrative  . Not on file   Family History: Family History  Problem Relation Age of Onset  . Diabetes Father   . Alcohol abuse Paternal Uncle    Allergies: No Known Allergies Medications: See med rec.  Review of Systems: No fevers, chills, night sweats, weight loss, chest pain, or shortness of breath.   Objective:    General: Well Developed, well nourished, and in no acute distress.  Neuro: Alert and oriented x3, extra-ocular muscles intact, sensation grossly intact.  HEENT: Normocephalic, atraumatic, pupils equal round reactive to light, neck supple, no masses, no lymphadenopathy, thyroid nonpalpable.  Skin: Warm and dry, no rashes. Cardiac: Regular rate and rhythm, no murmurs rubs or gallops, no lower extremity edema.  Respiratory: Clear to auscultation bilaterally. Not using accessory muscles, speaking in full sentences. Right hand: Swollen erythematous, tender right third MCP.  Palpable synovitis.  Procedure: Real-time Ultrasound Guided Injection of right third MCP Device: GE Logiq E  Verbal informed consent obtained.  Time-out conducted.  Noted no overlying erythema, induration, or other signs of local infection.  Skin prepped in a sterile fashion.  Local anesthesia: Topical Ethyl chloride.  With sterile technique and under real time ultrasound guidance: 1/2 cc Kenalog  40, 1/2 cc lidocaine injected easily. Completed without difficulty  Pain immediately resolved suggesting accurate placement of the medication.  Advised to call if fevers/chills, erythema, induration, drainage, or persistent bleeding.  Images permanently stored and available for review in the ultrasound unit.  Impression: Technically successful ultrasound guided injection.  Impression and Recommendations:    Primary osteoarthritis of right hand Right third MCP injection. Previous injection was at the end of August 2019. ___________________________________________ Darren Mcdaniel.  Benjamin Stain, M.D., ABFM., CAQSM. Primary Care and Sports Medicine [facility]  Adjunct Professor of Family Medicine  [employer]

## 2018-11-13 NOTE — Assessment & Plan Note (Signed)
Right third MCP injection. Previous injection was at the end of August 2019.

## 2019-05-08 IMAGING — DX DG WRIST COMPLETE 3+V*L*
4 series · 4 of 4 positions shown · non-contrast
Comparison: None.

CLINICAL DATA: Recent fall with left wrist pain, initial encounter

EXAM:
LEFT WRIST - COMPLETE 3+ VIEW

[wrist pa]
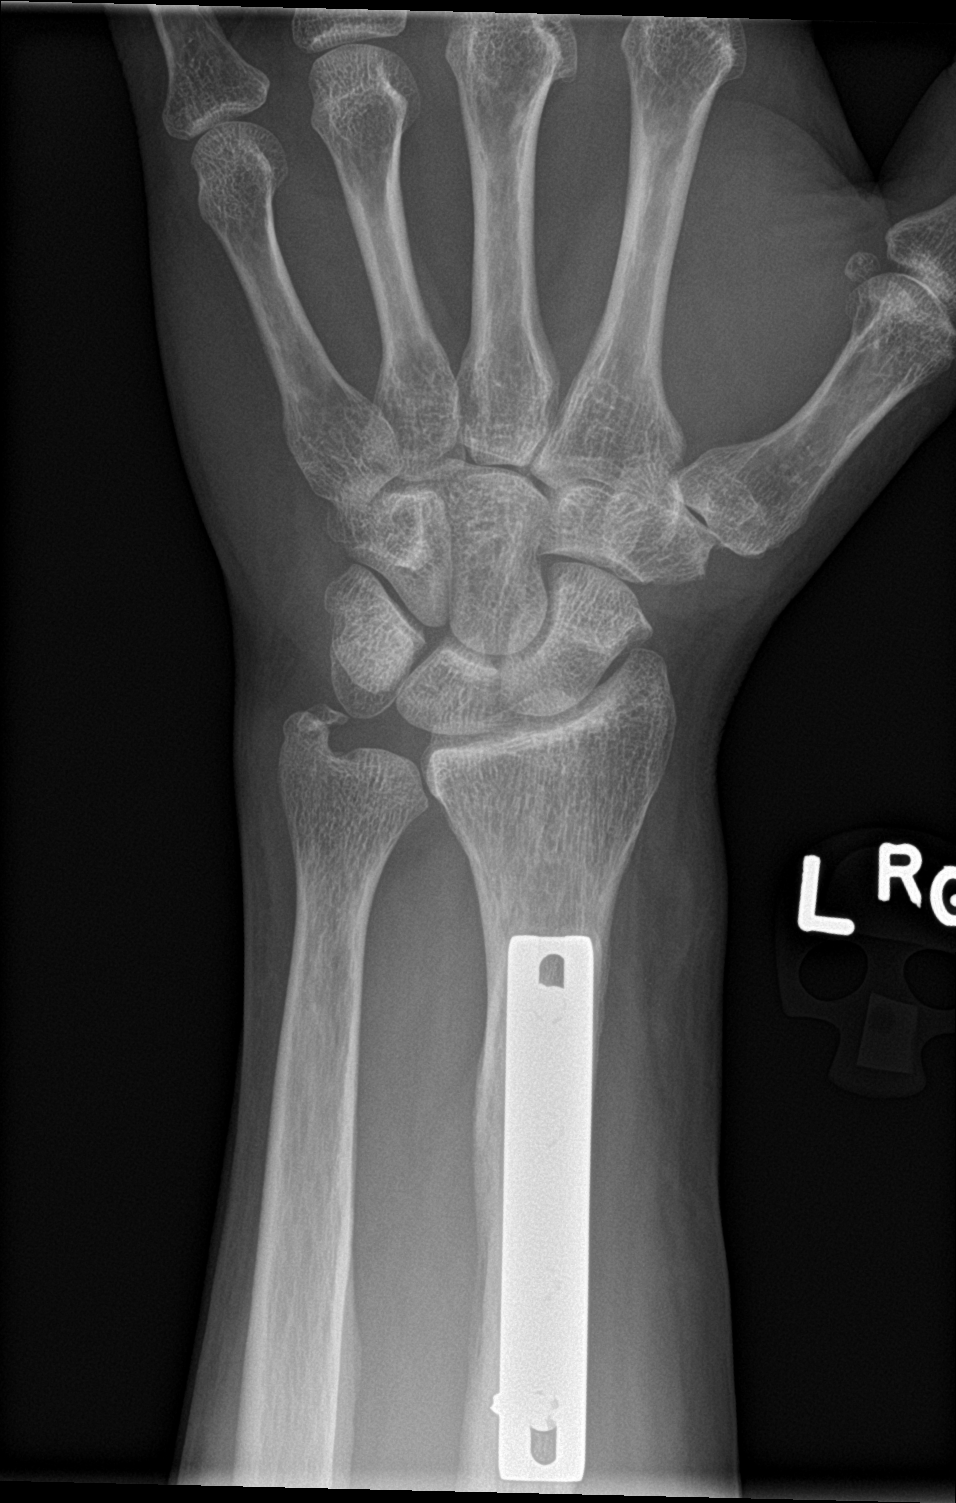

[wrist obl]
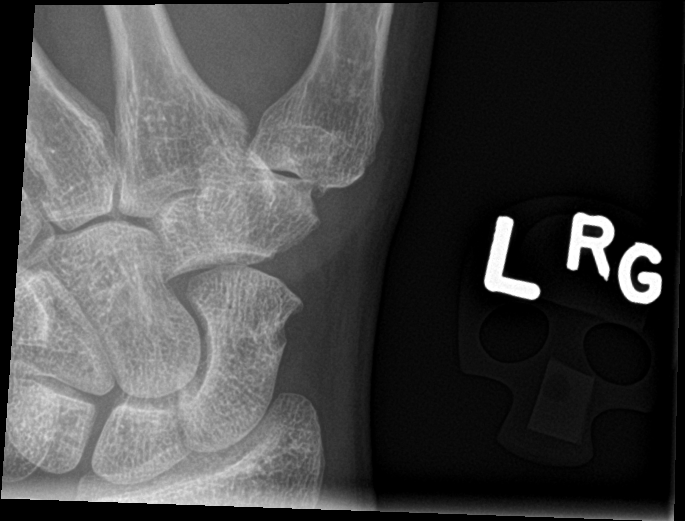

[wrist lat]
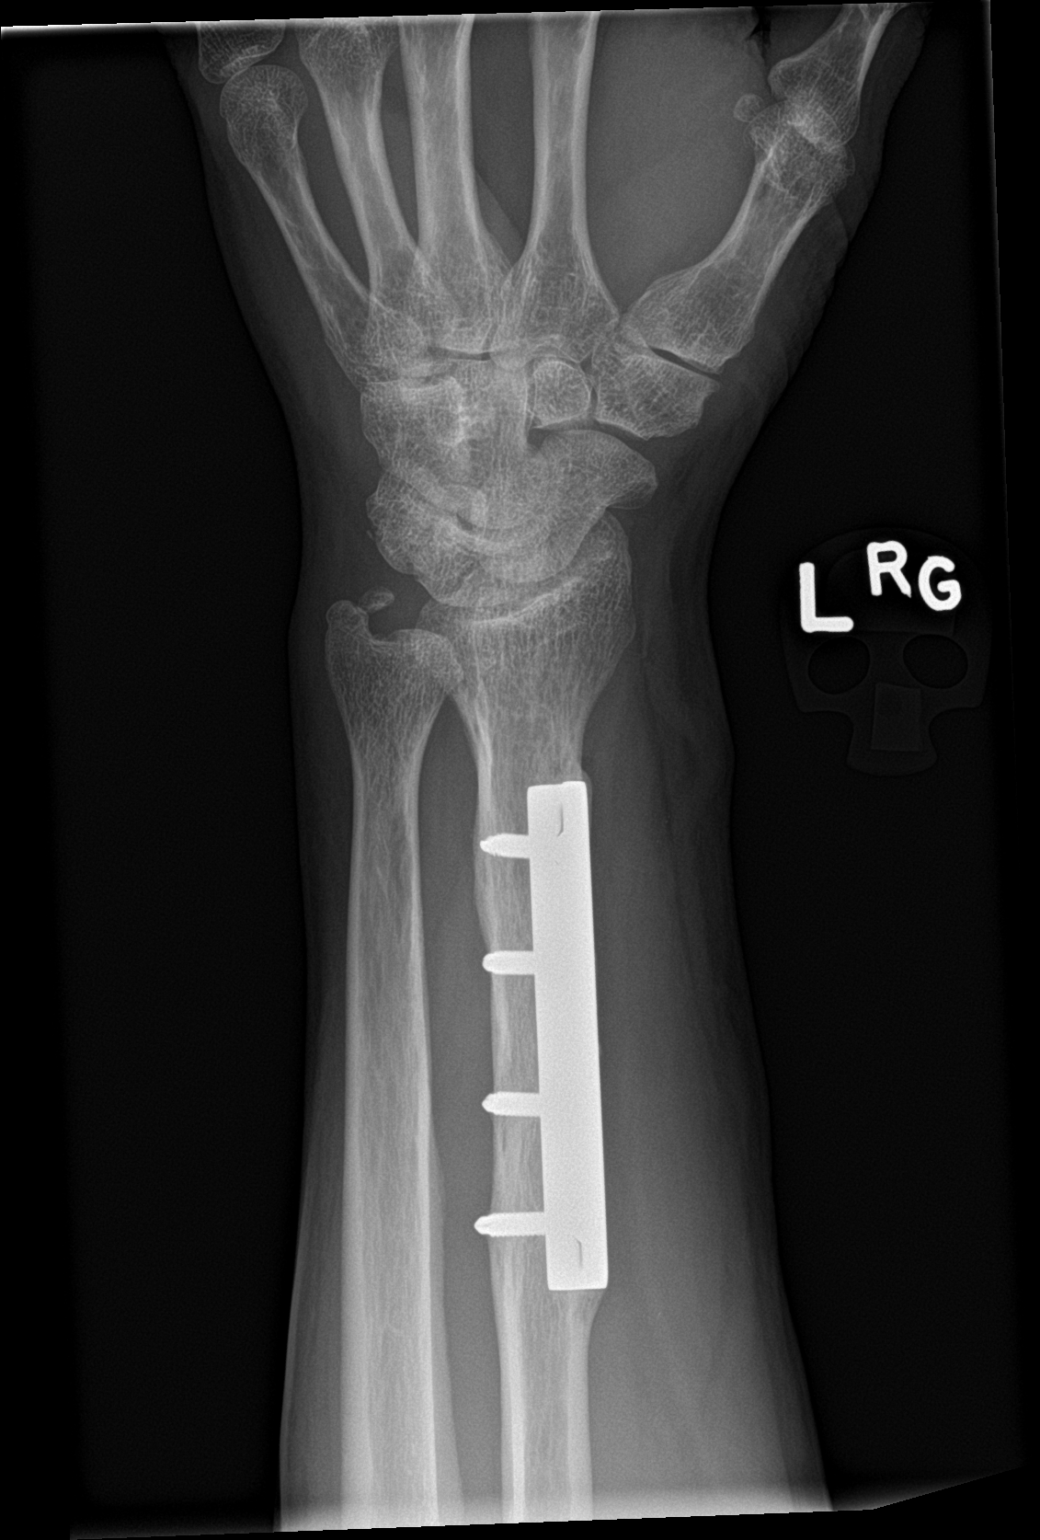

[wrist navicular]
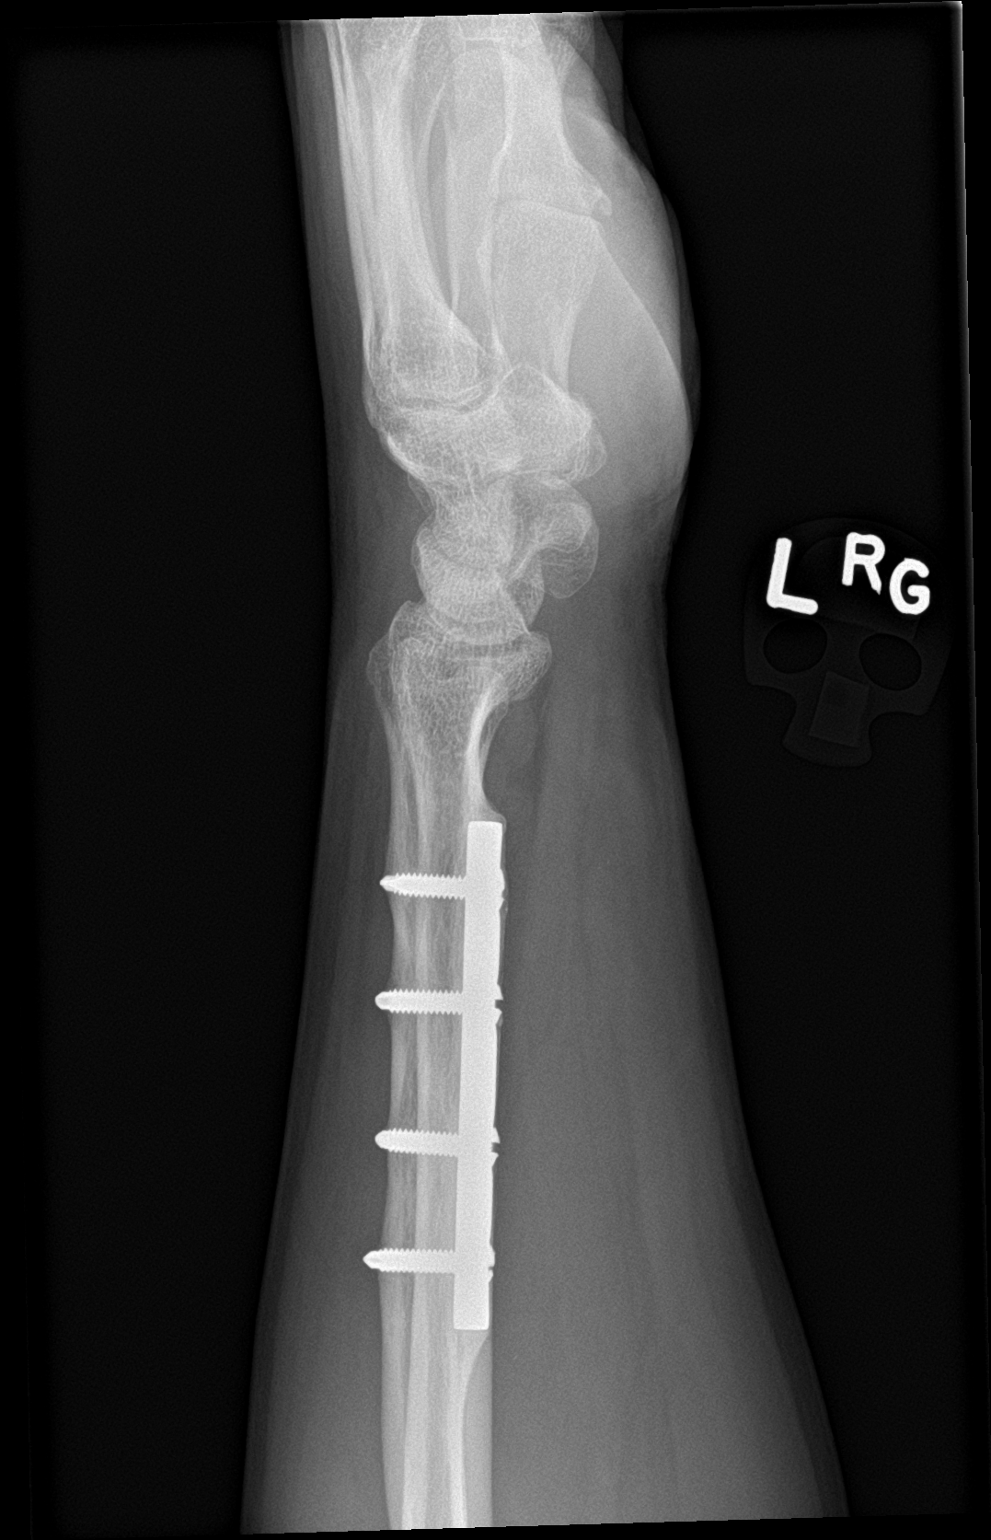

[4 of 4 positions shown; findings below may reference images not displayed]

FINDINGS: Changes of prior ulnar styloid fracture with nonunion are seen. A
distal radial fixation plate is noted. Tiny fragment is noted
adjacent to triquetrum on one of the oblique images but not well
visualized on the remainder of the exam.
IMPRESSION: Changes consistent with prior radial fracture and fixation.

Prior ulnar styloid avulsion with nonunion.

Tiny density adjacent to the triquetrum on one of the oblique
images. The possibility of tiny avulsion could not be totally
excluded. Correlation to point tenderness is recommended.

## 2019-05-27 ENCOUNTER — Ambulatory Visit (INDEPENDENT_AMBULATORY_CARE_PROVIDER_SITE_OTHER): Payer: 59 | Admitting: Sports Medicine

## 2019-05-27 ENCOUNTER — Other Ambulatory Visit: Payer: Self-pay

## 2019-05-27 ENCOUNTER — Encounter: Payer: Self-pay | Admitting: Sports Medicine

## 2019-05-27 DIAGNOSIS — M19041 Primary osteoarthritis, right hand: Secondary | ICD-10-CM | POA: Diagnosis not present

## 2019-05-27 NOTE — Progress Notes (Signed)
Subjective:    CC: Right hand pain  HPI: This is a pleasant 53 year old male, his previous third MCP injection was back in January, now having recurrence pain, moderate, persistent, localized without radiation.  I reviewed the past medical history, family history, social history, surgical history, and allergies today and no changes were needed.  Please see the problem list section below in epic for further details.  Past Medical History: Past Medical History:  Diagnosis Date  . Arthritis    Past Surgical History: Past Surgical History:  Procedure Laterality Date  . FRACTURE SURGERY     left arm '84   Social History: Social History   Socioeconomic History  . Marital status: Single    Spouse name: Not on file  . Number of children: Not on file  . Years of education: Not on file  . Highest education level: Not on file  Occupational History  . Not on file  Social Needs  . Financial resource strain: Not on file  . Food insecurity    Worry: Not on file    Inability: Not on file  . Transportation needs    Medical: Not on file    Non-medical: Not on file  Tobacco Use  . Smoking status: Never Smoker  . Smokeless tobacco: Current User    Types: Chew  Substance and Sexual Activity  . Alcohol use: Yes    Alcohol/week: 0.0 standard drinks  . Drug use: No  . Sexual activity: Not on file  Lifestyle  . Physical activity    Days per week: Not on file    Minutes per session: Not on file  . Stress: Not on file  Relationships  . Social Herbalist on phone: Not on file    Gets together: Not on file    Attends religious service: Not on file    Active member of club or organization: Not on file    Attends meetings of clubs or organizations: Not on file    Relationship status: Not on file  Other Topics Concern  . Not on file  Social History Narrative  . Not on file   Family History: Family History  Problem Relation Age of Onset  . Diabetes Father   . Alcohol  abuse Paternal Uncle    Allergies: No Known Allergies Medications: See med rec.  Review of Systems: No fevers, chills, night sweats, weight loss, chest pain, or shortness of breath.   Objective:    General: Well Developed, well nourished, and in no acute distress.  Neuro: Alert and oriented x3, extra-ocular muscles intact, sensation grossly intact.  HEENT: Normocephalic, atraumatic, pupils equal round reactive to light, neck supple, no masses, no lymphadenopathy, thyroid nonpalpable.  Skin: Warm and dry, no rashes. Cardiac: Regular rate and rhythm, no murmurs rubs or gallops, no lower extremity edema.  Respiratory: Clear to auscultation bilaterally. Not using accessory muscles, speaking in full sentences. Right hand: Swollen third MCP.  Tender to palpation.  Procedure: Real-time Ultrasound Guided injection of the right third MCP Device: GE Logiq E  Verbal informed consent obtained.  Time-out conducted.  Noted no overlying erythema, induration, or other signs of local infection.  Skin prepped in a sterile fashion.  Local anesthesia: Topical Ethyl chloride.  With sterile technique and under real time ultrasound guidance:  1/2 cc Kenalog 40, 1/2 cc lidocaine injected easily Completed without difficulty  Pain immediately resolved suggesting accurate placement of the medication.  Advised to call if fevers/chills, erythema, induration, drainage,  or persistent bleeding.  Images permanently stored and available for review in the ultrasound unit.  Impression: Technically successful ultrasound guided injection.  Impression and Recommendations:    Primary osteoarthritis of right hand Repeat right third metacarpal phalangeal joint injection, previous injection was back in January of this year. Return as needed.   ___________________________________________ Ihor Austinhomas J. Benjamin Stainhekkekandam, M.D., ABFM., CAQSM. Primary Care and Sports Medicine [place]  Adjunct Professor  of Family Medicine  [employer]

## 2019-05-27 NOTE — Assessment & Plan Note (Signed)
Repeat right third metacarpal phalangeal joint injection, previous injection was back in January of this year. Return as needed.

## 2019-11-08 ENCOUNTER — Ambulatory Visit (INDEPENDENT_AMBULATORY_CARE_PROVIDER_SITE_OTHER): Payer: 59 | Admitting: Sports Medicine

## 2019-11-08 DIAGNOSIS — B349 Viral infection, unspecified: Secondary | ICD-10-CM | POA: Insufficient documentation

## 2019-11-08 MED ORDER — DEXAMETHASONE 4 MG PO TABS: 4.0000 mg | ORAL_TABLET | Freq: Three times a day (TID) | ORAL | 0 refills | Status: DC

## 2019-11-08 NOTE — Assessment & Plan Note (Signed)
Deitrich is feeling sick, he has had about 3 days of subjective fevers and chills. Mild to moderate ageusia and anosmia. Moderate cough, fatigue. Achiness, headache. No shortness of breath, chest pain. He will remain quarantined, and I would like him to come back tomorrow for a nurse visit Covid swab. Adding Decadron in the meantime.

## 2019-11-08 NOTE — Progress Notes (Signed)
   Virtual Visit via WebEx/MyChart   I connected with  Darren Mcdaniel  on 11/08/19 via WebEx/MyChart/Doximity Video and verified that I am speaking with the correct person using two identifiers.   I discussed the limitations, risks, security and privacy concerns of performing an evaluation and management service by WebEx/MyChart/Doximity Video, including the higher likelihood of inaccurate diagnosis and treatment, and the availability of in person appointments.  We also discussed the likely need of an additional face to face encounter for complete and high quality delivery of care.  I also discussed with the patient that there may be a patient responsible charge related to this service. The patient expressed understanding and wishes to proceed.  Provider location is either at home or medical facility. Patient location is at their home, different from provider location. People involved in care of the patient during this telehealth encounter were myself, my nurse/medical assistant, and my front office/scheduling team member.  Review of Systems: No fevers, chills, night sweats, weight loss, chest pain, or shortness of breath.   Objective Findings:    General: Speaking full sentences, no audible heavy breathing.  Sounds alert and appropriately interactive.  Appears well.  Face symmetric.  Extraocular movements intact.  Pupils equal and round.  No nasal flaring or accessory muscle use visualized.  Independent interpretation of tests performed by another provider:   None.  Impression and Recommendations:    Viral syndrome Darren Mcdaniel is feeling sick, he has had about 3 days of subjective fevers and chills. Mild to moderate ageusia and anosmia. Moderate cough, fatigue. Achiness, headache. No shortness of breath, chest pain. He will remain quarantined, and I would like him to come back tomorrow for a nurse visit Covid swab. Adding Decadron in the meantime.   I discussed the above assessment and  treatment plan with the patient. The patient was provided an opportunity to ask questions and all were answered. The patient agreed with the plan and demonstrated an understanding of the instructions.   The patient was advised to call back or seek an in-person evaluation if the symptoms worsen or if the condition fails to improve as anticipated.   I provided 30 minutes of face to face and non-face-to-face time during this encounter date, time was needed to gather information, review chart, records, communicate/coordinate with staff remotely, as well as complete documentation.   ___________________________________________ Ihor Austin. Benjamin Stain, M.D., ABFM., CAQSM. Primary Care and Sports Medicine [place] [facility]  Adjunct Instructor of Family Medicine  [employer]

## 2019-11-09 ENCOUNTER — Ambulatory Visit (INDEPENDENT_AMBULATORY_CARE_PROVIDER_SITE_OTHER): Payer: 59 | Admitting: Sports Medicine

## 2019-11-09 DIAGNOSIS — B349 Viral infection, unspecified: Secondary | ICD-10-CM

## 2019-11-09 DIAGNOSIS — Z209 Contact with and (suspected) exposure to unspecified communicable disease: Secondary | ICD-10-CM

## 2019-11-09 NOTE — Progress Notes (Signed)
Patient presents to office via drive-up for COVID swab

## 2019-11-11 LAB — NOVEL CORONAVIRUS, NAA: SARS-CoV-2, NAA: NOT DETECTED

## 2019-12-03 ENCOUNTER — Other Ambulatory Visit: Payer: Self-pay

## 2019-12-03 ENCOUNTER — Ambulatory Visit (INDEPENDENT_AMBULATORY_CARE_PROVIDER_SITE_OTHER): Payer: 59 | Admitting: Sports Medicine

## 2019-12-03 ENCOUNTER — Encounter: Payer: Self-pay | Admitting: Sports Medicine

## 2019-12-03 DIAGNOSIS — M19041 Primary osteoarthritis, right hand: Secondary | ICD-10-CM | POA: Diagnosis not present

## 2019-12-03 NOTE — Assessment & Plan Note (Signed)
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 His last injection was in August 2020, he is now having recurrence of pain, repeat right third metacarpophalangeal joint injection as above, return as needed.

## 2019-12-03 NOTE — Progress Notes (Signed)
    Procedures performed today:    Procedure: Real-time Ultrasound Guided injection of the right third MCP Device: Samsung HS60  Verbal informed consent obtained.  Time-out conducted.  Noted no overlying erythema, induration, or other signs of local infection.  Skin prepped in a sterile fashion.  Local anesthesia: Topical Ethyl chloride.  With sterile technique and under real time ultrasound guidance:  1/2 cc lidocaine, 1/2 cc Kenalog 40 injected easily completed without difficulty  Pain immediately resolved suggesting accurate placement of the medication.  Advised to call if fevers/chills, erythema, induration, drainage, or persistent bleeding.  Images permanently stored and available for review in the ultrasound unit.  Impression: Technically successful ultrasound guided injection.  Independent interpretation of tests performed by another provider:   None.  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 His last injection was in August 2020, he is now having recurrence of pain, repeat right third metacarpophalangeal joint injection as above, return as needed.    ___________________________________________ Ihor Austin. Benjamin Stain, M.D., ABFM., CAQSM. Primary Care and Sports Medicine [place] [facility]  Adjunct Instructor of Family Medicine  [employer]

## 2019-12-13 DIAGNOSIS — U071 COVID-19: Secondary | ICD-10-CM

## 2019-12-13 HISTORY — DX: COVID-19: U07.1

## 2019-12-28 ENCOUNTER — Other Ambulatory Visit: Payer: Self-pay

## 2019-12-28 ENCOUNTER — Ambulatory Visit (INDEPENDENT_AMBULATORY_CARE_PROVIDER_SITE_OTHER): Payer: 59 | Admitting: Sports Medicine

## 2019-12-28 DIAGNOSIS — M25475 Effusion, left foot: Secondary | ICD-10-CM | POA: Diagnosis not present

## 2019-12-28 NOTE — Progress Notes (Signed)
    Procedures performed today:    Procedure: Real-time Ultrasound Guided aspiration/injection of left first MTP. Device: Samsung HS60  Verbal informed consent obtained.  Time-out conducted.  Noted no overlying erythema, induration, or other signs of local infection.  Skin prepped in a sterile fashion.  Local anesthesia: Topical Ethyl chloride.  With sterile technique and under real time ultrasound guidance:  Using a 22-gauge needle advanced into the first MTP, I aspirated the small amount of fluid and then injected 1/2 cc Kenalog 40, 1/2 cc lidocaine. Completed without difficulty  Pain immediately resolved suggesting accurate placement of the medication.  Advised to call if fevers/chills, erythema, induration, drainage, or persistent bleeding.  Images permanently stored and available for review in the ultrasound unit.  Impression: Technically successful ultrasound guided injection.  Independent interpretation of notes and tests performed by another provider:   None.  Impression and Recommendations:    Swelling of first metatarsophalangeal (MTP) joint of left foot Darren Mcdaniel returns, he had some state, today he has increased swelling in his left first MTP, today we performed an arthrocentesis and injection, aspirate will be sent for crystal analysis. Adding x-rays. I like to see him back in 1 month.    ___________________________________________ Darren Mcdaniel. Benjamin Stain, M.D., ABFM., CAQSM. Primary Care and Sports Medicine [place] [facility]  Adjunct Instructor of Family Medicine  [employer]

## 2019-12-28 NOTE — Assessment & Plan Note (Signed)
Darren Mcdaniel returns, he had some state, today he has increased swelling in his left first MTP, today we performed an arthrocentesis and injection, aspirate will be sent for crystal analysis. Adding x-rays. I like to see him back in 1 month.

## 2019-12-29 LAB — CELL COUNT AND DIFF, FLUID, OTHER
Basophils, %: 0 %
Eosinophils, %: 0 %
Lymphocytes, %: 6 %
Mesothelial, %: 0 %
Monocyte/Macrophage %: 10 %
Neutrophils, %: 84 %
Total Nucleated Cell Ct: 3520 cells/uL

## 2019-12-29 LAB — SYNOVIAL FLUID, CRYSTAL

## 2020-01-21 ENCOUNTER — Other Ambulatory Visit: Payer: Self-pay

## 2020-01-21 ENCOUNTER — Encounter: Payer: Self-pay | Admitting: Emergency Medicine

## 2020-01-21 ENCOUNTER — Emergency Department (INDEPENDENT_AMBULATORY_CARE_PROVIDER_SITE_OTHER)
Admission: EM | Admit: 2020-01-21 | Discharge: 2020-01-21 | Disposition: A | Discharge status: Home / Self Care | Payer: 59 | Source: Home / Self Care | Attending: Family Medicine | Admitting: Family Medicine

## 2020-01-21 DIAGNOSIS — Z20822 Contact with and (suspected) exposure to covid-19: Secondary | ICD-10-CM

## 2020-01-21 DIAGNOSIS — J069 Acute upper respiratory infection, unspecified: Secondary | ICD-10-CM

## 2020-01-21 DIAGNOSIS — R509 Fever, unspecified: Secondary | ICD-10-CM | POA: Diagnosis not present

## 2020-01-21 NOTE — ED Provider Notes (Signed)
Darren Mcdaniel [facility]    CSN: [id number] Arrival date & time: 01/21/20  1120      History   Chief Complaint Chief Complaint  Patient presents with  . Fever    chills - no thermometer at home  . Headache  . Generalized Body Aches    HPI Darren Mcdaniel is a 54 y.o. male.   Two days ago after work, patient suddenly felt fatigued, with onset of malaise and fever/chills.  Yesterday he felt worse with onset of sweats, myalgias, and headache (he states that he never has a headache).  Today he developed a mild dry cough, and has lost his sense of taste/smell.  He denies pleuritic pain, shortness of breath, or tightness in his chest.  He also denies sore throat and nasal congestion.  The history is provided by the patient.    Past Medical History:  Diagnosis Date  . Arthritis     Patient Active Problem List   Diagnosis Date Noted  . Swelling of first metatarsophalangeal (MTP) joint of left foot 12/28/2019  . Viral syndrome 11/08/2019  . Left second and third MTP synovitis 06/23/2017  . Triceps tendinitis 12/12/2015  . Calcific tendinitis of left Quadriceps insertion 03/27/2015  . Primary osteoarthritis of right hand 03/27/2015  . Hyperlipidemia 03/16/2015  . Dupuytren's contracture of right hand 03/10/2015  . 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 Annual physical exam 03/10/2015  . 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 FRACTURE SURGERY     left arm '84       Home Medications    Prior to Admission medications   Medication Sig Start Date End Date Taking? Authorizing Provider  ibuprofen (ADVIL,MOTRIN) 200 MG tablet Take 200 mg by mouth every 6 (six) hours as needed.   Yes [provider]    Family History Family History  Problem Relation Age of Onset  . Diabetes Father   . Alcohol abuse Paternal Uncle   . Healthy Mother     Social History Social History   Tobacco Use  . Smoking  status: Never Smoker  . Smokeless tobacco: Current User    Types: Chew  Substance Use Topics  . Alcohol use: Yes    Alcohol/week: 0.0 standard drinks  . Drug use: No     Allergies   Patient has no known allergies.   Review of Systems Review of Systems No sore throat + cough No pleuritic pain No wheezing No nasal congestion No post-nasal drainage No sinus pain/pressure No itchy/red eyes No earache No hemoptysis No SOB + fever/chills No nausea No vomiting No abdominal pain No diarrhea No urinary symptoms No skin rash + fatigue + myalgias + headache   Physical Exam Triage Vital Signs ED Triage Vitals  Enc Vitals Group     BP 01/21/20 1143 120/82     Pulse Rate 01/21/20 1143 85     Resp 01/21/20 1143 17     Temp 01/21/20 1143 99.1 F (37.3 C)     Temp Source 01/21/20 1143 Oral     SpO2 01/21/20 1143 95 %     Weight 01/21/20 1149 167 lb (75.8 kg)     Height 01/21/20 1149 5\' 6"  (1.676 m)     Head Circumference --      Peak Flow --      Pain Score 01/21/20 1148 3     Pain Loc --      Pain Edu? --  Excl. in GC? --    No data found.  Updated Vital Signs BP 120/82 (BP Location: Left Arm)   Pulse 85   Temp 99.1 F (37.3 C) (Oral)   Resp 17   Ht 5\' 6"  (1.676 m)   Wt 75.8 kg   SpO2 95%   BMI 26.95 kg/m   Visual Acuity Right Eye Distance:   Left Eye Distance:   Bilateral Distance:    Right Eye Near:   Left Eye Near:    Bilateral Near:     Physical Exam Nursing notes and Vital Signs reviewed. Appearance:  Patient appears stated age, and in no acute distress Eyes:  Pupils are equal, round, and reactive to light and accomodation.  Extraocular movement is intact.  Conjunctivae are not inflamed  Ears:  Canals normal.  Tympanic membranes normal.  Nose:  Normal turbinates.  No sinus tenderness.  Neck:  Supple.  No adenopathy.   Lungs:  Clear to auscultation.  Breath sounds are equal.  Moving air well. Heart:  Regular rate and rhythm without  murmurs, rubs, or gallops.  Abdomen:  Nontender without masses or hepatosplenomegaly.  Bowel sounds are present.  No CVA or flank tenderness.  Extremities:  No edema.  Skin:  No rash present.   UC Treatments / Results  Labs (all labs ordered are listed, but only abnormal results are displayed) Labs Reviewed  NOVEL CORONAVIRUS, NAA    EKG   Radiology No results found.  Procedures Procedures (including critical care time)  Medications Ordered in UC Medications - No data to display  Initial Impression / Assessment and Plan / UC Course  I have reviewed the triage vital signs and the nursing notes.  Pertinent labs & imaging results that were available during my care of the patient were reviewed by me and considered in my medical decision making (see chart for details).    Benign exam.  Suspect COVID19. COVID19 send out.  There is no evidence of bacterial infection today.  Treat symptomatically for now    Final Clinical Impressions(s) / UC Diagnoses   Final diagnoses:  Fever with exposure to COVID-19 virus  Viral URI with cough     Discharge Instructions     Take plain guaifenesin (1200mg  extended release tabs such as Mucinex) twice daily, with plenty of water, for cough and congestion.   May take Delsym Cough Suppressant at bedtime for nighttime cough.  Stop all antihistamines for now, and other non-prescription cough/cold preparations. May take Ibuprofen 200mg , 4 tabs every 8 hours with food for body aches , fever, headache, etc.   Isolate yourself until COVID-19 test result is available.   If your COVID19 test is positive, then you are infected with the novel coronavirus and could give the virus to others.  Please continue isolation at home for at least 10 days since the start of your symptoms.  Once you complete your 10 day quarantine, you may return to normal activities as long as you've not had a fever for over 24 hours (without taking fever reducing medicine) and  your symptoms are improving. Please continue good preventive care measures, including:  frequent hand-washing, avoid touching your face, cover coughs/sneezes, stay out of crowds and keep a 6 foot distance from others.  Go to the nearest hospital emergency room if fever/cough/breathlessness are severe or illness seems like a threat to life.    ED Prescriptions    None        , MD 01/21/20 1236

## 2020-01-21 NOTE — Discharge Instructions (Addendum)
Take plain guaifenesin (1200mg  extended release tabs such as Mucinex) twice daily, with plenty of water, for cough and congestion.   May take Delsym Cough Suppressant at bedtime for nighttime cough.  Stop all antihistamines for now, and other non-prescription cough/cold preparations. May take Ibuprofen 200mg , 4 tabs every 8 hours with food for body aches , fever, headache, etc.   Isolate yourself until COVID-19 test result is available.   If your COVID19 test is positive, then you are infected with the novel coronavirus and could give the virus to others.  Please continue isolation at home for at least 10 days since the start of your symptoms.  Once you complete your 10 day quarantine, you may return to normal activities as long as you've not had a fever for over 24 hours (without taking fever reducing medicine) and your symptoms are improving. Please continue good preventive care measures, including:  frequent hand-washing, avoid touching your face, cover coughs/sneezes, stay out of crowds and keep a 6 foot distance from others.  Go to the nearest hospital emergency room if fever/cough/breathlessness are severe or illness seems like a threat to life.

## 2020-01-21 NOTE — ED Triage Notes (Signed)
Fever/chills body aches at home - no thermometer per pt No known exposure to covid - requests test General malaise& HA since Wed night

## 2020-01-23 ENCOUNTER — Telehealth: Payer: Self-pay

## 2020-01-23 LAB — SARS-COV-2, NAA 2 DAY TAT

## 2020-01-23 LAB — NOVEL CORONAVIRUS, NAA: SARS-CoV-2, NAA: DETECTED — AB

## 2020-01-23 NOTE — Telephone Encounter (Signed)
Pt called for covid results. Informed neg covid test is positive. Reiterated need to isolate for 10 days since start of sxs before returning to normal activities. Call if any questions. Head to ER if develop SOB. Pt acknowledged.

## 2020-02-23 ENCOUNTER — Other Ambulatory Visit: Payer: Self-pay

## 2020-02-23 ENCOUNTER — Ambulatory Visit (INDEPENDENT_AMBULATORY_CARE_PROVIDER_SITE_OTHER): Payer: 59 | Admitting: Sports Medicine

## 2020-02-23 ENCOUNTER — Telehealth: Payer: Self-pay | Admitting: Sports Medicine

## 2020-02-23 ENCOUNTER — Ambulatory Visit (INDEPENDENT_AMBULATORY_CARE_PROVIDER_SITE_OTHER): Payer: 59

## 2020-02-23 DIAGNOSIS — M25475 Effusion, left foot: Secondary | ICD-10-CM

## 2020-02-23 NOTE — Telephone Encounter (Signed)
Pt needed a note for the rest of the week (return to work on 5/17). He ment to ask when he was in the appt but forgot

## 2020-02-23 NOTE — Telephone Encounter (Signed)
No problem, writing note, he can download it from [employer]

## 2020-02-23 NOTE — Progress Notes (Signed)
    Procedures performed today:    Procedure: Real-time Ultrasound Guided injection of the left first MTP Device: Samsung HS60  Verbal informed consent obtained.  Time-out conducted.  Noted no overlying erythema, induration, or other signs of local infection.  Skin prepped in a sterile fashion.  Local anesthesia: Topical Ethyl chloride.  With sterile technique and under real time ultrasound guidance:  Attempted aspiration, unable to obtain any fluid, then injected 1/2 cc Kenalog 40, 1/2 cc lidocaine.   Completed without difficulty  Pain immediately resolved suggesting accurate placement of the medication.  Advised to call if fevers/chills, erythema, induration, drainage, or persistent bleeding.  Images permanently stored and available for review in the ultrasound unit.  Impression: Technically successful ultrasound guided injection.  Independent interpretation of notes and tests performed by another provider:   None.  Brief History, Exam, Impression, and Recommendations:    Swelling of first metatarsophalangeal (MTP) joint of left foot Recurrence of first MTP swelling. Back in March we did an aspiration and injection, crystal analysis was negative, he did well for several months and is now having a worsening of symptoms, this closely resembles podagra. I was unable to get any fluid out of the joint, on ultrasound there was copious synovitis rather than an effusion. We are going to proceed with x-ray and MRI to evaluate for PVNS. Return to see me to go over MRI results, in 1 month I would like him to return and get his serum uric acid levels checked as well.    ___________________________________________ Ihor Austin. Benjamin Stain, M.D., ABFM., CAQSM. Primary Care and Sports Medicine [place] [facility]  Adjunct Instructor of Family Medicine  [employer]

## 2020-02-23 NOTE — Assessment & Plan Note (Signed)
Recurrence of first MTP swelling. Back in March we did an aspiration and injection, crystal analysis was negative, he did well for several months and is now having a worsening of symptoms, this closely resembles podagra. I was unable to get any fluid out of the joint, on ultrasound there was copious synovitis rather than an effusion. We are going to proceed with x-ray and MRI to evaluate for PVNS. Return to see me to go over MRI results, in 1 month I would like him to return and get his serum uric acid levels checked as well.

## 2020-02-25 ENCOUNTER — Other Ambulatory Visit: Payer: Self-pay | Admitting: Sports Medicine

## 2020-02-25 DIAGNOSIS — Z1389 Encounter for screening for other disorder: Secondary | ICD-10-CM

## 2020-02-27 ENCOUNTER — Ambulatory Visit (INDEPENDENT_AMBULATORY_CARE_PROVIDER_SITE_OTHER): Payer: 59

## 2020-02-27 ENCOUNTER — Other Ambulatory Visit: Payer: Self-pay

## 2020-02-27 DIAGNOSIS — M25475 Effusion, left foot: Secondary | ICD-10-CM | POA: Diagnosis not present

## 2020-02-27 DIAGNOSIS — Z1389 Encounter for screening for other disorder: Secondary | ICD-10-CM

## 2020-02-28 ENCOUNTER — Encounter: Payer: Self-pay | Admitting: Sports Medicine

## 2020-03-08 ENCOUNTER — Ambulatory Visit (INDEPENDENT_AMBULATORY_CARE_PROVIDER_SITE_OTHER): Payer: 59 | Admitting: Sports Medicine

## 2020-03-08 ENCOUNTER — Encounter: Payer: Self-pay | Admitting: Sports Medicine

## 2020-03-08 DIAGNOSIS — M25475 Effusion, left foot: Secondary | ICD-10-CM | POA: Diagnosis not present

## 2020-03-08 MED ORDER — TRAMADOL HCL 50 MG PO TABS: 50.0000 mg | ORAL_TABLET | Freq: Three times a day (TID) | ORAL | 0 refills | Status: DC | PRN

## 2020-03-08 NOTE — Assessment & Plan Note (Signed)
This is a pleasant 54 year old male, we have done a couple of aspirations/injections for his first MTP, none of which showed any crystals, obtained x-rays, MRI that showed osteoarthritis, at this point he continues to have discomfort. I am going to refer him to foot and ankle surgery, add some tramadol in the meantime.  I do suspect he will need a rigid Morton's plate versus simply proceeding straight to an arthrodesis.

## 2020-03-08 NOTE — Progress Notes (Signed)
    Procedures performed today:    None.  Independent interpretation of notes and tests performed by another provider:   None.  Brief History, Exam, Impression, and Recommendations:    Swelling of first metatarsophalangeal (MTP) joint of left foot This is a pleasant 54 year old male, we have done a couple of aspirations/injections for his first MTP, none of which showed any crystals, obtained x-rays, MRI that showed osteoarthritis, at this point he continues to have discomfort. I am going to refer him to foot and ankle surgery, add some tramadol in the meantime.  I do suspect he will need a rigid Morton's plate versus simply proceeding straight to an arthrodesis.    ___________________________________________ Ihor Austin. Benjamin Stain, M.D., ABFM., CAQSM. Primary Care and Sports Medicine [place] [facility]  Adjunct Instructor of Family Medicine  [employer]

## 2020-04-07 ENCOUNTER — Other Ambulatory Visit: Payer: Self-pay

## 2020-04-07 ENCOUNTER — Encounter: Payer: Self-pay | Admitting: Podiatry

## 2020-04-07 ENCOUNTER — Ambulatory Visit (INDEPENDENT_AMBULATORY_CARE_PROVIDER_SITE_OTHER): Payer: 59

## 2020-04-07 ENCOUNTER — Ambulatory Visit (INDEPENDENT_AMBULATORY_CARE_PROVIDER_SITE_OTHER): Payer: 59 | Admitting: Podiatry

## 2020-04-07 DIAGNOSIS — M205X9 Other deformities of toe(s) (acquired), unspecified foot: Secondary | ICD-10-CM

## 2020-04-07 DIAGNOSIS — M7751 Other enthesopathy of right foot: Secondary | ICD-10-CM

## 2020-04-07 DIAGNOSIS — M7752 Other enthesopathy of left foot: Secondary | ICD-10-CM

## 2020-04-07 DIAGNOSIS — M79672 Pain in left foot: Secondary | ICD-10-CM | POA: Diagnosis not present

## 2020-04-07 DIAGNOSIS — M779 Enthesopathy, unspecified: Secondary | ICD-10-CM

## 2020-04-07 DIAGNOSIS — M79675 Pain in left toe(s): Secondary | ICD-10-CM

## 2020-04-07 MED ORDER — MELOXICAM 15 MG PO TABS: 15.0000 mg | ORAL_TABLET | Freq: Every day | ORAL | 0 refills | Status: DC

## 2020-04-10 NOTE — Progress Notes (Signed)
Subjective:   Patient ID: Darren Mcdaniel, male   DOB: 54 y.o.   MRN: [id number]   HPI 54 year old male presents the office today for concerns of pain to the left foot on the big toe joint area which is been intermittent for last 3 months.  States he has had injections previously which does help with the pain does come back if current.  He denies any recent injury.  He has no history of gout.  They have tried to aspirate but nothing came out apparently.  Describes a throbbing sensation.  Gets worse after he his feet a lot.   Review of Systems  All other systems reviewed and are negative.  Past Medical History:  Diagnosis Date  . Arthritis     Past Surgical History:  Procedure Laterality Date  . FRACTURE SURGERY     left arm '84     Current Outpatient Medications:  .  ibuprofen (ADVIL,MOTRIN) 200 MG tablet, Take 200 mg by mouth every 6 (six) hours as needed., Disp: , Rfl:  .  meloxicam (MOBIC) 15 MG tablet, Take 1 tablet (15 mg total) by mouth daily., Disp: 30 tablet, Rfl: 0 .  traMADol (ULTRAM) 50 MG tablet, Take 1-2 tablets (50-100 mg total) by mouth every 8 (eight) hours as needed for moderate pain. Maximum 6 tabs per day., Disp: 21 tablet, Rfl: 0  No Known Allergies       Objective:  Physical Exam  General: AAO x3, NAD  Dermatological: Skin is warm, dry and supple bilateral. Nails x 10 are well manicured; remaining integument appears unremarkable at this time. There are no open sores, no preulcerative lesions, no rash or signs of infection present.  Vascular: Dorsalis Pedis artery and Posterior Tibial artery pedal pulses are 2/4 bilateral with immedate capillary fill time. Pedal hair growth present. No varicosities and no lower extremity edema present bilateral. There is no pain with calf compression, swelling, warmth, erythema.   Neruologic: Grossly intact via light touch bilateral.  Musculoskeletal: At this time there is no significant tenderness palpation of the first MPJ  there is no edema, erythema.  There is slight restriction of range of motion.  Mild bony deformity is also present.  Subjectively gets discomfort in the ball of his foot as well submetatarsal.  No tenderness identified today.  Muscular strength 5/5 in all groups tested bilateral.  Gait: Unassisted, Nonantalgic.       Assessment:   54 year old male mild bunion deformity, first MPJ arthritis     Plan:  -Treatment options discussed including all alternatives, risks, and complications -Etiology of symptoms were discussed -X-rays were obtained and reviewed and independently reviewed with him.  Mild arthralgias with first MPJ mild bunion deformity.  No evidence of acute fracture. -We discussed with conservative as well as surgical treatment options.  Does not appear to clinically gout seems that there is been aspirated previously.  He was wanting surgical intervention.  He was molded for orthotics today we will do Morton's extension. -Should symptoms recur discussed repeat steroid injection.  Return in about 4 weeks (around 05/05/2020).  Vivi Barrack DPM

## 2020-05-05 ENCOUNTER — Ambulatory Visit: Payer: 59 | Admitting: Podiatry

## 2020-05-08 ENCOUNTER — Other Ambulatory Visit: Payer: Self-pay

## 2020-05-08 ENCOUNTER — Ambulatory Visit (INDEPENDENT_AMBULATORY_CARE_PROVIDER_SITE_OTHER): Payer: 59 | Admitting: Sports Medicine

## 2020-05-08 DIAGNOSIS — M25475 Effusion, left foot: Secondary | ICD-10-CM

## 2020-05-08 MED ORDER — TRAMADOL HCL 50 MG PO TABS: 50.0000 mg | ORAL_TABLET | Freq: Three times a day (TID) | ORAL | 3 refills | Status: DC | PRN

## 2020-05-08 NOTE — Progress Notes (Signed)
    Procedures performed today:    Procedure: Real-time Ultrasound Guided injection of the right third MCP Device: Samsung HS60  Verbal informed consent obtained.  Time-out conducted.  Noted no overlying erythema, induration, or other signs of local infection.  Skin prepped in a sterile fashion.  Local anesthesia: Topical Ethyl chloride.  With sterile technique and under real time ultrasound guidance:  1/2 cc lidocaine, 1/2 cc kenalog 40 injected easily, after the injection as I was applying the safety cover to the needle I accidentally stuck him in his fifth digit.  Everything was sterile, Band-Aid applied. Completed without difficulty  Pain immediately resolved suggesting accurate placement of the medication.  Advised to call if fevers/chills, erythema, induration, drainage, or persistent bleeding.  Images permanently stored and available for review in the ultrasound unit.  Impression: Technically successful ultrasound guided injection.  Independent interpretation of notes and tests performed by another provider:   None.  Brief History, Exam, Impression, and Recommendations:    Swelling of first metatarsophalangeal (MTP) joint of left foot Right third MCP injection as above, after the injection as I was applying the safety cover to the needle I accidentally stuck him in his fifth digit.  Everything was sterile, Band-Aid applied.   Apologies given, I will not charge for this visit.    ___________________________________________ Ihor Austin. Benjamin Stain, M.D., ABFM., CAQSM. Primary Care and Sports Medicine [place] [facility]  Adjunct Instructor of Family Medicine  [employer]

## 2020-05-08 NOTE — Assessment & Plan Note (Addendum)
Right third MCP injection as above, after the injection as I was applying the safety cover to the needle I accidentally stuck him in his fifth digit.  Everything was sterile, Band-Aid applied.   Apologies given, I will not charge for this visit.

## 2020-05-12 ENCOUNTER — Encounter: Payer: Self-pay | Admitting: Podiatry

## 2020-05-12 ENCOUNTER — Other Ambulatory Visit: Payer: Self-pay

## 2020-05-12 ENCOUNTER — Ambulatory Visit (INDEPENDENT_AMBULATORY_CARE_PROVIDER_SITE_OTHER): Payer: 59 | Admitting: Podiatry

## 2020-05-12 VITALS — Temp 98.2°F

## 2020-05-12 DIAGNOSIS — M21619 Bunion of unspecified foot: Secondary | ICD-10-CM

## 2020-05-12 DIAGNOSIS — M779 Enthesopathy, unspecified: Secondary | ICD-10-CM

## 2020-05-12 DIAGNOSIS — M205X9 Other deformities of toe(s) (acquired), unspecified foot: Secondary | ICD-10-CM | POA: Diagnosis not present

## 2020-05-12 MED ORDER — MELOXICAM 15 MG PO TABS: 15.0000 mg | ORAL_TABLET | Freq: Every day | ORAL | 0 refills | Status: DC

## 2020-05-12 NOTE — Progress Notes (Signed)
Subjective: 54 year old male presents here for pick up orthotics as well as for follow-up evaluation of left big toe joint pain.  He states that she still takes the pain medicine as well as anti-inflammatories, mostly when he is working.  He wears braces on concrete floors all day. Denies any systemic complaints such as fevers, chills, nausea, vomiting. No acute changes since last appointment, and no other complaints at this time.   Objective: AAO x3, NAD DP/PT pulses palpable bilaterally, CRT less than 3 seconds And significant pain today.  There is no edema, erythema.  Mild restriction of first MPJ range of motion and bunion deformities present.  No other areas of tenderness identified. No pain with calf compression, swelling, warmth, erythema  Assessment: Left foot first MPJ capsulitis, hallux limitus/bunion  Plan: -All treatment options discussed with the patient including all alternatives, risks, complications.  -Orthotics were dispensed.  Oral and written break-in instructions were discussed. -Anti-inflammatories as needed.  Refill today. -We will see how the orthotics for the 1st ray cut out does there is no improvement I will send the orthotics back to do [facility] extension. -Patient encouraged to call the office with any questions, concerns, change in symptoms.   Vivi Barrack DPM

## 2020-05-12 NOTE — Patient Instructions (Signed)

## 2020-06-16 ENCOUNTER — Ambulatory Visit: Payer: 59 | Admitting: Podiatry

## 2020-08-18 ENCOUNTER — Ambulatory Visit (INDEPENDENT_AMBULATORY_CARE_PROVIDER_SITE_OTHER): Payer: 59

## 2020-08-18 ENCOUNTER — Ambulatory Visit (INDEPENDENT_AMBULATORY_CARE_PROVIDER_SITE_OTHER): Payer: 59 | Admitting: Sports Medicine

## 2020-08-18 DIAGNOSIS — M19041 Primary osteoarthritis, right hand: Secondary | ICD-10-CM

## 2020-08-18 DIAGNOSIS — M19079 Primary osteoarthritis, unspecified ankle and foot: Secondary | ICD-10-CM

## 2020-08-18 MED ORDER — MELOXICAM 15 MG PO TABS: 15.0000 mg | ORAL_TABLET | Freq: Every day | ORAL | 3 refills | Status: DC

## 2020-08-18 NOTE — Assessment & Plan Note (Signed)
This pleasant 54 year old male returns, he has right third MCP osteoarthritis and synovitis, visible on ultrasound today, last injection was February of this year, having recurrence of pain, repeat injection today, return as needed.

## 2020-08-18 NOTE — Progress Notes (Signed)
    Procedures performed today:    Procedure: Real-time Ultrasound Guided injection of the right third MCP Device: Samsung HS60  Verbal informed consent obtained.  Time-out conducted.  Noted no overlying erythema, induration, or other signs of local infection.  Skin prepped in a sterile fashion.  Local anesthesia: Topical Ethyl chloride.  With sterile technique and under real time ultrasound guidance:  Noted copious synovitis, 1/2 cc kenalog 40, 1/2 cc lidocaine injected easily.   Completed without difficulty  Advised to call if fevers/chills, erythema, induration, drainage, or persistent bleeding.  Images permanently stored and available for review in PACS.  Impression: Technically successful ultrasound guided injection.  Independent interpretation of notes and tests performed by another provider:   None.  Brief History, Exam, Impression, and Recommendations:    Primary osteoarthritis of right hand This pleasant 54 year old male returns, he has right third MCP osteoarthritis and synovitis, visible on ultrasound today, last injection was February of this year, having recurrence of pain, repeat injection today, return as needed.    ___________________________________________ Ihor Austin. Benjamin Stain, M.D., ABFM., CAQSM. Primary Care and Sports Medicine [facility]  Adjunct Instructor of Family Medicine  [employer]

## 2020-10-02 ENCOUNTER — Emergency Department (INDEPENDENT_AMBULATORY_CARE_PROVIDER_SITE_OTHER)
Admission: EM | Admit: 2020-10-02 | Discharge: 2020-10-02 | Disposition: A | Discharge status: Home / Self Care | Payer: 59 | Source: Home / Self Care

## 2020-10-02 ENCOUNTER — Encounter: Payer: Self-pay | Admitting: Emergency Medicine

## 2020-10-02 ENCOUNTER — Other Ambulatory Visit: Payer: Self-pay

## 2020-10-02 DIAGNOSIS — J069 Acute upper respiratory infection, unspecified: Secondary | ICD-10-CM

## 2020-10-02 LAB — POC SARS CORONAVIRUS 2 AG -  ED: SARS Coronavirus 2 Ag: NEGATIVE

## 2020-10-02 MED ORDER — IBUPROFEN 600 MG PO TABS
600.0000 mg | ORAL_TABLET | ORAL | Status: AC
  Administered 2020-10-02: 600 mg via ORAL

## 2020-10-02 NOTE — ED Triage Notes (Signed)
Cough and congestion started last night -  no OTC meds  Felt warm at home - no thermometer Temp 100 oral in triage  Fatigue  Headache  No COVID & Flu  vaccine  Will need a work note

## 2020-10-02 NOTE — ED Provider Notes (Signed)
[facility]    CSN: [id number] Arrival date & time: 10/02/20  1346      History   Chief Complaint Chief Complaint  Patient presents with  . Nasal Congestion  . Cough    HPI Darren Mcdaniel is a 54 y.o. male.   HPI  Darren Mcdaniel is a 54 y.o. male presenting to UC with c/o mildly productive cough that stated last night, associated nasal congestion, subjective fever, HA, and fatigue. No medication taken PTA.  No COVID or flu vaccine. No known sick contacts. Needs note for work.    Past Medical History:  Diagnosis Date  . Arthritis     Patient Active Problem List   Diagnosis Date Noted  . Swelling of first metatarsophalangeal (MTP) joint of left foot 12/28/2019  . Viral syndrome 11/08/2019  . Left second and third MTP synovitis 06/23/2017  . Triceps tendinitis 12/12/2015  . Calcific tendinitis of left Quadriceps insertion 03/27/2015  . Primary osteoarthritis of right hand 03/27/2015  . Hyperlipidemia 03/16/2015  . Dupuytren's contracture of right hand 03/10/2015  . 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 Annual physical exam 03/10/2015  . 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 FRACTURE SURGERY     left arm '84       Home Medications    Prior to Admission medications   Medication Sig Start Date End Date Taking? Authorizing Provider  meloxicam (MOBIC) 15 MG tablet Take 1 tablet (15 mg total) by mouth daily. 08/18/20  Yes Monica Becton, MD  traMADol (ULTRAM) 50 MG tablet Take 1-2 tablets (50-100 mg total) by mouth every 8 (eight) hours as needed for moderate pain. Maximum 6 tabs per day. 05/08/20  Yes Monica Becton, MD    Family History Family History  Problem Relation Age of Onset  . Diabetes Father   . Alcohol abuse Paternal Uncle   . Healthy Mother     Social History Social History   Tobacco Use  . Smoking status: Never Smoker  . Smokeless tobacco: Current  User    Types: Chew  Vaping Use  . Vaping Use: Never used  Substance Use Topics  . Alcohol use: Yes    Alcohol/week: 0.0 standard drinks  . Drug use: No     Allergies   Patient has no known allergies.   Review of Systems Review of Systems  Constitutional: Positive for fatigue and fever. Negative for chills.  HENT: Positive for congestion. Negative for ear pain, sore throat, trouble swallowing and voice change.   Respiratory: Positive for cough. Negative for shortness of breath.   Cardiovascular: Negative for chest pain and palpitations.  Gastrointestinal: Negative for abdominal pain, diarrhea, nausea and vomiting.  Musculoskeletal: Negative for arthralgias, back pain and myalgias.  Skin: Negative for rash.  Neurological: Positive for headaches. Negative for dizziness and light-headedness.  All other systems reviewed and are negative.    Physical Exam Triage Vital Signs ED Triage Vitals  Enc Vitals Group     BP 10/02/20 1402 136/90     Pulse Rate 10/02/20 1402 98     Resp 10/02/20 1402 17     Temp 10/02/20 1402 100 F (37.8 C)     Temp Source 10/02/20 1402 Oral     SpO2 10/02/20 1402 97 %     Weight 10/02/20 1407 168 lb (76.2 kg)     Height 10/02/20 1407 5\' 6"  (1.676 m)     Head  Circumference --      Peak Flow --      Pain Score 10/02/20 1406 2     Pain Loc --      Pain Edu? --      Excl. in GC? --    No data found.  Updated Vital Signs BP 136/90 (BP Location: Right Arm)   Pulse 98   Temp 100 F (37.8 C) (Oral)   Resp 17   Ht 5\' 6"  (1.676 m)   Wt 168 lb (76.2 kg)   SpO2 97%   BMI 27.12 kg/m   Visual Acuity Right Eye Distance:   Left Eye Distance:   Bilateral Distance:    Right Eye Near:   Left Eye Near:    Bilateral Near:     Physical Exam Vitals and nursing note reviewed.  Constitutional:      General: He is not in acute distress.    Appearance: Normal appearance. He is well-developed and well-nourished. He is not ill-appearing,  toxic-appearing or diaphoretic.  HENT:     Head: Normocephalic and atraumatic.     Right Ear: Tympanic membrane and ear canal normal.     Left Ear: Tympanic membrane and ear canal normal.     Nose: Nose normal.     Right Sinus: No maxillary sinus tenderness or frontal sinus tenderness.     Left Sinus: No maxillary sinus tenderness or frontal sinus tenderness.     Mouth/Throat:     Lips: Pink.     Mouth: Mucous membranes are moist.     Pharynx: Oropharynx is clear. Uvula midline. No pharyngeal swelling, oropharyngeal exudate, posterior oropharyngeal erythema or uvula swelling.  Eyes:     Extraocular Movements: EOM normal.  Cardiovascular:     Rate and Rhythm: Normal rate and regular rhythm.  Pulmonary:     Effort: Pulmonary effort is normal. No respiratory distress.     Breath sounds: Normal breath sounds. No stridor. No wheezing, rhonchi or rales.  Musculoskeletal:        General: Normal range of motion.     Cervical back: Normal range of motion and neck supple. No tenderness.  Lymphadenopathy:     Cervical: No cervical adenopathy.  Skin:    General: Skin is warm and dry.  Neurological:     Mental Status: He is alert and oriented to person, place, and time.  Psychiatric:        Mood and Affect: Mood and affect normal.        Behavior: Behavior normal.      UC Treatments / Results  Labs (all labs ordered are listed, but only abnormal results are displayed) Labs Reviewed  COVID-19, FLU A+B NAA  POC SARS CORONAVIRUS 2 AG -  ED    EKG   Radiology No results found.  Procedures Procedures (including critical care time)  Medications Ordered in UC Medications  ibuprofen (ADVIL) tablet 600 mg (600 mg Oral Given 10/02/20 1444)    Initial Impression / Assessment and Plan / UC Course  I have reviewed the triage vital signs and the nursing notes.  Pertinent labs & imaging results that were available during my care of the patient were reviewed by me and considered in my  medical decision making (see chart for details).    No evidence of bacterial infection at this time. COVID/Flu/RSV pending Encouraged symptomatic tx F/u with PCP as needed AVS given  Final Clinical Impressions(s) / UC Diagnoses   Final diagnoses:  Viral URI with cough  Discharge Instructions      You may take 500mg  acetaminophen every 4-6 hours or in combination with ibuprofen 400-600mg  every 6-8 hours as needed for pain, inflammation, and fever.  Be sure to well hydrated with clear liquids and get at least 8 hours of sleep at night, preferably more while sick.   Please follow up with family medicine in 1 week if needed.     ED Prescriptions    None     PDMP not reviewed this encounter.   , Lurene Shadow 10/02/20 1511

## 2020-10-02 NOTE — Discharge Instructions (Addendum)
  You may take 500mg acetaminophen every 4-6 hours or in combination with ibuprofen 400-600mg every 6-8 hours as needed for pain, inflammation, and fever.  Be sure to well hydrated with clear liquids and get at least 8 hours of sleep at night, preferably more while sick.   Please follow up with family medicine in 1 week if needed.   

## 2020-10-04 ENCOUNTER — Emergency Department (INDEPENDENT_AMBULATORY_CARE_PROVIDER_SITE_OTHER)
Admission: EM | Admit: 2020-10-04 | Discharge: 2020-10-04 | Disposition: A | Discharge status: Home / Self Care | Payer: 59 | Source: Home / Self Care

## 2020-10-04 DIAGNOSIS — J209 Acute bronchitis, unspecified: Secondary | ICD-10-CM

## 2020-10-04 LAB — COVID-19, FLU A+B NAA
Influenza A, NAA: NOT DETECTED
Influenza B, NAA: NOT DETECTED
SARS-CoV-2, NAA: NOT DETECTED

## 2020-10-04 MED ORDER — HYDROCOD POLST-CPM POLST ER 10-8 MG/5ML PO SUER: 5.0000 mL | Freq: Two times a day (BID) | ORAL | 0 refills | Status: DC | PRN

## 2020-10-04 MED ORDER — PREDNISONE 50 MG PO TABS: ORAL_TABLET | ORAL | 0 refills | Status: DC

## 2020-10-04 NOTE — ED Triage Notes (Signed)
Patient presents to Urgent Care with complaints of needing a work note extension since he was seen 3 days ago and is not better. Patient reports he had a covid, flu, rsv test (all negative) and still does not feel well enough to return to work.

## 2020-10-04 NOTE — ED Provider Notes (Addendum)
[name]    CSN: [id number] Arrival date & time: 10/04/20  1304      History   Chief Complaint Chief Complaint  Patient presents with  . Letter for School/Work         HPI Darren Mcdaniel is a 54 y.o. male.   Patient presents to Urgent Care with complaints of needing a work note extension since he was seen 3 days ago and is not better. Patient reports he had a covid, flu, rsv test (all negative) and still does not feel well enough to return to work.   Patient has recurrent bronchitis.  His symptoms of cough are disrupting sleep and he is having abdominal muscle soreness.  No fever.  Sinus congestion is also bothering him     Past Medical History:  Diagnosis Date  . Arthritis     Patient Active Problem List   Diagnosis Date Noted  . Swelling of first metatarsophalangeal (MTP) joint of left foot 12/28/2019  . Viral syndrome 11/08/2019  . Left second and third MTP synovitis 06/23/2017  . Triceps tendinitis 12/12/2015  . Calcific tendinitis of left Quadriceps insertion 03/27/2015  . Primary osteoarthritis of right hand 03/27/2015  . Hyperlipidemia 03/16/2015  . Dupuytren's contracture of right hand 03/10/2015  . 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 Annual physical exam 03/10/2015  . 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 FRACTURE SURGERY     left arm '84       Home Medications    Prior to Admission medications   Medication Sig Start Date End Date Taking? Authorizing Provider  chlorpheniramine-HYDROcodone (TUSSIONEX PENNKINETIC ER) 10-8 MG/5ML SUER Take 5 mLs by mouth every 12 (twelve) hours as needed for cough. 10/04/20   Elvina Sidle, MD  meloxicam (MOBIC) 15 MG tablet Take 1 tablet (15 mg total) by mouth daily. 08/18/20   Monica Becton, MD  predniSONE (DELTASONE) 50 MG tablet One daily with food 10/04/20   Elvina Sidle, MD  traMADol (ULTRAM) 50 MG tablet  Take 1-2 tablets (50-100 mg total) by mouth every 8 (eight) hours as needed for moderate pain. Maximum 6 tabs per day. 05/08/20   Monica Becton, MD    Family History Family History  Problem Relation Age of Onset  . Diabetes Father   . Alcohol abuse Paternal Uncle   . Healthy Mother     Social History Social History   Tobacco Use  . Smoking status: Never Smoker  . Smokeless tobacco: Current User    Types: Chew  Vaping Use  . Vaping Use: Never used  Substance Use Topics  . Alcohol use: Yes    Alcohol/week: 4.0 standard drinks    Types: 4 Standard drinks or equivalent per week  . Drug use: No     Allergies   Patient has no known allergies.   Review of Systems Review of Systems  HENT: Positive for congestion.   Respiratory: Positive for cough.      Physical Exam Triage Vital Signs ED Triage Vitals  Enc Vitals Group     BP 10/04/20 1335 134/90     Pulse Rate 10/04/20 1335 86     Resp 10/04/20 1335 15     Temp 10/04/20 1335 98.3 F (36.8 C)     Temp Source 10/04/20 1335 Oral     SpO2 10/04/20 1335 100 %     Weight --      Height --  Head Circumference --      Peak Flow --      Pain Score 10/04/20 1334 7     Pain Loc --      Pain Edu? --      Excl. in GC? --    No data found.  Updated Vital Signs BP 134/90 (BP Location: Right Arm)   Pulse 86   Temp 98.3 F (36.8 C) (Oral)   Resp 15   SpO2 100%    Physical Exam Vitals and nursing note reviewed.  HENT:     Head: Normocephalic.     Nose: Congestion present.  Eyes:     Conjunctiva/sclera: Conjunctivae normal.  Cardiovascular:     Rate and Rhythm: Normal rate.     Pulses: Normal pulses.  Pulmonary:     Effort: Pulmonary effort is normal.     Breath sounds: Rhonchi present.  Musculoskeletal:        General: Normal range of motion.     Cervical back: Normal range of motion and neck supple.  Skin:    General: Skin is warm and dry.  Neurological:     Mental Status: He is alert.   Psychiatric:        Mood and Affect: Mood normal.        Behavior: Behavior normal.      UC Treatments / Results  Labs (all labs ordered are listed, but only abnormal results are displayed) Labs Reviewed - No data to display  EKG   Radiology No results found.  Procedures Procedures (including critical care time)  Medications Ordered in UC Medications - No data to display  Initial Impression / Assessment and Plan / UC Course  I have reviewed the triage vital signs and the nursing notes.  Pertinent labs & imaging results that were available during my care of the patient were reviewed by me and considered in my medical decision making (see chart for details).    Final Clinical Impressions(s) / UC Diagnoses   Final diagnoses:  Acute bronchitis, unspecified organism   Discharge Instructions   None    ED Prescriptions    Medication Sig Dispense Auth. Provider   chlorpheniramine-HYDROcodone (TUSSIONEX PENNKINETIC ER) 10-8 MG/5ML SUER Take 5 mLs by mouth every 12 (twelve) hours as needed for cough. 100 mL Elvina Sidle, MD   predniSONE (DELTASONE) 50 MG tablet One daily with food 5 tablet Elvina Sidle, MD     PDMP not reviewed this encounter.   Elvina Sidle, MD 10/04/20 1411    Elvina Sidle, MD 10/04/20 1413

## 2021-01-05 ENCOUNTER — Other Ambulatory Visit: Payer: Self-pay

## 2021-01-05 ENCOUNTER — Ambulatory Visit (INDEPENDENT_AMBULATORY_CARE_PROVIDER_SITE_OTHER): Payer: 59

## 2021-01-05 ENCOUNTER — Ambulatory Visit (INDEPENDENT_AMBULATORY_CARE_PROVIDER_SITE_OTHER): Payer: 59 | Admitting: Sports Medicine

## 2021-01-05 DIAGNOSIS — M19041 Primary osteoarthritis, right hand: Secondary | ICD-10-CM

## 2021-01-05 NOTE — Progress Notes (Signed)
    Procedures performed today:    Procedure: Real-time Ultrasound Guided injection of the right third metacarpophalangeal joint Device: Samsung HS60  Verbal informed consent obtained.  Time-out conducted.  Noted no overlying erythema, induration, or other signs of local infection.  Skin prepped in a sterile fashion.  Local anesthesia: Topical Ethyl chloride.  With sterile technique and under real time ultrasound guidance:  Noted moderate synovitis, 1/2 cc kenalog 40, 1/2 cc lidocaine injected easily.   Completed without difficulty  Advised to call if fevers/chills, erythema, induration, drainage, or persistent bleeding.  Images permanently stored and available for review in PACS.  Impression: Technically successful ultrasound guided injection.  Independent interpretation of notes and tests performed by another provider:   None.  Brief History, Exam, Impression, and Recommendations:    Primary osteoarthritis of right hand Recurrence of synovitis and osteoarthritis right third metacarpal phalangeal joint, repeat injection today. Return to see me as needed.  Last injected in November 2021.    ___________________________________________ Ihor Austin. Benjamin Stain, M.D., ABFM., CAQSM. Primary Care and Sports Medicine [facility]  Adjunct Instructor of Family Medicine  [employer]

## 2021-01-05 NOTE — Assessment & Plan Note (Signed)
Recurrence of synovitis and osteoarthritis right third metacarpal phalangeal joint, repeat injection today. Return to see me as needed.  Last injected in November 2021.

## 2021-04-09 ENCOUNTER — Encounter: Payer: Self-pay | Admitting: Emergency Medicine

## 2021-04-09 ENCOUNTER — Other Ambulatory Visit: Payer: Self-pay

## 2021-04-09 ENCOUNTER — Emergency Department (INDEPENDENT_AMBULATORY_CARE_PROVIDER_SITE_OTHER)
Admission: EM | Admit: 2021-04-09 | Discharge: 2021-04-09 | Disposition: A | Discharge status: Home / Self Care | Payer: 59 | Source: Home / Self Care | Attending: Family Medicine | Admitting: Family Medicine

## 2021-04-09 DIAGNOSIS — H5711 Ocular pain, right eye: Secondary | ICD-10-CM | POA: Diagnosis not present

## 2021-04-09 NOTE — ED Provider Notes (Signed)
[facility]    CSN: [id number] Arrival date & time: 04/09/21  1318      History   Chief Complaint Chief Complaint  Patient presents with   Foreign Body in Eye    HPI Darren Mcdaniel is a 55 y.o. male.   HPI Patient is here for eye irritation and pain.  He was working on Friday and he thinks he may have gotten something in his eye.  No use uncertain.  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 claim.  He has no change in vision.  No drainage from the eye.  No photophobia.  He has a foreign body sensation.  Right eye upper lid.  He has rinsed his eye.  This has not helped. Patient is otherwise in good health and on no chronic medications.  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 Authorizing Provider  meloxicam (MOBIC) 15 MG tablet Take 1 tablet (15 mg total) by mouth daily. 08/18/20   Monica Becton, MD  traMADol (ULTRAM) 50 MG tablet Take 1-2 tablets (50-100 mg total) by mouth every 8 (eight) hours as needed for moderate pain. Maximum 6 tabs per day. 05/08/20 10/04/20  Monica Becton, MD    Family History Family History  Problem Relation Age of Onset   Diabetes Father    Alcohol  abuse Paternal Uncle    Healthy Mother     Social History Social History   Tobacco Use   Smoking status: Never   Smokeless tobacco: Current    Types: Chew  Vaping Use   Vaping Use: Never used  Substance Use Topics   Alcohol use: Yes    Alcohol/week: 4.0 standard drinks    Types: 4 Standard drinks or equivalent per week   Drug use: No     Allergies   Patient has no known allergies.   Review of Systems Review of Systems See HPI  Physical Exam Triage Vital Signs ED Triage Vitals  Enc Vitals Group     BP 04/09/21 1433 (!) 132/91     Pulse Rate 04/09/21 1433 79     Resp 04/09/21 1433 16     Temp 04/09/21 1433 99.1 F (37.3 C)     Temp Source 04/09/21 1433 Oral     SpO2 04/09/21 1433 98 %     Weight 04/09/21 1430 168 lb (76.2 kg)     Height 04/09/21 1430 5\' 6"  (1.676 m)     Head Circumference --      Peak Flow --      Pain Score 04/09/21 1430 2     Pain Loc --      Pain Edu? --  Excl. in GC? --    No data found.  Updated Vital Signs BP (!) 132/91 (BP Location: Right Arm)   Pulse 79   Temp 99.1 F (37.3 C) (Oral)   Resp 16   Ht 5\' 6"  (1.676 m)   Wt 76.2 kg   SpO2 98%   BMI 27.12 kg/m   Visual Acuity Right Eye Distance: 20/25 Left Eye Distance: 20/25 Bilateral Distance: with glasses      Physical Exam Constitutional:      General: He is not in acute distress.    Appearance: Normal appearance. He is well-developed and normal weight. He is not ill-appearing.  HENT:     Head: Normocephalic and atraumatic.  Eyes:     Extraocular Movements: Extraocular movements intact.     Conjunctiva/sclera: Conjunctivae normal.     Pupils: Pupils are equal, round, and reactive to light.     Comments: Eyes appear normal.  There is no injection.  Full mobility.  Lids are everted.  No foreign body seen.  Tetracaine applied.  After adequate anesthesia fluorescein was used.  No fluorescein uptake.  Eye is irrigated with saline.  Treated with erythromycin ointment   Cardiovascular:     Rate and Rhythm: Normal rate.  Pulmonary:     Effort: Pulmonary effort is normal. No respiratory distress.  Abdominal:     General: There is no distension.     Palpations: Abdomen is soft.  Musculoskeletal:        General: Normal range of motion.     Cervical back: Normal range of motion.  Skin:    General: Skin is warm and dry.  Neurological:     General: No focal deficit present.     Mental Status: He is alert.  Psychiatric:        Mood and Affect: Mood normal.        Behavior: Behavior normal.     UC Treatments / Results  Labs (all labs ordered are listed, but only abnormal results are displayed) Labs Reviewed - No data to display  EKG   Radiology No results found.  Procedures Procedures (including critical care time)  Medications Ordered in UC Medications - No data to display  Initial Impression / Assessment and Plan / UC Course  I have reviewed the triage vital signs and the nursing notes.  Pertinent labs & imaging results that were available during my care of the patient were reviewed by me and considered in my medical decision making (see chart for details).     No findings suggestive conjunctivitis foreign body or corneal abrasion.  Will give erythromycin ointment for use at home.  If eye pain persists more than an additional 48 hours, ophthalmology consultation recommended Final Clinical Impressions(s) / UC Diagnoses   Final diagnoses:  Eye pain, right     Discharge Instructions      Use the eye ointment 3-4 times a day  If your eye irritation persists see your eye doctor      ED Prescriptions   None    PDMP not reviewed this encounter.   , MD 04/09/21 1800

## 2021-04-09 NOTE — Discharge Instructions (Addendum)
Use the eye ointment 3-4 times a day  If your eye irritation persists see your eye doctor

## 2021-04-09 NOTE — ED Triage Notes (Signed)
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 Eyes appear open and no external edema or redness. Has not had covid vaccinations; did have covid 3/21.

## 2021-05-25 ENCOUNTER — Ambulatory Visit (INDEPENDENT_AMBULATORY_CARE_PROVIDER_SITE_OTHER): Payer: 59

## 2021-05-25 ENCOUNTER — Ambulatory Visit (INDEPENDENT_AMBULATORY_CARE_PROVIDER_SITE_OTHER): Payer: 59 | Admitting: Sports Medicine

## 2021-05-25 ENCOUNTER — Other Ambulatory Visit: Payer: Self-pay

## 2021-05-25 DIAGNOSIS — M1A9XX1 Chronic gout, unspecified, with tophus (tophi): Secondary | ICD-10-CM | POA: Diagnosis not present

## 2021-05-25 DIAGNOSIS — M19041 Primary osteoarthritis, right hand: Secondary | ICD-10-CM | POA: Diagnosis not present

## 2021-05-25 MED ORDER — COLCHICINE 0.6 MG PO TABS: ORAL_TABLET | ORAL | 11 refills | Status: DC

## 2021-05-25 NOTE — Progress Notes (Addendum)
    Procedures performed today:    None.  Independent interpretation of notes and tests performed by another provider:    Brief History, Exam, Impression, and Recommendations:    Tophaceous gout, left middle finger This is a pleasant 55 year old male, we have been treating him frequently for occasional swelling of his interphalangeal joints, more recently he had an episode with severe pain, redness, swelling of his right third finger. On exam he does have what appears to be a subcutaneous gouty tophus, no longer painful. We have never checked uric acid levels, I did order them a year ago but they were not drawn. As he is not in pain we are not can I do an aspiration, injection, I will give him some colchicine to take should his symptoms recur, we do need to check his uric acid levels today. If elevated we will likely start allopurinol.  Degenerative and erosive with gouty changes, uric acid level is very elevated, goal is less than 5, adding allopurinol, needs to recheck uric acid levels in approximately 1 month I would expect all joints including hands and fingers to start to feel a lot better when uric acid levels come down. Due to the presence of tophi on physical exam we will do high-dose allopurinol at 300 mg twice daily.   Chronic process with exacerbation and pharmacologic intervention.  ___________________________________________ Darren Mcdaniel. Darren Mcdaniel, M.D., ABFM., CAQSM. Primary Care and Sports Medicine [facility]  Adjunct Instructor of Family Medicine  [employer]

## 2021-05-25 NOTE — Assessment & Plan Note (Addendum)
This is a pleasant 55 year old male, we have been treating him frequently for occasional swelling of his interphalangeal joints, more recently he had an episode with severe pain, redness, swelling of his right third finger. On exam he does have what appears to be a subcutaneous gouty tophus, no longer painful. We have never checked uric acid levels, I did order them a year ago but they were not drawn. As he is not in pain we are not can I do an aspiration, injection, I will give him some colchicine to take should his symptoms recur, we do need to check his uric acid levels today. If elevated we will likely start allopurinol.  Degenerative and erosive with gouty changes, uric acid level is very elevated, goal is less than 5, adding allopurinol, needs to recheck uric acid levels in approximately 1 month I would expect all joints including hands and fingers to start to feel a lot better when uric acid levels come down. Due to the presence of tophi on physical exam we will do high-dose allopurinol at 300 mg twice daily.

## 2021-05-26 LAB — COMPREHENSIVE METABOLIC PANEL
AG Ratio: 1.7 (calc) (ref 1.0–2.5)
ALT: 21 U/L (ref 9–46)
AST: 20 U/L (ref 10–35)
Albumin: 4.2 g/dL (ref 3.6–5.1)
Alkaline phosphatase (APISO): 65 U/L (ref 35–144)
BUN: 10 mg/dL (ref 7–25)
CO2: 28 mmol/L (ref 20–32)
Calcium: 9.7 mg/dL (ref 8.6–10.3)
Chloride: 104 mmol/L (ref 98–110)
Creat: 0.91 mg/dL (ref 0.70–1.30)
Globulin: 2.5 g/dL (calc) (ref 1.9–3.7)
Glucose, Bld: 110 mg/dL — ABNORMAL HIGH (ref 65–99)
Potassium: 4 mmol/L (ref 3.5–5.3)
Sodium: 141 mmol/L (ref 135–146)
Total Bilirubin: 1.2 mg/dL (ref 0.2–1.2)
Total Protein: 6.7 g/dL (ref 6.1–8.1)

## 2021-05-26 LAB — CBC
HCT: 42.9 % (ref 38.5–50.0)
Hemoglobin: 14.2 g/dL (ref 13.2–17.1)
MCH: 30.4 pg (ref 27.0–33.0)
MCHC: 33.1 g/dL (ref 32.0–36.0)
MCV: 91.9 fL (ref 80.0–100.0)
MPV: 10.5 fL (ref 7.5–12.5)
Platelets: 243 10*3/uL (ref 140–400)
RBC: 4.67 10*6/uL (ref 4.20–5.80)
RDW: 11.9 % (ref 11.0–15.0)
WBC: 7.2 10*3/uL (ref 3.8–10.8)

## 2021-05-26 LAB — URIC ACID: Uric Acid, Serum: 9.5 mg/dL — ABNORMAL HIGH (ref 4.0–8.0)

## 2021-05-27 MED ORDER — ALLOPURINOL 300 MG PO TABS: 300.0000 mg | ORAL_TABLET | Freq: Two times a day (BID) | ORAL | 3 refills | Status: DC

## 2021-05-27 NOTE — Addendum Note (Signed)
Addended by: Monica Becton on: 05/27/2021 04:02 PM   Modules accepted: Orders

## 2021-06-22 ENCOUNTER — Ambulatory Visit (INDEPENDENT_AMBULATORY_CARE_PROVIDER_SITE_OTHER): Payer: 59 | Admitting: Sports Medicine

## 2021-06-22 ENCOUNTER — Ambulatory Visit (INDEPENDENT_AMBULATORY_CARE_PROVIDER_SITE_OTHER): Payer: 59

## 2021-06-22 DIAGNOSIS — M1A9XX1 Chronic gout, unspecified, with tophus (tophi): Secondary | ICD-10-CM | POA: Diagnosis not present

## 2021-06-22 DIAGNOSIS — M19041 Primary osteoarthritis, right hand: Secondary | ICD-10-CM | POA: Diagnosis not present

## 2021-06-22 NOTE — Assessment & Plan Note (Signed)
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 Return to see me as needed.

## 2021-06-22 NOTE — Progress Notes (Signed)
    Procedures performed today:    Procedure: Real-time Ultrasound Guided injection of the right third MCP Device: Samsung HS60  Verbal informed consent obtained.  Time-out conducted.  Noted no overlying erythema, induration, or other signs of local infection.  Skin prepped in a sterile fashion.  Local anesthesia: Topical Ethyl chloride.  With sterile technique and under real time ultrasound guidance: Noted arthritic changes and synovitis, 1/2 cc Liken, 1/2 cc kenalog 40 injected easily. Completed without difficulty  Advised to call if fevers/chills, erythema, induration, drainage, or persistent bleeding.  Images permanently stored and available for review in PACS.  Impression: Technically successful ultrasound guided injection.  Independent interpretation of notes and tests performed by another provider:   None.  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 Return to see me as needed.  Tophaceous gout, left middle finger We did note elevated uric acid levels as well as a tophi in his finger. He has been on 300 mg twice daily allopurinol now for about a month, rechecking uric acid levels and CMP today. Goal is less than 5.    ___________________________________________ Ihor Austin. Benjamin Stain, M.D., ABFM., CAQSM. Primary Care and Sports Medicine [facility]  Adjunct Instructor of Family Medicine  [employer]

## 2021-06-22 NOTE — Assessment & Plan Note (Signed)
We did note elevated uric acid levels as well as a tophi in his finger. He has been on 300 mg twice daily allopurinol now for about a month, rechecking uric acid levels and CMP today. Goal is less than 5.

## 2021-06-23 LAB — COMPREHENSIVE METABOLIC PANEL
AG Ratio: 1.6 (calc) (ref 1.0–2.5)
ALT: 25 U/L (ref 9–46)
AST: 27 U/L (ref 10–35)
Albumin: 4.2 g/dL (ref 3.6–5.1)
Alkaline phosphatase (APISO): 73 U/L (ref 35–144)
BUN: 10 mg/dL (ref 7–25)
CO2: 27 mmol/L (ref 20–32)
Calcium: 9.6 mg/dL (ref 8.6–10.3)
Chloride: 104 mmol/L (ref 98–110)
Creat: 0.93 mg/dL (ref 0.70–1.30)
Globulin: 2.7 g/dL (calc) (ref 1.9–3.7)
Glucose, Bld: 124 mg/dL — ABNORMAL HIGH (ref 65–99)
Potassium: 4.4 mmol/L (ref 3.5–5.3)
Sodium: 141 mmol/L (ref 135–146)
Total Bilirubin: 1 mg/dL (ref 0.2–1.2)
Total Protein: 6.9 g/dL (ref 6.1–8.1)

## 2021-06-23 LAB — URIC ACID: Uric Acid, Serum: 3.7 mg/dL — ABNORMAL LOW (ref 4.0–8.0)

## 2021-09-26 ENCOUNTER — Other Ambulatory Visit: Payer: Self-pay

## 2021-09-26 DIAGNOSIS — M19041 Primary osteoarthritis, right hand: Secondary | ICD-10-CM

## 2021-09-26 MED ORDER — MELOXICAM 15 MG PO TABS: 15.0000 mg | ORAL_TABLET | Freq: Every day | ORAL | 3 refills | Status: DC

## 2021-09-26 NOTE — Telephone Encounter (Signed)
Fax refill request from pharmacy for Meloxicam

## 2021-10-09 ENCOUNTER — Ambulatory Visit: Payer: 59 | Admitting: Sports Medicine

## 2021-10-12 ENCOUNTER — Ambulatory Visit (INDEPENDENT_AMBULATORY_CARE_PROVIDER_SITE_OTHER): Payer: 59 | Admitting: Sports Medicine

## 2021-10-12 ENCOUNTER — Ambulatory Visit (INDEPENDENT_AMBULATORY_CARE_PROVIDER_SITE_OTHER): Payer: 59

## 2021-10-12 ENCOUNTER — Other Ambulatory Visit: Payer: Self-pay

## 2021-10-12 DIAGNOSIS — M19041 Primary osteoarthritis, right hand: Secondary | ICD-10-CM

## 2021-10-12 DIAGNOSIS — M1A9XX1 Chronic gout, unspecified, with tophus (tophi): Secondary | ICD-10-CM

## 2021-10-12 NOTE — Assessment & Plan Note (Signed)
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

## 2021-10-12 NOTE — Progress Notes (Signed)
° ° °  Procedures performed today:    Procedure: Real-time Ultrasound Guided injection of the right third MCP Device: Samsung HS60  Verbal informed consent obtained.  Time-out conducted.  Noted no overlying erythema, induration, or other signs of local infection.  Skin prepped in a sterile fashion.  Local anesthesia: Topical Ethyl chloride.  With sterile technique and under real time ultrasound guidance: Noted synovitis, 1/2 cc lidocaine, 1/2 cc kenalog 40 injected easily. Completed without difficulty  Advised to call if fevers/chills, erythema, induration, drainage, or persistent bleeding.  Images permanently stored and available for review in PACS.  Impression: Technically successful ultrasound guided injection.  Independent interpretation of notes and tests performed by another provider:   None.  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  Tophaceous gout, left middle finger Allopurinol has brought uric acid levels down to 3, tophi have all but disappeared!    ___________________________________________ Ihor Austin. Benjamin Stain, M.D., ABFM., CAQSM. Primary Care and Sports Medicine [facility]  Adjunct Instructor of Family Medicine  [employer]

## 2021-10-12 NOTE — Assessment & Plan Note (Signed)
Allopurinol has brought uric acid levels down to 3, tophi have all but disappeared!

## 2021-10-29 ENCOUNTER — Other Ambulatory Visit: Payer: Self-pay

## 2021-10-29 DIAGNOSIS — M1A9XX1 Chronic gout, unspecified, with tophus (tophi): Secondary | ICD-10-CM

## 2021-10-29 MED ORDER — ALLOPURINOL 300 MG PO TABS: 300.0000 mg | ORAL_TABLET | Freq: Two times a day (BID) | ORAL | 3 refills | Status: DC

## 2021-10-29 NOTE — Telephone Encounter (Signed)
Fax request from [facility] for Allopurinol 300mg 

## 2021-11-09 ENCOUNTER — Encounter: Payer: Self-pay | Admitting: Emergency Medicine

## 2021-11-09 ENCOUNTER — Emergency Department (INDEPENDENT_AMBULATORY_CARE_PROVIDER_SITE_OTHER)
Admission: EM | Admit: 2021-11-09 | Discharge: 2021-11-09 | Disposition: A | Discharge status: Home / Self Care | Payer: 59 | Source: Home / Self Care

## 2021-11-09 ENCOUNTER — Other Ambulatory Visit: Payer: Self-pay

## 2021-11-09 DIAGNOSIS — J309 Allergic rhinitis, unspecified: Secondary | ICD-10-CM

## 2021-11-09 DIAGNOSIS — J0100 Acute maxillary sinusitis, unspecified: Secondary | ICD-10-CM

## 2021-11-09 DIAGNOSIS — R059 Cough, unspecified: Secondary | ICD-10-CM | POA: Diagnosis not present

## 2021-11-09 MED ORDER — BENZONATATE 200 MG PO CAPS: 200.0000 mg | ORAL_CAPSULE | Freq: Three times a day (TID) | ORAL | 0 refills | Status: AC | PRN

## 2021-11-09 MED ORDER — AZITHROMYCIN 250 MG PO TABS: 250.0000 mg | ORAL_TABLET | Freq: Every day | ORAL | 0 refills | Status: DC

## 2021-11-09 MED ORDER — FEXOFENADINE HCL 180 MG PO TABS: 180.0000 mg | ORAL_TABLET | Freq: Every day | ORAL | 0 refills | Status: DC

## 2021-11-09 NOTE — ED Provider Notes (Addendum)
[facility]    CSN: [id number] Arrival date & time: 11/09/21  1335      History   Chief Complaint No chief complaint on file.   HPI Darren Mcdaniel is a 56 y.o. male.   HPI 55 year old male presents with cough, fever, nasal congestion and sinus headache for 1 day.  DMH significant for viral syndrome and hyperlipidemia.  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 Authorizing Provider  azithromycin (ZITHROMAX) 250 MG tablet Take 1 tablet (250 mg total) by mouth daily. Take first 2 tablets together, then 1 every day until finished. 11/09/21  Yes Trevor Iha, FNP  benzonatate (TESSALON) 200 MG capsule Take 1 capsule (200 mg total) by mouth 3 (three) times daily as needed for up to 7 days for cough. 11/09/21 11/16/21 Yes Trevor Iha, FNP  fexofenadine [facility] ALLERGY) 180 MG tablet Take 1 tablet (180 mg total) by mouth daily for 15 days. 11/09/21 11/24/21 Yes Trevor Iha, FNP  allopurinol (ZYLOPRIM) 300 MG tablet Take 1 tablet (300 mg total) by mouth 2 (two) times daily. 10/29/21   Monica Becton, MD  colchicine 0.6 MG tablet 1 tab p.o. twice daily for a week for flares. 05/25/21    Monica Becton, MD  meloxicam (MOBIC) 15 MG tablet Take 1 tablet (15 mg total) by mouth daily. 09/26/21   Monica Becton, MD  traMADol (ULTRAM) 50 MG tablet Take 1-2 tablets (50-100 mg total) by mouth every 8 (eight) hours as needed for moderate pain. Maximum 6 tabs per day. 05/08/20 10/04/20  Monica Becton, MD    Family History Family History  Problem Relation Age of Onset   Diabetes Father    Alcohol abuse Paternal Uncle    Healthy Mother     Social History Social History   Tobacco Use   Smoking status: Never   Smokeless tobacco: Current    Types: Chew  Vaping Use   Vaping Use: Never used  Substance Use Topics   Alcohol use: Yes    Alcohol/week: 4.0 standard drinks    Types: 4 Standard drinks or equivalent per week   Drug use: No     Allergies   Patient has no known allergies.   Review of Systems Review of Systems  Constitutional:  Positive for fever.  HENT:  Positive for congestion and sinus pain.   Neurological:  Positive for headaches.  All other systems reviewed and are negative.   Physical Exam Triage Vital Signs ED Triage Vitals  Enc Vitals Group     BP 11/09/21 1355 (!) 142/95     Pulse Rate 11/09/21 1355 89     Resp 11/09/21 1355 18  Temp 11/09/21 1355 99 F (37.2 C)     Temp Source 11/09/21 1355 Oral     SpO2 11/09/21 1355 98 %     Weight 11/09/21 1356 168 lb (76.2 kg)     Height 11/09/21 1356 5\' 6"  (1.676 m)     Head Circumference --      Peak Flow --      Pain Score 11/09/21 1356 0     Pain Loc --      Pain Edu? --      Excl. in GC? --    No data found.  Updated Vital Signs BP (!) 142/95 (BP Location: Left Arm)    Pulse 89    Temp 99 F (37.2 C) (Oral)    Resp 18    Ht 5\' 6"  (1.676 m)    Wt 168 lb (76.2 kg)    SpO2 98%    BMI 27.12 kg/m   Physical Exam Vitals and nursing note reviewed.  Constitutional:      Appearance: Normal appearance. He is normal weight.  HENT:     Head: Normocephalic and atraumatic.      Right Ear: Tympanic membrane and external ear normal.     Left Ear: Tympanic membrane and external ear normal.     Ears:     Comments: Mild to moderate eustachian tube dysfunction noted bilaterally    Mouth/Throat:     Mouth: Mucous membranes are moist.     Pharynx: Oropharynx is clear.     Comments: Moderate amount of clear drainage of posterior oropharynx noted Eyes:     Extraocular Movements: Extraocular movements intact.     Conjunctiva/sclera: Conjunctivae normal.     Pupils: Pupils are equal, round, and reactive to light.  Cardiovascular:     Rate and Rhythm: Normal rate and regular rhythm.     Pulses: Normal pulses.     Heart sounds: Normal heart sounds.  Pulmonary:     Effort: Respiratory distress present.     Breath sounds: Normal breath sounds.  Musculoskeletal:        General: Normal range of motion.     Cervical back: Normal range of motion and neck supple.  Skin:    General: Skin is warm and dry.  Neurological:     General: No focal deficit present.     Mental Status: He is alert and oriented to person, place, and time.     UC Treatments / Results  Labs (all labs ordered are listed, but only abnormal results are displayed) Labs Reviewed - No data to display  EKG   Radiology No results found.  Procedures Procedures (including critical care time)  Medications Ordered in UC Medications - No data to display  Initial Impression / Assessment and Plan / UC Course  I have reviewed the triage vital signs and the nursing notes.  Pertinent labs & imaging results that were available during my care of the patient were reviewed by me and considered in my medical decision making (see chart for details).     MDM: 1.  Subacute maxillary sinusitis-Rx'd Zithromax, advised patient to hold medication and not start until Wednesday morning, 11/14/2021. Advised patient to take medication as directed with food to completion. Advised patient to hold medication and not  start until Wednesday morning, 11/14/2021.  Advised patient may take Allegra and Tessalon Perles now for the next 5 days for postnasal drainage/drip and cough.  Encouraged patient to increase daily water intake while taking these medications.  Work note provided per patient request prior to discharge.  Patient discharged home, hemodynamically stable. Final Clinical Impressions(s) / UC Diagnoses   Final diagnoses:  Cough, unspecified type  Subacute maxillary sinusitis  Allergic rhinitis, unspecified seasonality, unspecified trigger     Discharge Instructions      Advised patient to take medication as directed with food to completion. Advised patient to hold medication and not start until Wednesday morning, 11/14/2021.  Advised patient may take Allegra and Tessalon Perles now for the next 5 days for postnasal drainage/drip and cough.  Encouraged patient to increase daily water intake while taking these medications.     ED Prescriptions     Medication Sig Dispense Auth. Provider   azithromycin (ZITHROMAX) 250 MG tablet Take 1 tablet (250 mg total) by mouth daily. Take first 2 tablets together, then 1 every day until finished. 6 tablet Trevor Ihaagan, Adeena Bernabe, FNP   fexofenadine [facility](ALLEGRA ALLERGY) 180 MG tablet Take 1 tablet (180 mg total) by mouth daily for 15 days. 15 tablet Trevor Ihaagan, Shantae Vantol, FNP   benzonatate (TESSALON) 200 MG capsule Take 1 capsule (200 mg total) by mouth 3 (three) times daily as needed for up to 7 days for cough. 40 capsule Trevor Ihaagan, Shelaine Frie, FNP      PDMP not reviewed this encounter.   Trevor IhaRagan, Gabriela Giannelli, FNP 11/09/21 1427    Trevor Ihaagan, Maralyn Witherell, FNP 11/09/21 1427

## 2021-11-09 NOTE — Discharge Instructions (Addendum)
Advised patient to take medication as directed with food to completion. Advised patient to hold medication and not start until Wednesday morning, 11/14/2021.  Advised patient may take Allegra and Tessalon Perles now for the next 5 days for postnasal drainage/drip and cough.  Encouraged patient to increase daily water intake while taking these medications.

## 2021-11-09 NOTE — ED Triage Notes (Signed)
Cough, fever, sneezing, nasal congestion, sinus headache started yesterday. Unvaccinated

## 2022-02-05 ENCOUNTER — Ambulatory Visit (INDEPENDENT_AMBULATORY_CARE_PROVIDER_SITE_OTHER): Payer: 59

## 2022-02-05 ENCOUNTER — Ambulatory Visit (INDEPENDENT_AMBULATORY_CARE_PROVIDER_SITE_OTHER): Payer: 59 | Admitting: Sports Medicine

## 2022-02-05 DIAGNOSIS — M19041 Primary osteoarthritis, right hand: Secondary | ICD-10-CM | POA: Diagnosis not present

## 2022-02-05 NOTE — Assessment & Plan Note (Signed)
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 ?

## 2022-02-05 NOTE — Progress Notes (Signed)
? ? ?  Procedures performed today:   ? ?Procedure: Real-time Ultrasound Guided injection of the right third MCP ?Device: Samsung HS60  ?Verbal informed consent obtained.  ?Time-out conducted.  ?Noted no overlying erythema, induration, or other signs of local infection.  ?Skin prepped in a sterile fashion.  ?Local anesthesia: Topical Ethyl chloride.  ?With sterile technique and under real time ultrasound guidance: Noted synovitis, 1/2 cc lidocaine, 1/2 cc kenalog 40 injected easily. ?Completed without difficulty  ?Advised to call if fevers/chills, erythema, induration, drainage, or persistent bleeding.  ?Images permanently stored and available for review in PACS.  ?Impression: Technically successful ultrasound guided injection. ? ?Independent interpretation of notes and tests performed by another provider:  ? ?None. ? ?Brief History, Exam, Impression, and Recommendations:   ? 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 ? ?Chronic process with exacerbation and pharmacologic intervention ? ?___________________________________________ ?Ihor Austin. Benjamin Stain, M.D., ABFM., CAQSM. ?Primary Care and Sports Medicine ?[facility] ? ?Adjunct Instructor of Family Medicine  ?[employer] ?

## 2022-04-02 ENCOUNTER — Encounter: Payer: Self-pay | Admitting: Emergency Medicine

## 2022-04-02 ENCOUNTER — Emergency Department (INDEPENDENT_AMBULATORY_CARE_PROVIDER_SITE_OTHER)
Admission: EM | Admit: 2022-04-02 | Discharge: 2022-04-02 | Disposition: A | Discharge status: Home / Self Care | Payer: 59 | Source: Home / Self Care

## 2022-04-02 DIAGNOSIS — M1A9XX1 Chronic gout, unspecified, with tophus (tophi): Secondary | ICD-10-CM

## 2022-04-02 DIAGNOSIS — M62830 Muscle spasm of back: Secondary | ICD-10-CM | POA: Diagnosis not present

## 2022-04-02 DIAGNOSIS — S29012A Strain of muscle and tendon of back wall of thorax, initial encounter: Secondary | ICD-10-CM

## 2022-04-02 DIAGNOSIS — M1A9XX0 Chronic gout, unspecified, without tophus (tophi): Secondary | ICD-10-CM

## 2022-04-02 MED ORDER — METHYLPREDNISOLONE 4 MG PO TBPK: ORAL_TABLET | ORAL | 0 refills | Status: DC

## 2022-04-02 MED ORDER — ALLOPURINOL 300 MG PO TABS: 300.0000 mg | ORAL_TABLET | Freq: Two times a day (BID) | ORAL | 3 refills | Status: DC

## 2022-04-02 MED ORDER — CYCLOBENZAPRINE HCL 10 MG PO TABS: 10.0000 mg | ORAL_TABLET | Freq: Two times a day (BID) | ORAL | 0 refills | Status: DC | PRN

## 2022-04-02 NOTE — ED Provider Notes (Signed)
[name]    CSN: [id number] Arrival date & time: 04/02/22  1316      History   Chief Complaint Chief Complaint  Patient presents with   Back Pain    HPI Breaker Springer is a 56 y.o. male.   HPI  Pleasant 56 year old, known to me from prior visits.  Is here because he has pain in his mid back.  It is near his left middle shoulder blade.  It is worse with movement, deep breath or cough.  He did not have any known accident or injury but states that he does a lot of pushing pulling and lifting at his job.  He states that he has tried over-the-counter Tylenol and some heat.  These have not helped.  Patient also has chronic gout.  He is on allopurinol.  He states that he is due for a refill but has not yet heard from his primary care office.  His chart is reviewed.  It appears he is supposed to be on the allopurinol.  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 Authorizing Provider  cyclobenzaprine (FLEXERIL) 10 MG tablet Take 1 tablet (10 mg total) by mouth 2 (two) times daily as needed for muscle spasms. 04/02/22  Yes Eustace Moore, MD  methylPREDNISolone  (MEDROL DOSEPAK) 4 MG TBPK tablet tad 04/02/22  Yes Eustace Moore, MD  allopurinol (ZYLOPRIM) 300 MG tablet Take 1 tablet (300 mg total) by mouth 2 (two) times daily. 04/02/22   Eustace Moore, MD  colchicine 0.6 MG tablet 1 tab p.o. twice daily for a week for flares. 05/25/21   Monica Becton, MD  meloxicam (MOBIC) 15 MG tablet Take 1 tablet (15 mg total) by mouth daily. 09/26/21   Monica Becton, MD  traMADol (ULTRAM) 50 MG tablet Take 1-2 tablets (50-100 mg total) by mouth every 8 (eight) hours as needed for moderate pain. Maximum 6 tabs per day. 05/08/20 10/04/20  Monica Becton, MD    Family History Family History  Problem Relation Age of Onset   Diabetes Father    Alcohol abuse Paternal Uncle    Healthy Mother     Social History Social History   Tobacco Use   Smoking status: Never   Smokeless tobacco: Current    Types: Chew  Vaping Use   Vaping Use: Never used  Substance Use Topics   Alcohol use: Yes    Alcohol/week: 4.0 standard drinks of alcohol    Types: 4 Standard drinks or equivalent per week   Drug use: No     Allergies   Patient has no known allergies.  Review of Systems Review of Systems  See HPI Physical Exam Triage Vital Signs ED Triage Vitals  Enc Vitals Group     BP 04/02/22 1431 (!) 143/91     Pulse Rate 04/02/22 1431 81     Resp 04/02/22 1431 16     Temp 04/02/22 1431 98.4 F (36.9 C)     Temp Source 04/02/22 1431 Oral     SpO2 04/02/22 1431 100 %     Weight --      Height --      Head Circumference --      Peak Flow --      Pain Score 04/02/22 1433 7     Pain Loc --      Pain Edu? --      Excl. in GC? --    No data found.  Updated Vital Signs BP (!) 143/91 (BP Location: Right Arm)   Pulse 81   Temp 98.4 F (36.9 C) (Oral)   Resp 16   SpO2 100%      Physical Exam Constitutional:      General: He is not in acute distress.    Appearance: He is well-developed.  HENT:     Head: Normocephalic and  atraumatic.  Eyes:     Conjunctiva/sclera: Conjunctivae normal.     Pupils: Pupils are equal, round, and reactive to light.  Cardiovascular:     Rate and Rhythm: Normal rate.  Pulmonary:     Effort: Pulmonary effort is normal. No respiratory distress.  Abdominal:     General: There is no distension.     Palpations: Abdomen is soft.  Musculoskeletal:        General: Swelling and tenderness present. Normal range of motion.     Cervical back: Normal range of motion.       Back:  Skin:    General: Skin is warm and dry.  Neurological:     General: No focal deficit present.     Mental Status: He is alert.  Psychiatric:        Mood and Affect: Mood normal.        Behavior: Behavior normal.      UC Treatments / Results  Labs (all labs ordered are listed, but only abnormal results are displayed) Labs Reviewed - No data to display  EKG   Radiology No results found.  Procedures Procedures (including critical care time)  Medications Ordered in UC Medications - No data to display  Initial Impression / Assessment and Plan / UC Course  I have reviewed the triage vital signs and the nursing notes.  Pertinent labs & imaging results that were available during my care of the patient were reviewed by me and considered in my medical decision making (see chart for details).     Final Clinical Impressions(s) / UC Diagnoses   Final diagnoses:  Muscle spasm of back  Strain of rhomboid muscle, initial encounter     Discharge Instructions      Take the Medrol Dosepak as directed. This is a steroid anti-inflammatory medicine.  Take all of day 1 today Take the muscle relaxer as needed.  May cause drowsiness.  It is helpful at bedtime Ice or heat to area.  Gentle massage may help Rest left arm.  Avoid heavy lifting and use until symptoms improve See your doctor if not better by Monday Allopurinol is refilled   ED Prescriptions     Medication Sig Dispense Auth. Provider    allopurinol (ZYLOPRIM)  300 MG tablet Take 1 tablet (300 mg total) by mouth 2 (two) times daily. 60 tablet Eustace Moore, MD   methylPREDNISolone (MEDROL DOSEPAK) 4 MG TBPK tablet tad 21 tablet Eustace Moore, MD   cyclobenzaprine (FLEXERIL) 10 MG tablet Take 1 tablet (10 mg total) by mouth 2 (two) times daily as needed for muscle spasms. 20 tablet Eustace Moore, MD      PDMP not reviewed this encounter.   Eustace Moore, MD 04/02/22 [id number]

## 2022-04-02 NOTE — Discharge Instructions (Signed)
Take the Medrol Dosepak as directed. This is a steroid anti-inflammatory medicine.  Take all of day 1 today Take the muscle relaxer as needed.  May cause drowsiness.  It is helpful at bedtime Ice or heat to area.  Gentle massage may help Rest left arm.  Avoid heavy lifting and use until symptoms improve See your doctor if not better by Monday Allopurinol is refilled

## 2022-04-02 NOTE — ED Triage Notes (Signed)
Pt c/o upper right sided back pain that started on Saturday and has gotten worse. He has taken tylenol and mobic with no relief. Denies known injury.

## 2022-05-08 ENCOUNTER — Ambulatory Visit (INDEPENDENT_AMBULATORY_CARE_PROVIDER_SITE_OTHER): Payer: 59 | Admitting: Sports Medicine

## 2022-05-08 ENCOUNTER — Ambulatory Visit (INDEPENDENT_AMBULATORY_CARE_PROVIDER_SITE_OTHER): Payer: 59

## 2022-05-08 DIAGNOSIS — M19041 Primary osteoarthritis, right hand: Secondary | ICD-10-CM | POA: Diagnosis not present

## 2022-05-08 NOTE — Assessment & Plan Note (Signed)
Known right hand osteoarthritis, last injection was in April, repeat injection today.

## 2022-05-08 NOTE — Progress Notes (Signed)
    Procedures performed today:    Procedure: Real-time Ultrasound Guided injection of the right third MCP Device: Samsung HS60  Verbal informed consent obtained.  Time-out conducted.  Noted no overlying erythema, induration, or other signs of local infection.  Skin prepped in a sterile fashion.  Local anesthesia: Topical Ethyl chloride.  With sterile technique and under real time ultrasound guidance: Noted synovitis, 1/2 cc lidocaine, 1/2 cc kenalog 40 injected easily. Completed without difficulty  Advised to call if fevers/chills, erythema, induration, drainage, or persistent bleeding.  Images permanently stored and available for review in PACS.  Impression: Technically successful ultrasound guided injection.  Independent interpretation of notes and tests performed by another provider:   None.  Brief History, Exam, Impression, and Recommendations:    Primary osteoarthritis of right hand Known right hand osteoarthritis, last injection was in April, repeat injection today.    ____________________________________________ Ihor Austin. Benjamin Stain, M.D., ABFM., CAQSM., AME. Primary Care and Sports Medicine [facility]  Adjunct Professor of Family Medicine  [place] [employer] [facility] [employer]  Restaurant manager, fast food

## 2022-09-03 ENCOUNTER — Ambulatory Visit (INDEPENDENT_AMBULATORY_CARE_PROVIDER_SITE_OTHER): Payer: 59

## 2022-09-03 ENCOUNTER — Ambulatory Visit (INDEPENDENT_AMBULATORY_CARE_PROVIDER_SITE_OTHER): Payer: 59 | Admitting: Sports Medicine

## 2022-09-03 DIAGNOSIS — M1A9XX1 Chronic gout, unspecified, with tophus (tophi): Secondary | ICD-10-CM

## 2022-09-03 DIAGNOSIS — M19041 Primary osteoarthritis, right hand: Secondary | ICD-10-CM

## 2022-09-03 MED ORDER — ALLOPURINOL 300 MG PO TABS: 300.0000 mg | ORAL_TABLET | Freq: Two times a day (BID) | ORAL | 3 refills | Status: DC

## 2022-09-03 MED ORDER — COLCHICINE 0.6 MG PO TABS: ORAL_TABLET | ORAL | 11 refills | Status: DC

## 2022-09-03 MED ORDER — MELOXICAM 15 MG PO TABS
15.0000 mg | ORAL_TABLET | Freq: Every day | ORAL | 3 refills | Status: DC
Start: 2022-09-03 — End: 2023-09-25

## 2022-09-03 MED ORDER — TRIAMCINOLONE ACETONIDE 40 MG/ML IJ SUSP
40.0000 mg | Freq: Once | INTRAMUSCULAR | Status: AC
  Administered 2022-09-03: 40 mg via INTRAMUSCULAR

## 2022-09-03 NOTE — Progress Notes (Signed)
    Procedures performed today:    Procedure: Real-time Ultrasound Guided injection of the right third MCP Device: Samsung HS60  Verbal informed consent obtained.  Time-out conducted.  Noted no overlying erythema, induration, or other signs of local infection.  Skin prepped in a sterile fashion.  Local anesthesia: Topical Ethyl chloride.  With sterile technique and under real time ultrasound guidance: Noted synovitis and arthritic changes, 1/2 cc lidocaine, 1/2 cc Kenalog 40 injected easily. Completed without difficulty  Advised to call if fevers/chills, erythema, induration, drainage, or persistent bleeding.  Images permanently stored and available for review in PACS.  Impression: Technically successful ultrasound guided injection.  Independent interpretation of notes and tests performed by another provider:   None.  Brief History, Exam, Impression, and Recommendations:    Primary osteoarthritis of right hand Kyan returns, he is a pleasant 56 year old male, right hand osteoarthritis, last injected in July, repeat right third MCP injection today, return as needed.  Chronic process with exacerbation and pharmacologic intervention  ____________________________________________ Ihor Austin. Benjamin Stain, M.D., ABFM., CAQSM., AME. Primary Care and Sports Medicine [facility]  Adjunct Professor of Family Medicine  [place] of [facility] [employer]  Restaurant manager, fast food

## 2022-09-03 NOTE — Assessment & Plan Note (Addendum)
Darren Mcdaniel returns, he is a pleasant 56 year old male, right hand osteoarthritis, last injected in July, repeat right third MCP injection today, return as needed.

## 2022-10-31 ENCOUNTER — Ambulatory Visit (INDEPENDENT_AMBULATORY_CARE_PROVIDER_SITE_OTHER): Payer: 59

## 2022-10-31 ENCOUNTER — Ambulatory Visit (INDEPENDENT_AMBULATORY_CARE_PROVIDER_SITE_OTHER): Payer: 59 | Admitting: Sports Medicine

## 2022-10-31 DIAGNOSIS — S59901D Unspecified injury of right elbow, subsequent encounter: Secondary | ICD-10-CM | POA: Diagnosis not present

## 2022-10-31 DIAGNOSIS — S59901A Unspecified injury of right elbow, initial encounter: Secondary | ICD-10-CM | POA: Diagnosis not present

## 2022-10-31 DIAGNOSIS — M25521 Pain in right elbow: Secondary | ICD-10-CM | POA: Diagnosis not present

## 2022-10-31 MED ORDER — HYDROCODONE-ACETAMINOPHEN 5-325 MG PO TABS: 1.0000 | ORAL_TABLET | Freq: Three times a day (TID) | ORAL | 0 refills | Status: AC | PRN

## 2022-10-31 NOTE — Assessment & Plan Note (Addendum)
This is a pleasant 57 year old male, 2 weeks ago he started to have some pain right elbow lateral aspect, he worked through it, subsequently went bowling recently, felt a pop and then had severe pain, weakness. On exam he has tenderness at the common extensor tendon origin, distal biceps insertion, severe weakness to supination of the forearm, weakness to extension of the wrist all consistent with either tears of the common extensor tendon and/or distal biceps. Due to his high demand job I do think we should get a early diagnosis and potentially surgical intervention if he has full-thickness tears. Adding elbow x-rays, MRI, hydrocodone for pain, elbow sleeve, out of work for at least a week.  Update: MRI does show multiple pathologic findings, dominant findings are elbow joint osteoarthritis and near complete tearing of the distal biceps tendon, I would like Dr. Griffin Basil to weigh in.

## 2022-10-31 NOTE — Progress Notes (Addendum)
    Procedures performed today:    None.  Independent interpretation of notes and tests performed by another provider:   None.  Brief History, Exam, Impression, and Recommendations:    Injury of right elbow This is a pleasant 57 year old male, 2 weeks ago he started to have some pain right elbow lateral aspect, he worked through it, subsequently went bowling recently, felt a pop and then had severe pain, weakness. On exam he has tenderness at the common extensor tendon origin, distal biceps insertion, severe weakness to supination of the forearm, weakness to extension of the wrist all consistent with either tears of the common extensor tendon and/or distal biceps. Due to his high demand job I do think we should get a early diagnosis and potentially surgical intervention if he has full-thickness tears. Adding elbow x-rays, MRI, hydrocodone for pain, elbow sleeve, out of work for at least a week.  Update: MRI does show multiple pathologic findings, dominant findings are elbow joint osteoarthritis and near complete tearing of the distal biceps tendon, I would like Dr. Griffin Basil to weigh in.    ____________________________________________ Gwen Her. Dianah Field, M.D., ABFM., CAQSM., AME. Primary Care and Sports Medicine [facility]  Adjunct Professor of [place] of [facility] [employer] Medicine  Risk manager

## 2022-11-06 ENCOUNTER — Telehealth: Payer: Self-pay

## 2022-11-06 NOTE — Telephone Encounter (Signed)
Patient left VM and states needs extension on his work note please.

## 2022-11-06 NOTE — Telephone Encounter (Signed)
Done!  Pt can download it from mychart.

## 2022-11-10 ENCOUNTER — Ambulatory Visit (INDEPENDENT_AMBULATORY_CARE_PROVIDER_SITE_OTHER): Payer: 59

## 2022-11-10 DIAGNOSIS — M25521 Pain in right elbow: Secondary | ICD-10-CM | POA: Diagnosis not present

## 2022-11-10 DIAGNOSIS — S59901A Unspecified injury of right elbow, initial encounter: Secondary | ICD-10-CM

## 2022-11-12 ENCOUNTER — Other Ambulatory Visit: Payer: Self-pay

## 2022-11-12 NOTE — Addendum Note (Signed)
Addended by: Silverio Decamp on: 11/12/2022 03:21 PM   Modules accepted: Orders

## 2022-12-02 ENCOUNTER — Ambulatory Visit: Payer: 59 | Attending: Orthopaedic Surgery | Admitting: Rehabilitative and Restorative Service Providers"

## 2022-12-02 ENCOUNTER — Encounter: Payer: Self-pay | Admitting: Rehabilitative and Restorative Service Providers"

## 2022-12-02 ENCOUNTER — Other Ambulatory Visit: Payer: Self-pay

## 2022-12-02 DIAGNOSIS — R29898 Other symptoms and signs involving the musculoskeletal system: Secondary | ICD-10-CM | POA: Insufficient documentation

## 2022-12-02 DIAGNOSIS — R293 Abnormal posture: Secondary | ICD-10-CM | POA: Insufficient documentation

## 2022-12-02 DIAGNOSIS — M6281 Muscle weakness (generalized): Secondary | ICD-10-CM | POA: Diagnosis present

## 2022-12-02 DIAGNOSIS — S46219A Strain of muscle, fascia and tendon of other parts of biceps, unspecified arm, initial encounter: Secondary | ICD-10-CM | POA: Diagnosis present

## 2022-12-02 NOTE — Therapy (Signed)
OUTPATIENT PHYSICAL THERAPY SHOULDER EVALUATION   Patient Name: Darren Mcdaniel MRN: [id number] DOB:1966/02/24, 57 y.o., male Today's Date: 12/02/2022  END OF SESSION:  PT End of Session - 12/02/22 1423     Visit Number 1    Number of Visits 24    Date for PT Re-Evaluation 02/24/23    PT Start Time 1100    PT Stop Time 1149    PT Time Calculation (min) 49 min    Activity Tolerance Patient tolerated treatment well             Past Medical History:  Diagnosis Date   Arthritis    COVID-19 12/2019   Past Surgical History:  Procedure Laterality Date   FRACTURE SURGERY     left arm '84   Patient Active Problem List   Diagnosis Date Noted   Injury of right elbow 10/31/2022   Tophaceous gout, 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   Swelling of first metatarsophalangeal (MTP) joint of left foot 12/28/2019   Viral syndrome 11/08/2019   Triceps tendinitis 12/12/2015   Calcific tendinitis of left Quadriceps insertion 03/27/2015   Primary osteoarthritis of right hand 03/27/2015   Hyperlipidemia 03/16/2015   Dupuytren's contracture of right hand 03/10/2015   Flexor carpi radialis tenosynovitis, right 03/10/2015   Annual physical exam 03/10/2015   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    PCP: Dr Aundria Mems  REFERRING PROVIDER: Dr Ophelia Charter  REFERRING DIAG: Rt distal biceps repair   THERAPY DIAG:  Biceps tendon tear  Other symptoms and signs involving the musculoskeletal system  Abnormal posture  Muscle weakness (generalized)  Rationale for Evaluation and Treatment: Rehabilitation  ONSET DATE: injury 10/15/22; surgery 11/20/22  SUBJECTIVE:                                                                                                                                                                                      SUBJECTIVE STATEMENT: Patient reports that he injured Rt biceps 10/16/22 while bowling. He had some pain prior to the bowling but then significant increase in  pain when bowling. He underwent surgical repair of Rt distal biceps. He has had continued pain since surgery.   PERTINENT HISTORY: Fx Lt arm with ORIF ~40 yrs ago; Lt knee tendonitis  PAIN:  Are you having pain? Yes: NPRS scale: 0-5/10 Pain location: in inside of elbow  Pain description: sharp Aggravating factors: movement  Relieving factors: not moving   PRECAUTIONS: Other: see protocol - 1-6 weeks - no weight bearing > 2 pounds; hinged elbow brace unlocked to 30 deg extension; flexion PROM only; extension AROM/gentle PROM to tension free endpoint - increase 10 degrees per visit or position  of comfort; supination/pronation at 90 degrees elbow flexion PROM only   WEIGHT BEARING RESTRICTIONS: Yes no weight bearing Rt UE   FALLS:  Has patient fallen in last 6 months? 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 Plans to return to this type work.  Yard work, bEnvironmental consultant1 x/wk; gun range 6 times/year   PLOF: Independent  PATIENT GOALS: get arm strong and return to work and bowling   NEXT MD VISIT: 12/24/22  OBJECTIVE:   DIAGNOSTIC FINDINGS:  MRI 11/10/22 Rt elbow - 1. High-grade near-full thickness nonretracted tear of the distal biceps tendon just proximal to the insertion at the radial tuberosity. Small to moderate volume bicipitoradialis bursitis. 2. Severe tendinosis of the distal triceps tendon without tear. 3. Mild tendinosis at the origin of the common extensor tendons without tear. 4. Advanced fatty atrophy of the flexor carpi radialis muscle, which could reflect previous trauma or sequela of chronic denervation. 5. Early mild osteoarthritic changes of the elbow joint. No effusion.  PATIENT SURVEYS:  FOTO 4; Goal  51  COGNITION: Overall cognitive status: Within functional limits for tasks assessed     SENSATION: WFL  POSTURE: Patient presents with head forward posture with increased thoracic kyphosis; shoulders rounded and elevated; scapulae abducted and rotated along the thoracic spine; head of the humerus anterior in orientation.   UPPER EXTREMITY ROM: Rt shoulder elevation limited end ranges in brace; Rt elbow limited at 30 degrees in bledso hinged brace locked at 30 deg extension   Active ROM Right eval Left eval  Shoulder flexion    Shoulder extension    Shoulder abduction    Shoulder adduction    Shoulder internal rotation    Shoulder external rotation    Elbow flexion 90 WNL's  Elbow extension -30 WNL's   Wrist flexion    Wrist extension    Wrist ulnar deviation    Wrist radial deviation    Wrist pronation neutral   Wrist supination neutral    (Blank rows = not tested)  UPPER EXTREMITY MMT: strength not assessed resistively   MMT Right eval Left eval  Shoulder flexion    Shoulder extension    Shoulder abduction    Shoulder adduction    Shoulder internal rotation    Shoulder external rotation    Middle trapezius    Lower trapezius    Elbow flexion    Elbow extension    Wrist flexion    Wrist extension    Wrist ulnar deviation    Wrist radial deviation    Wrist pronation    Wrist supination    Grip strength (lbs)    (Blank rows = not tested)  PALPATION:  Muscular tightness Rt upper trap; leveator; pecs; proximal anterior shoulder   TODAY'S [facility] Adult PT Treatment:                                                DATE: 12/02/22 Therapeutic Exercise: Scap squeeze in supine 5 sec x 10 Scap squeeze sitting 5 sec x 5 Chin tuck supine 5 sec x 3 Chin tuck  sitting 5 sec x 5 Lateral cervical flexion/upper trap stretch 5 sec x 3  Manual Therapy: STM Rt upper trap into scapular area Rt shoulder  PROM Rt elbow in supination/pronation elbow at 90 degrees flexion to  tissue limits and pt tolerance  PROM Rt elbow flexion and extension ~ 90 deg flexion to -30 deg extension  Neuromuscular re-ed: Postural correction encouraging patient to engage posterior shoulder girdle and avoid sitting with Rt shoulder depressed  Therapeutic Activity:  Modalities: Ice at home  Self Care: Instructed in proper position for brace making sure the hinge is at the joint line Rt elbow  Avoid sitting in recliner or modify recliner to avoid rounded posture   PATIENT EDUCATION: Education details: POC; HEP Person educated: Patient Education method: Explanation, Demonstration, Tactile cues, Verbal cues, and Handouts Education comprehension: verbalized understanding, returned demonstration, verbal cues required, tactile cues required, and needs further education  HOME EXERCISE PROGRAM: Access Code: [email or web address] URL: [email or web address] Date: 12/02/2022 Prepared by: Gillermo Murdoch  Exercises - Supine Cervical Retraction with Towel  - 2 x daily - 7 x weekly - 1 sets - 5-10 reps - 10 sec  hold - Seated Cervical Retraction  - 2 x daily - 7 x weekly - 1-2 sets - 5-10 reps - 10 sec  hold - Supine Scapular Retraction  - 2 x daily - 7 x weekly - 1 sets - 10 reps - 5-10 sec  hold - Seated Scapular Retraction  - 2 x daily - 7 x weekly - 1-2 sets - 10 reps - 10 sec  hold - Seated Cervical Sidebending AROM  - 2 x daily - 7 x weekly - 1 sets - 5 reps - 5-10 sec  hold  ASSESSMENT:  CLINICAL IMPRESSION: Patient is a 57 y.o. male who was seen today for physical therapy evaluation and treatment s/p Rt distal biceps tendon repair 11/20/22. Injury to Rt elbow reported 10/16/22.    OBJECTIVE IMPAIRMENTS: decreased mobility, decreased ROM, decreased strength, hypomobility, increased edema, increased fascial restrictions, increased muscle spasms, impaired flexibility, impaired UE functional use, improper body mechanics, postural dysfunction, and pain.   ACTIVITY LIMITATIONS: carrying,  lifting, toileting, dressing, self feeding, reach over head, and hygiene/grooming  PARTICIPATION LIMITATIONS: meal prep, cleaning, laundry, driving, shopping, community activity, occupation, and yard work  PERSONAL FACTORS: Fitness, Past/current experiences, and Profession are also affecting patient's functional outcome.   REHAB POTENTIAL: Good  CLINICAL DECISION MAKING: Stable/uncomplicated  EVALUATION COMPLEXITY: Low   GOALS: Goals reviewed with patient? Yes  SHORT TERM GOALS: Target date: 01/27/2023   Increase ROM Rt elbow flexion/extension to full range Baseline: Goal status: INITIAL  2.  Begin active exercise Rt UE per protocol  Baseline:  Goal status: INITIAL   LONG TERM GOALS: Target date: 02/24/2023   Full pain free ROM Rt shoulder and elbow Baseline:  Goal status: INITIAL  2.  Increase strength Rt UE allowing patient to return to normal functional activities including preparation for RTW  Baseline:  Goal status: INITIAL  3.  Return to bowling as MD approves  Baseline:  Goal status: INITIAL  4.  Patient demonstrates improved posture and alignment with posterior shoulder girdle engaged  Baseline:  Goal status: INITIAL  5.  Independent in HEP including aquatic program as indicated  Baseline:  Goal status: INITIAL  6.  Improve functional limitation score to 51  Baseline: 4 Goal status: INITIAL  PLAN:  PT FREQUENCY: 2x/week  PT DURATION: 12 weeks  PLANNED INTERVENTIONS: Therapeutic exercises, Therapeutic activity, Neuromuscular  re-education, Patient/Family education, Self Care, Joint mobilization, Aquatic Therapy, Dry Needling, Electrical stimulation, Spinal mobilization, Cryotherapy, Moist heat, Taping, Vasopneumatic device, Ultrasound, Ionotophoresis 69m/ml Dexamethasone, Manual therapy, and Re-evaluation  PLAN FOR NEXT SESSION: review and progress exercises; manual therapy and PROM Rt UE per protocol; manual work; DN; modalities as  indicated   [employer] PT 12/02/2022, 2:26 PM

## 2022-12-04 ENCOUNTER — Encounter: Payer: Self-pay | Admitting: Rehabilitative and Restorative Service Providers"

## 2022-12-04 ENCOUNTER — Ambulatory Visit: Payer: 59 | Admitting: Rehabilitative and Restorative Service Providers"

## 2022-12-04 DIAGNOSIS — M6281 Muscle weakness (generalized): Secondary | ICD-10-CM

## 2022-12-04 DIAGNOSIS — S46219A Strain of muscle, fascia and tendon of other parts of biceps, unspecified arm, initial encounter: Secondary | ICD-10-CM | POA: Diagnosis not present

## 2022-12-04 DIAGNOSIS — R29898 Other symptoms and signs involving the musculoskeletal system: Secondary | ICD-10-CM

## 2022-12-04 DIAGNOSIS — R293 Abnormal posture: Secondary | ICD-10-CM

## 2022-12-04 NOTE — Therapy (Signed)
OUTPATIENT PHYSICAL THERAPY SHOULDER TREATMENT   Patient Name: Darren Mcdaniel MRN: [id number] DOB:May 02, 1966, 57 y.o., male Today's Date: 12/04/2022  END OF SESSION:  PT End of Session - 12/04/22 1059     Visit Number 2    Number of Visits 24    Date for PT Re-Evaluation 02/24/23    PT Start Time 1059    PT Stop Time 1147    PT Time Calculation (min) 48 min    Activity Tolerance Patient tolerated treatment well             Past Medical History:  Diagnosis Date   Arthritis    COVID-19 12/2019   Past Surgical History:  Procedure Laterality Date   FRACTURE SURGERY     left arm '84   Patient Active Problem List   Diagnosis Date Noted   Injury of right elbow 10/31/2022   Tophaceous gout, 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   Swelling of first metatarsophalangeal (MTP) joint of left foot 12/28/2019   Viral syndrome 11/08/2019   Triceps tendinitis 12/12/2015   Calcific tendinitis of left Quadriceps insertion 03/27/2015   Primary osteoarthritis of right hand 03/27/2015   Hyperlipidemia 03/16/2015   Dupuytren's contracture of right hand 03/10/2015   Flexor carpi radialis tenosynovitis, right 03/10/2015   Annual physical exam 03/10/2015   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    PCP: Dr Aundria Mems  REFERRING PROVIDER: Dr Ophelia Charter  REFERRING DIAG: Rt distal biceps repair   THERAPY DIAG:  Biceps tendon tear  Other symptoms and signs involving the musculoskeletal system  Abnormal posture  Muscle weakness (generalized)  Rationale for Evaluation and Treatment: Rehabilitation  ONSET DATE: injury 10/15/22; surgery 11/20/22  SUBJECTIVE:                                                                                                                                                                                      SUBJECTIVE STATEMENT:  Rt side of neck into the shoulder is sore and tight and hurts since initial visit.     PERTINENT HISTORY: Patient reports that he  injured Rt biceps 10/16/22 while bowling. He had some pain prior to the bowling but then significant increase in pain when bowling. He underwent surgical repair of Rt distal biceps. He has had continued pain since surgery. Fx Lt arm with ORIF ~40 yrs ago; Lt knee tendonitis  PAIN:  Are you having pain? Yes: NPRS scale: 3/10 Pain location: in inside of elbow  Pain description: sharp Aggravating factors: movement  Relieving factors: not moving   PRECAUTIONS: Other: see protocol - 1-6 weeks - no weight bearing > 2 pounds; hinged elbow brace unlocked to 30 deg  extension; flexion PROM only; extension AROM/gentle PROM to tension free endpoint - increase 10 degrees per visit or position of comfort; supination/pronation at 90 degrees elbow flexion PROM only   WEIGHT BEARING RESTRICTIONS: Yes no weight bearing Rt UE   FALLS:  Has patient fallen in last 6 months? 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 Plans to return to this type work.  Yard work, bEnvironmental consultant1 x/wk; gun range 6 times/year   PATIENT GOALS: get arm strong and return to work and bowling   NEXT MD VISIT: 12/24/22  OBJECTIVE:   DIAGNOSTIC FINDINGS:  MRI 11/10/22 Rt elbow - 1. High-grade near-full thickness nonretracted tear of the distal biceps tendon just proximal to the insertion at the radial tuberosity. Small to moderate volume bicipitoradialis bursitis. 2. Severe tendinosis of the distal triceps tendon without tear. 3. Mild tendinosis at the origin of the common extensor tendons without tear. 4. Advanced fatty atrophy of the flexor carpi radialis muscle, which could reflect previous trauma or sequela of chronic denervation. 5. Early mild osteoarthritic changes of the elbow joint.  No effusion.  PATIENT SURVEYS:  FOTO 4; Goal 51  COGNITION: Overall cognitive status: Within functional limits for tasks assessed     POSTURE: Patient presents with head forward posture with increased thoracic kyphosis; shoulders rounded and elevated; scapulae abducted and rotated along the thoracic spine; head of the humerus anterior in orientation.   UPPER EXTREMITY ROM: Rt shoulder elevation limited end ranges in brace; Rt elbow limited at 30 degrees in bledso hinged brace locked at 30 deg extension   Active ROM Right eval Left eval  Shoulder flexion    Shoulder extension    Shoulder abduction    Shoulder adduction    Shoulder internal rotation    Shoulder external rotation    Elbow flexion 90 WNL's  Elbow extension -30 WNL's   Wrist flexion    Wrist extension    Wrist ulnar deviation    Wrist radial deviation    Wrist pronation neutral   Wrist supination neutral    (Blank rows = not tested)  UPPER EXTREMITY MMT: strength not assessed resistively   MMT Right eval Left eval  Shoulder flexion    Shoulder extension    Shoulder abduction    Shoulder adduction    Shoulder internal rotation    Shoulder external rotation    Middle trapezius    Lower trapezius    Elbow flexion    Elbow extension    Wrist flexion    Wrist extension    Wrist ulnar deviation    Wrist radial deviation    Wrist pronation    Wrist supination    Grip strength (lbs)    (Blank rows = not tested)  PALPATION:  Muscular tightness Rt upper trap; leveator; pecs; proximal anterior shoulder   [facility] Adult PT  Treatment:                                                DATE: 12/04/22 Therapeutic Exercise: Scap squeeze sitting 5 sec x 5 Chin tuck sitting 5 sec x 5 Lateral  cervical flexion/upper trap stretch 5 sec x 3  Manual Therapy: STM Rt upper trap into scapular area Rt shoulder  PROM Rt elbow in supination/pronation elbow at 90 degrees flexion to tissue limits and pt tolerance   PROM Rt elbow flexion and extension ~ 90 deg flexion to -30 deg extension  Neuromuscular re-ed: Postural correction encouraging patient to engage posterior shoulder girdle and avoid sitting with Rt shoulder depressed  Therapeutic Activity:  Modalities: Trial of TENS for Rt upper trap x 15 min Moist heat Rt upper trap x 15 min  Ice for elbow at home  Self Care: Instructed in proper position for brace making sure the hinge is at the joint line Rt elbow  Avoid sitting in recliner or modify recliner to avoid rounded posture  Treatment:                                                DATE: 12/02/22 Therapeutic Exercise: Scap squeeze in supine 5 sec x 10 Scap squeeze sitting 5 sec x 5 Chin tuck supine 5 sec x 3 Chin tuck sitting 5 sec x 5 Lateral cervical flexion/upper trap stretch 5 sec x 3  Manual Therapy: STM Rt upper trap into scapular area Rt shoulder  PROM Rt elbow in supination/pronation elbow at 90 degrees flexion to tissue limits and pt tolerance  PROM Rt elbow flexion and extension ~ 90 deg flexion to -30 deg extension  Neuromuscular re-ed: Postural correction encouraging patient to engage posterior shoulder girdle and avoid sitting with Rt shoulder depressed  Therapeutic Activity:  Modalities: Ice at home  Self Care: Instructed in proper position for brace making sure the hinge is at the joint line Rt elbow  Avoid sitting in recliner or modify recliner to avoid rounded posture   PATIENT EDUCATION: Education details: POC; HEP Person educated: Patient Education method: Explanation, Demonstration, Tactile cues, Verbal cues, and Handouts Education comprehension: verbalized understanding, returned demonstration, verbal cues required, tactile cues required, and needs further education  HOME EXERCISE PROGRAM: Access Code: [email or web address] URL: [email or web address] Date: 12/02/2022 Prepared by: Gillermo Murdoch  Exercises - Supine Cervical Retraction with Towel  - 2 x  daily - 7 x weekly - 1 sets - 5-10 reps - 10 sec  hold - Seated Cervical Retraction  - 2 x daily - 7 x weekly - 1-2 sets - 5-10 reps - 10 sec  hold - Supine Scapular Retraction  - 2 x daily - 7 x weekly - 1 sets - 10 reps - 5-10 sec  hold - Seated Scapular Retraction  - 2 x daily - 7 x weekly - 1-2 sets - 10 reps - 10 sec  hold - Seated Cervical Sidebending AROM  - 2 x daily - 7 x weekly - 1 sets - 5 reps - 5-10 sec  hold  ASSESSMENT:  CLINICAL IMPRESSION:  Patient reports that he has soreness in the Rt neck and shoulder since last visit. Continued PROM Rt elbow extension, supination and pronation within protocol limits. Trial of TENS Rt upper trap with moist heat to address muscular tightness and pain in the Rt upper trap.      OBJECTIVE IMPAIRMENTS: Patient is a 57 y.o. male who was seen today for physical therapy evaluation and treatment s/p Rt distal biceps tendon repair 11/20/22. Injury to Rt elbow reported 10/16/22. Decreased mobility, decreased ROM, decreased strength, hypomobility,  increased edema, increased fascial restrictions, increased muscle spasms, impaired flexibility, impaired UE functional use, improper body mechanics, postural dysfunction, and pain.   GOALS: Goals reviewed with patient? Yes  SHORT TERM GOALS: Target date: 01/27/2023  Increase ROM Rt elbow flexion/extension to full range Baseline: Goal status: INITIAL  2.  Begin active exercise Rt UE per protocol  Baseline:  Goal status: INITIAL   LONG TERM GOALS: Target date: 02/24/2023   Full pain free ROM Rt shoulder and elbow Baseline:  Goal status: INITIAL  2.  Increase strength Rt UE allowing patient to return to normal functional activities including preparation for RTW  Baseline:  Goal status: INITIAL  3.  Return to bowling as MD approves  Baseline:  Goal status: INITIAL  4.  Patient demonstrates improved posture and alignment with posterior shoulder girdle engaged  Baseline:  Goal status: INITIAL  5.   Independent in HEP including aquatic program as indicated  Baseline:  Goal status: INITIAL  6.  Improve functional limitation score to 51  Baseline: 4 Goal status: INITIAL  PLAN:  PT FREQUENCY: 2x/week  PT DURATION: 12 weeks  PLANNED INTERVENTIONS: Therapeutic exercises, Therapeutic activity, Neuromuscular re-education, Patient/Family education, Self Care, Joint mobilization, Aquatic Therapy, Dry Needling, Electrical stimulation, Spinal mobilization, Cryotherapy, Moist heat, Taping, Vasopneumatic device, Ultrasound, Ionotophoresis 54m/ml Dexamethasone, Manual therapy, and Re-evaluation  PLAN FOR NEXT SESSION: review and progress exercises; manual therapy and PROM Rt UE per protocol; manual work; DN; modalities as indicated   [employer] PT 12/04/2022, 11:00 AM

## 2022-12-09 ENCOUNTER — Ambulatory Visit: Payer: 59 | Admitting: Rehabilitative and Restorative Service Providers"

## 2022-12-09 ENCOUNTER — Encounter: Payer: Self-pay | Admitting: Rehabilitative and Restorative Service Providers"

## 2022-12-09 DIAGNOSIS — S46219A Strain of muscle, fascia and tendon of other parts of biceps, unspecified arm, initial encounter: Secondary | ICD-10-CM | POA: Diagnosis not present

## 2022-12-09 DIAGNOSIS — M6281 Muscle weakness (generalized): Secondary | ICD-10-CM

## 2022-12-09 DIAGNOSIS — R293 Abnormal posture: Secondary | ICD-10-CM

## 2022-12-09 DIAGNOSIS — R29898 Other symptoms and signs involving the musculoskeletal system: Secondary | ICD-10-CM

## 2022-12-09 NOTE — Therapy (Signed)
OUTPATIENT PHYSICAL THERAPY SHOULDER TREATMENT   Patient Name: Darren Mcdaniel MRN: [id number] DOB:1965/11/23, 57 y.o., male Today's Date: 12/09/2022  END OF SESSION:  PT End of Session - 12/09/22 1104     Visit Number 3    Number of Visits 24    Date for PT Re-Evaluation 02/24/23    PT Start Time 1103    PT Stop Time 1145    PT Time Calculation (min) 42 min    Activity Tolerance Patient tolerated treatment well             Past Medical History:  Diagnosis Date   Arthritis    COVID-19 12/2019   Past Surgical History:  Procedure Laterality Date   FRACTURE SURGERY     left arm '84   Patient Active Problem List   Diagnosis Date Noted   Injury of right elbow 10/31/2022   Tophaceous gout, 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   Swelling of first metatarsophalangeal (MTP) joint of left foot 12/28/2019   Viral syndrome 11/08/2019   Triceps tendinitis 12/12/2015   Calcific tendinitis of left Quadriceps insertion 03/27/2015   Primary osteoarthritis of right hand 03/27/2015   Hyperlipidemia 03/16/2015   Dupuytren's contracture of right hand 03/10/2015   Flexor carpi radialis tenosynovitis, right 03/10/2015   Annual physical exam 03/10/2015   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    PCP: Dr Aundria Mems  REFERRING PROVIDER: Dr Ophelia Charter  REFERRING DIAG: Rt distal biceps repair   THERAPY DIAG:  Biceps tendon tear  Other symptoms and signs involving the musculoskeletal system  Abnormal posture  Muscle weakness (generalized)  Rationale for Evaluation and Treatment: Rehabilitation  ONSET DATE: injury 10/15/22; surgery 11/20/22  SUBJECTIVE:                                                                                                                                                                                      SUBJECTIVE STATEMENT:  Rt side of neck into the shoulder is not as sore and tight. Brace irritates the arm.  Thinks he is getting a flare upof gout in the Rt  elbow. He has meds at home but has not taken them yet. Havig some swelling in his hands.   PERTINENT HISTORY: Patient reports that he injured Rt biceps 10/16/22 while bowling. He had some pain prior to the bowling but then significant increase in pain when bowling. He underwent surgical repair of Rt distal biceps. He has had continued pain since surgery. Fx Lt arm with ORIF ~40 yrs ago; Lt knee tendonitis  PAIN:  Are you having pain? Yes: NPRS scale: 2/10 with movement; 0/10 when at rest  Pain location: in inside of  elbow  Pain description: sharp Aggravating factors: movement  Relieving factors: not moving   PRECAUTIONS: Other: see protocol - 1-6 weeks - no weight bearing > 2 pounds; hinged elbow brace unlocked to 30 deg extension; flexion PROM only; extension AROM/gentle PROM to tension free endpoint - increase 10 degrees per visit or position of comfort; supination/pronation at 90 degrees elbow flexion PROM only   WEIGHT BEARING RESTRICTIONS: Yes no weight bearing Rt UE   FALLS:  Has patient fallen in last 6 months? 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 Plans to return to this type work.  Yard work, bEnvironmental consultant1 x/wk; gun range 6 times/year   PATIENT GOALS: get arm strong and return to work and bowling   NEXT MD VISIT: 12/24/22  OBJECTIVE:   DIAGNOSTIC FINDINGS:  MRI 11/10/22 Rt elbow - 1. High-grade near-full thickness nonretracted tear of the distal biceps tendon just proximal to the insertion at the radial tuberosity. Small to moderate volume bicipitoradialis bursitis. 2. Severe tendinosis of the distal triceps tendon without tear. 3. Mild tendinosis at the origin of the common extensor tendons without tear. 4. Advanced fatty atrophy of the  flexor carpi radialis muscle, which could reflect previous trauma or sequela of chronic denervation. 5. Early mild osteoarthritic changes of the elbow joint. No effusion.  PATIENT SURVEYS:  FOTO 4; Goal 51  COGNITION: Overall cognitive status: Within functional limits for tasks assessed     POSTURE: Patient presents with head forward posture with increased thoracic kyphosis; shoulders rounded and elevated; scapulae abducted and rotated along the thoracic spine; head of the humerus anterior in orientation.   UPPER EXTREMITY ROM: Rt shoulder elevation limited end ranges in brace; Rt elbow limited at 30 degrees in bledso hinged brace locked at 30 deg extension   Active ROM Right eval Left eval  Shoulder flexion    Shoulder extension    Shoulder abduction    Shoulder adduction    Shoulder internal rotation    Shoulder external rotation    Elbow flexion 90 WNL's  Elbow extension -30 WNL's   Wrist flexion    Wrist extension    Wrist ulnar deviation    Wrist radial deviation    Wrist pronation neutral   Wrist supination neutral    (Blank rows = not tested)  UPPER EXTREMITY MMT: strength not assessed resistively   MMT Right eval Left eval  Shoulder flexion    Shoulder extension    Shoulder abduction    Shoulder adduction    Shoulder internal rotation    Shoulder external rotation    Middle trapezius    Lower trapezius    Elbow flexion    Elbow extension    Wrist flexion    Wrist extension    Wrist ulnar deviation    Wrist radial deviation    Wrist pronation    Wrist supination    Grip strength (lbs)    (Blank rows = not tested)  PALPATION:  Muscular tightness Rt upper trap; leveator; pecs; proximal anterior shoulder   [facility] Adult PT  Treatment:  DATE: 12/09/22 Therapeutic Exercise: Scap squeeze sitting 5 sec x 5 Chin tuck sitting 5 sec x 5 Lateral cervical flexion/upper trap stretch 5 sec x 3  Manual  Therapy: STM Rt upper trap into scapular area Rt shoulder  PROM Rt elbow in supination/pronation elbow at 90 degrees flexion to tissue limits and pt tolerance  PROM Rt elbow flexion and extension ~ 90 deg flexion to -20 deg extension  Neuromuscular re-ed: Postural correction encouraging patient to engage posterior shoulder girdle and avoid sitting with Rt shoulder depressed  Therapeutic Activity: Retrograde massage fingers to wrist to address edema Rt hand. Patient instructed in massage for home.  Self Care: Instructed in proper position for brace making sure the hinge is at the joint line Rt elbow  Avoid sitting in recliner or modify recliner to avoid rounded posture  Treatment:                                                DATE: 12/04/22 Therapeutic Exercise: Scap squeeze sitting 5 sec x 5 Chin tuck sitting 5 sec x 5 Lateral cervical flexion/upper trap stretch 5 sec x 3  Manual Therapy: STM Rt upper trap into scapular area Rt shoulder  PROM Rt elbow in supination/pronation elbow at 90 degrees flexion to tissue limits and pt tolerance  PROM Rt elbow flexion and extension ~ 90 deg flexion to -30 deg extension  Neuromuscular re-ed: Postural correction encouraging patient to engage posterior shoulder girdle and avoid sitting with Rt shoulder depressed  Therapeutic Activity:  Modalities: Trial of TENS for Rt upper trap x 15 min Moist heat Rt upper trap x 15 min  Ice for elbow at home  Self Care: Instructed in proper position for brace making sure the hinge is at the joint line Rt elbow  Avoid sitting in recliner or modify recliner to avoid rounded posture   PATIENT EDUCATION: Education details: POC; HEP Person educated: Patient Education method: Explanation, Demonstration, Tactile cues, Verbal cues, and Handouts Education comprehension: verbalized understanding, returned demonstration, verbal cues required, tactile cues required, and needs further education  HOME EXERCISE  PROGRAM: Access Code: [email or web address] URL: [email or web address] Date: 12/02/2022 Prepared by: Gillermo Murdoch  Exercises - Supine Cervical Retraction with Towel  - 2 x daily - 7 x weekly - 1 sets - 5-10 reps - 10 sec  hold - Seated Cervical Retraction  - 2 x daily - 7 x weekly - 1-2 sets - 5-10 reps - 10 sec  hold - Supine Scapular Retraction  - 2 x daily - 7 x weekly - 1 sets - 10 reps - 5-10 sec  hold - Seated Scapular Retraction  - 2 x daily - 7 x weekly - 1-2 sets - 10 reps - 10 sec  hold - Seated Cervical Sidebending AROM  - 2 x daily - 7 x weekly - 1 sets - 5 reps - 5-10 sec  hold  ASSESSMENT:  CLINICAL IMPRESSION:  Patient reports that he has less soreness in the Rt neck and shoulder. He is working on exercises and has changed the way he sits at home watching TV. Continued PROM Rt elbow extension, supination and pronation within protocol limits. Trial of isometric supination and pronation irritated elbow      OBJECTIVE IMPAIRMENTS: Patient is a 57 y.o. male who was seen today for physical therapy evaluation and treatment s/p Rt distal  biceps tendon repair 11/20/22. Injury to Rt elbow reported 10/16/22. Decreased mobility, decreased ROM, decreased strength, hypomobility, increased edema, increased fascial restrictions, increased muscle spasms, impaired flexibility, impaired UE functional use, improper body mechanics, postural dysfunction, and pain.   GOALS: Goals reviewed with patient? Yes  SHORT TERM GOALS: Target date: 01/27/2023  Increase ROM Rt elbow flexion/extension to full range Baseline: Goal status: INITIAL  2.  Begin active exercise Rt UE per protocol  Baseline:  Goal status: INITIAL   LONG TERM GOALS: Target date: 02/24/2023   Full pain free ROM Rt shoulder and elbow Baseline:  Goal status: INITIAL  2.  Increase strength Rt UE allowing patient to return to normal functional activities including preparation for RTW  Baseline:  Goal status: INITIAL  3.   Return to bowling as MD approves  Baseline:  Goal status: INITIAL  4.  Patient demonstrates improved posture and alignment with posterior shoulder girdle engaged  Baseline:  Goal status: INITIAL  5.  Independent in HEP including aquatic program as indicated  Baseline:  Goal status: INITIAL  6.  Improve functional limitation score to 51  Baseline: 4 Goal status: INITIAL  PLAN:  PT FREQUENCY: 2x/week  PT DURATION: 12 weeks  PLANNED INTERVENTIONS: Therapeutic exercises, Therapeutic activity, Neuromuscular re-education, Patient/Family education, Self Care, Joint mobilization, Aquatic Therapy, Dry Needling, Electrical stimulation, Spinal mobilization, Cryotherapy, Moist heat, Taping, Vasopneumatic device, Ultrasound, Ionotophoresis '4mg'$ /ml Dexamethasone, Manual therapy, and Re-evaluation  PLAN FOR NEXT SESSION: review and progress exercises; manual therapy and PROM Rt UE per protocol; manual work; DN; modalities as indicated   [employer], PT 12/09/2022, 11:47 AM

## 2022-12-11 ENCOUNTER — Encounter: Payer: Self-pay | Admitting: Rehabilitative and Restorative Service Providers"

## 2022-12-11 ENCOUNTER — Ambulatory Visit: Payer: 59 | Admitting: Rehabilitative and Restorative Service Providers"

## 2022-12-11 DIAGNOSIS — M6281 Muscle weakness (generalized): Secondary | ICD-10-CM

## 2022-12-11 DIAGNOSIS — R293 Abnormal posture: Secondary | ICD-10-CM

## 2022-12-11 DIAGNOSIS — R29898 Other symptoms and signs involving the musculoskeletal system: Secondary | ICD-10-CM

## 2022-12-11 DIAGNOSIS — S46219A Strain of muscle, fascia and tendon of other parts of biceps, unspecified arm, initial encounter: Secondary | ICD-10-CM | POA: Diagnosis not present

## 2022-12-11 NOTE — Therapy (Signed)
OUTPATIENT PHYSICAL THERAPY SHOULDER TREATMENT   Patient Name: Darren Mcdaniel MRN: [id number] DOB:03/12/1966, 57 y.o., male Today's Date: 12/11/2022  END OF SESSION:  PT End of Session - 12/11/22 1101     Visit Number 4    Number of Visits 24    Date for PT Re-Evaluation 02/24/23    PT Start Time 1100    PT Stop Time 1140    PT Time Calculation (min) 40 min    Activity Tolerance Patient tolerated treatment well             Past Medical History:  Diagnosis Date   Arthritis    COVID-19 12/2019   Past Surgical History:  Procedure Laterality Date   FRACTURE SURGERY     left arm '84   Patient Active Problem List   Diagnosis Date Noted   Injury of right elbow 10/31/2022   Tophaceous gout, 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   Swelling of first metatarsophalangeal (MTP) joint of left foot 12/28/2019   Viral syndrome 11/08/2019   Triceps tendinitis 12/12/2015   Calcific tendinitis of left Quadriceps insertion 03/27/2015   Primary osteoarthritis of right hand 03/27/2015   Hyperlipidemia 03/16/2015   Dupuytren's contracture of right hand 03/10/2015   Flexor carpi radialis tenosynovitis, right 03/10/2015   Annual physical exam 03/10/2015   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    PCP: Dr Aundria Mems  REFERRING PROVIDER: Dr Ophelia Charter  REFERRING DIAG: Rt distal biceps repair   THERAPY DIAG:  Biceps tendon tear  Other symptoms and signs involving the musculoskeletal system  Abnormal posture  Muscle weakness (generalized)  Rationale for Evaluation and Treatment: Rehabilitation  ONSET DATE: injury 10/15/22; surgery 11/20/22  SUBJECTIVE:                                                                                                                                                                                      SUBJECTIVE STATEMENT:  Rt side of neck into the shoulder is not as sore and tight. Brace irritates the arm. Taking medication for flare up of gout in the Rt  elbow. Having some persistent swelling in his hands.   PERTINENT HISTORY: Patient reports that he injured Rt biceps 10/16/22 while bowling. He had some pain prior to the bowling but then significant increase in pain when bowling. He underwent surgical repair of Rt distal biceps. He has had continued pain since surgery. Fx Lt arm with ORIF ~40 yrs ago; Lt knee tendonitis  PAIN:  Are you having pain? Yes: NPRS scale: 2/10 with movement; 0/10 when at rest  Pain location: in inside of elbow  Pain description: sharp Aggravating factors: movement  Relieving factors: not  moving   PRECAUTIONS: Other: see protocol - 1-6 weeks - no weight bearing > 2 pounds; hinged elbow brace unlocked to 30 deg extension; flexion PROM only; extension AROM/gentle PROM to tension free endpoint - increase 10 degrees per visit or position of comfort; supination/pronation at 90 degrees elbow flexion PROM only   WEIGHT BEARING RESTRICTIONS: Yes no weight bearing Rt UE   FALLS:  Has patient fallen in last 6 months? 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 Plans to return to this type work.  Yard work, bEnvironmental consultant1 x/wk; gun range 6 times/year   PATIENT GOALS: get arm strong and return to work and bowling   NEXT MD VISIT: 12/24/22  OBJECTIVE:   DIAGNOSTIC FINDINGS:  MRI 11/10/22 Rt elbow - 1. High-grade near-full thickness nonretracted tear of the distal biceps tendon just proximal to the insertion at the radial tuberosity. Small to moderate volume bicipitoradialis bursitis. 2. Severe tendinosis of the distal triceps tendon without tear. 3. Mild tendinosis at the origin of the common extensor tendons without tear. 4. Advanced fatty atrophy of the flexor carpi radialis muscle, which could  reflect previous trauma or sequela of chronic denervation. 5. Early mild osteoarthritic changes of the elbow joint. No effusion.  PATIENT SURVEYS:  FOTO 4; Goal 51  COGNITION: Overall cognitive status: Within functional limits for tasks assessed     POSTURE: Patient presents with head forward posture with increased thoracic kyphosis; shoulders rounded and elevated; scapulae abducted and rotated along the thoracic spine; head of the humerus anterior in orientation.   UPPER EXTREMITY ROM: Rt shoulder elevation limited end ranges in brace; Rt elbow limited at 30 degrees in bledso hinged brace locked at 30 deg extension   Active ROM Right eval Left eval  Shoulder flexion    Shoulder extension    Shoulder abduction    Shoulder adduction    Shoulder internal rotation    Shoulder external rotation    Elbow flexion 90 WNL's  Elbow extension -30 WNL's   Wrist flexion    Wrist extension    Wrist ulnar deviation    Wrist radial deviation    Wrist pronation neutral   Wrist supination neutral    (Blank rows = not tested)  UPPER EXTREMITY MMT: strength not assessed resistively   MMT Right eval Left eval  Shoulder flexion    Shoulder extension    Shoulder abduction    Shoulder adduction    Shoulder internal rotation    Shoulder external rotation    Middle trapezius    Lower trapezius    Elbow flexion    Elbow extension    Wrist flexion    Wrist extension    Wrist ulnar deviation    Wrist radial deviation    Wrist pronation    Wrist supination    Grip strength (lbs)    (Blank rows = not tested)  PALPATION:  Muscular tightness Rt upper trap; leveator; pecs; proximal anterior shoulder   [facility] Adult PT  Treatment:  DATE: 12/11/22 Therapeutic Exercise: Scap squeeze sitting 5 sec x 5 Chin tuck sitting 5 sec x 5 Lateral cervical flexion/upper trap stretch 5 sec x 3  Lateral cervical flexion to Rt w/axial extension;  Lt  w/chin tuck and slight Lt rotation 5 sec x 5  Assisted Rt shoulder abduction PT supporting LE x 5 with scap squeeze  Assisted shoudler ER/IR PT supporting Rt UE, elbow ~ 90 deg flexion x 5  Scap squeeze w/shoulder extension PT supporting Rt UE, elbow ~ 90 deg flexion sub max contraction 3-5 sec x 5   Manual Therapy: STM Rt upper trap into scapular area Rt shoulder  PROM Rt elbow in supination/pronation elbow at 90 degrees flexion to tissue limits and pt tolerance  PROM Rt elbow flexion and extension ~ 90 deg flexion to -20 deg extension  Neuromuscular re-ed: Postural correction encouraging patient to engage posterior shoulder girdle and avoid sitting with Rt shoulder depressed  Therapeutic Activity: Retrograde massage fingers to wrist to address edema Rt hand. Patient instructed in massage for home.  Self Care: Instructed in proper position for brace making sure the hinge is at the joint line Rt elbow  Avoid sitting in recliner or modify recliner to avoid rounded posture  Treatment:                                                DATE: 12/09/22 Therapeutic Exercise: Scap squeeze sitting 5 sec x 5 Chin tuck sitting 5 sec x 5 Lateral cervical flexion/upper trap stretch 5 sec x 3  Manual Therapy: STM Rt upper trap into scapular area Rt shoulder  PROM Rt elbow in supination/pronation elbow at 90 degrees flexion to tissue limits and pt tolerance  PROM Rt elbow flexion and extension ~ 90 deg flexion to -20 deg extension  Neuromuscular re-ed: Postural correction encouraging patient to engage posterior shoulder girdle and avoid sitting with Rt shoulder depressed  Therapeutic Activity: Retrograde massage fingers to wrist to address edema Rt hand. Patient instructed in massage for home.  Self Care: Instructed in proper position for brace making sure the hinge is at the joint line Rt elbow  Avoid sitting in recliner or modify recliner to avoid rounded posture  Treatment:                                                 DATE: 12/04/22 Therapeutic Exercise: Scap squeeze sitting 5 sec x 5 Chin tuck sitting 5 sec x 5 Lateral cervical flexion/upper trap stretch 5 sec x 3  Manual Therapy: STM Rt upper trap into scapular area Rt shoulder  PROM Rt elbow in supination/pronation elbow at 90 degrees flexion to tissue limits and pt tolerance  PROM Rt elbow flexion and extension ~ 90 deg flexion to -30 deg extension  Neuromuscular re-ed: Postural correction encouraging patient to engage posterior shoulder girdle and avoid sitting with Rt shoulder depressed  Therapeutic Activity:  Modalities: Trial of TENS for Rt upper trap x 15 min Moist heat Rt upper trap x 15 min  Ice for elbow at home  Self Care: Instructed in proper position for brace making sure the hinge is at the joint line Rt elbow  Avoid sitting in recliner or modify recliner to  avoid rounded posture   PATIENT EDUCATION: Education details: POC; HEP Person educated: Patient Education method: Explanation, Demonstration, Tactile cues, Verbal cues, and Handouts Education comprehension: verbalized understanding, returned demonstration, verbal cues required, tactile cues required, and needs further education  HOME EXERCISE PROGRAM: Access Code: [email or web address] URL: [email or web address] Date: 12/02/2022 Prepared by: Gillermo Murdoch  Exercises - Supine Cervical Retraction with Towel  - 2 x daily - 7 x weekly - 1 sets - 5-10 reps - 10 sec  hold - Seated Cervical Retraction  - 2 x daily - 7 x weekly - 1-2 sets - 5-10 reps - 10 sec  hold - Supine Scapular Retraction  - 2 x daily - 7 x weekly - 1 sets - 10 reps - 5-10 sec  hold - Seated Scapular Retraction  - 2 x daily - 7 x weekly - 1-2 sets - 10 reps - 10 sec  hold - Seated Cervical Sidebending AROM  - 2 x daily - 7 x weekly - 1 sets - 5 reps - 5-10 sec  hold  ASSESSMENT:  CLINICAL IMPRESSION:  Patient reports that he has less soreness in the Rt neck and shoulder. He is working  on exercises and has changed the way he sits at home watching TV. Continued PROM Rt elbow extension, supination and pronation within protocol limits. Trial of isometric scapular retraction with shoulder extension tolerated well. AAROM Rt shoulder tolerated well.       OBJECTIVE IMPAIRMENTS: Patient is a 57 y.o. male who was seen today for physical therapy evaluation and treatment s/p Rt distal biceps tendon repair 11/20/22. Injury to Rt elbow reported 10/16/22. Decreased mobility, decreased ROM, decreased strength, hypomobility, increased edema, increased fascial restrictions, increased muscle spasms, impaired flexibility, impaired UE functional use, improper body mechanics, postural dysfunction, and pain.   GOALS: Goals reviewed with patient? Yes  SHORT TERM GOALS: Target date: 01/27/2023  Increase ROM Rt elbow flexion/extension to full range Baseline: Goal status: INITIAL  2.  Begin active exercise Rt UE per protocol  Baseline:  Goal status: INITIAL   LONG TERM GOALS: Target date: 02/24/2023   Full pain free ROM Rt shoulder and elbow Baseline:  Goal status: INITIAL  2.  Increase strength Rt UE allowing patient to return to normal functional activities including preparation for RTW  Baseline:  Goal status: INITIAL  3.  Return to bowling as MD approves  Baseline:  Goal status: INITIAL  4.  Patient demonstrates improved posture and alignment with posterior shoulder girdle engaged  Baseline:  Goal status: INITIAL  5.  Independent in HEP including aquatic program as indicated  Baseline:  Goal status: INITIAL  6.  Improve functional limitation score to 51  Baseline: 4 Goal status: INITIAL  PLAN:  PT FREQUENCY: 2x/week  PT DURATION: 12 weeks  PLANNED INTERVENTIONS: Therapeutic exercises, Therapeutic activity, Neuromuscular re-education, Patient/Family education, Self Care, Joint mobilization, Aquatic Therapy, Dry Needling, Electrical stimulation, Spinal mobilization,  Cryotherapy, Moist heat, Taping, Vasopneumatic device, Ultrasound, Ionotophoresis '4mg'$ /ml Dexamethasone, Manual therapy, and Re-evaluation  PLAN FOR NEXT SESSION: review and progress exercises; manual therapy and PROM Rt UE per protocol; manual work; DN; modalities as indicated   [employer], PT 12/11/2022, 11:01 AM

## 2022-12-16 ENCOUNTER — Encounter: Payer: 59 | Admitting: Rehabilitative and Restorative Service Providers"

## 2022-12-18 ENCOUNTER — Encounter: Payer: Self-pay | Admitting: Rehabilitative and Restorative Service Providers"

## 2022-12-18 ENCOUNTER — Ambulatory Visit: Payer: 59 | Attending: Orthopaedic Surgery | Admitting: Rehabilitative and Restorative Service Providers"

## 2022-12-18 DIAGNOSIS — R293 Abnormal posture: Secondary | ICD-10-CM | POA: Diagnosis present

## 2022-12-18 DIAGNOSIS — S46219A Strain of muscle, fascia and tendon of other parts of biceps, unspecified arm, initial encounter: Secondary | ICD-10-CM | POA: Insufficient documentation

## 2022-12-18 DIAGNOSIS — R29898 Other symptoms and signs involving the musculoskeletal system: Secondary | ICD-10-CM | POA: Diagnosis present

## 2022-12-18 DIAGNOSIS — M6281 Muscle weakness (generalized): Secondary | ICD-10-CM | POA: Diagnosis present

## 2022-12-18 NOTE — Therapy (Signed)
OUTPATIENT PHYSICAL THERAPY SHOULDER TREATMENT   Patient Name: Darren Mcdaniel MRN: [id number] DOB:1965/11/16, 57 y.o., male Today's Date: 12/18/2022  END OF SESSION:  PT End of Session - 12/18/22 1100     Visit Number 5    Number of Visits 24    Date for PT Re-Evaluation 02/24/23    PT Start Time 1059    PT Stop Time 1140    PT Time Calculation (min) 41 min    Activity Tolerance Patient tolerated treatment well             Past Medical History:  Diagnosis Date   Arthritis    COVID-19 12/2019   Past Surgical History:  Procedure Laterality Date   FRACTURE SURGERY     left arm '84   Patient Active Problem List   Diagnosis Date Noted   Injury of right elbow 10/31/2022   Tophaceous gout, 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   Swelling of first metatarsophalangeal (MTP) joint of left foot 12/28/2019   Viral syndrome 11/08/2019   Triceps tendinitis 12/12/2015   Calcific tendinitis of left Quadriceps insertion 03/27/2015   Primary osteoarthritis of right hand 03/27/2015   Hyperlipidemia 03/16/2015   Dupuytren's contracture of right hand 03/10/2015   Flexor carpi radialis tenosynovitis, right 03/10/2015   Annual physical exam 03/10/2015   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    PCP: Dr Aundria Mems  REFERRING PROVIDER: Dr Ophelia Charter  REFERRING DIAG: Rt distal biceps repair   THERAPY DIAG:  Biceps tendon tear  Other symptoms and signs involving the musculoskeletal system  Abnormal posture  Muscle weakness (generalized)  Rationale for Evaluation and Treatment: Rehabilitation  ONSET DATE: injury 10/15/22; surgery 11/20/22  SUBJECTIVE:                                                                                                                                                                                      SUBJECTIVE STATEMENT:  Rt side of neck into the shoulder is not as sore and tight. Brace irritates the arm. Taking medication for flare up of gout in the Rt  elbow. Having some persistent swelling in Rt hand but decreasing.   PERTINENT HISTORY: Patient reports that he injured Rt biceps 10/16/22 while bowling. He had some pain prior to the bowling but then significant increase in pain when bowling. He underwent surgical repair of Rt distal biceps. He has had continued pain since surgery. Fx Lt arm with ORIF ~40 yrs ago; Lt knee tendonitis  PAIN:  Are you having pain? Yes: NPRS scale: 2/10 with movement; 0/10 when at rest  Pain location: in inside of elbow  Pain description: sharp Aggravating factors: movement  Relieving  factors: not moving   PRECAUTIONS: Other: see protocol - 1-6 weeks - no weight bearing > 2 pounds; hinged elbow brace unlocked to 30 deg extension; flexion PROM only; extension AROM/gentle PROM to tension free endpoint - increase 10 degrees per visit or position of comfort; supination/pronation at 90 degrees elbow flexion PROM only   WEIGHT BEARING RESTRICTIONS: Yes no weight bearing Rt UE   FALLS:  Has patient fallen in last 6 months? 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 Plans to return to this type work.  Yard work, bEnvironmental consultant1 x/wk; gun range 6 times/year   PATIENT GOALS: get arm strong and return to work and bowling   NEXT MD VISIT: 12/24/22  OBJECTIVE:   DIAGNOSTIC FINDINGS:  MRI 11/10/22 Rt elbow - 1. High-grade near-full thickness nonretracted tear of the distal biceps tendon just proximal to the insertion at the radial tuberosity. Small to moderate volume bicipitoradialis bursitis. 2. Severe tendinosis of the distal triceps tendon without tear. 3. Mild tendinosis at the origin of the common extensor tendons without tear. 4. Advanced fatty atrophy of the flexor carpi radialis  muscle, which could reflect previous trauma or sequela of chronic denervation. 5. Early mild osteoarthritic changes of the elbow joint. No effusion.  PATIENT SURVEYS:  FOTO 4; Goal 51  COGNITION: Overall cognitive status: Within functional limits for tasks assessed     POSTURE: Patient presents with head forward posture with increased thoracic kyphosis; shoulders rounded and elevated; scapulae abducted and rotated along the thoracic spine; head of the humerus anterior in orientation.   UPPER EXTREMITY ROM: Rt shoulder elevation limited end ranges in brace; Rt elbow limited at 30 degrees in bledso hinged brace locked at 30 deg extension   Active ROM Right eval Right  12/18/22 Left eval  Shoulder flexion     Shoulder extension     Shoulder abduction     Shoulder adduction     Shoulder internal rotation     Shoulder external rotation     Elbow flexion 90 100 WNL's  Elbow extension -30 -10 WNL's   Wrist flexion     Wrist extension     Wrist ulnar deviation     Wrist radial deviation     Wrist pronation neutral 65   Wrist supination neutral  60   (Blank rows = not tested)  UPPER EXTREMITY MMT: strength not assessed resistively   MMT Right eval Left eval  Shoulder flexion    Shoulder extension    Shoulder abduction    Shoulder adduction    Shoulder internal rotation    Shoulder external rotation    Middle trapezius    Lower trapezius    Elbow flexion    Elbow extension    Wrist flexion    Wrist extension    Wrist ulnar deviation    Wrist radial deviation    Wrist pronation    Wrist supination    Grip strength (lbs)    (Blank rows = not tested)  PALPATION:  Muscular tightness Rt upper trap; leveator; pecs; proximal anterior shoulder   TOD[facility] Adult PT  Treatment:  DATE: 12/11/22 Therapeutic Exercise: Scap squeeze sitting 5 sec x 5 Chin tuck sitting 5 sec x 5 Lateral cervical flexion/upper trap stretch 5 sec x  3  Lateral cervical flexion to Rt w/axial extension;  Lt w/chin tuck and slight Lt rotation 5 sec x 5  Assisted Rt shoulder abduction PT supporting LE x 5 with scap squeeze  Shoulder abduction isometric 3 sec x 10  Assisted shoulder ER/IR PT supporting Rt UE, elbow ~ 90 deg flexion x 10  Shoulder ext isometric in brace 3-5 sec x 10  Isometric supination/pronation elbow 90 deg flexion  3 sec x 10   HEP - LE's - wall squat; step up 6" step; walking  Manual Therapy: STM Rt upper trap into scapular area Rt shoulder  PROM Rt elbow in supination/pronation elbow at 90 degrees flexion to tissue limits and pt tolerance  PROM Rt elbow flexion and extension ~ 90 deg flexion to -10 deg extension  Neuromuscular re-ed: Postural correction encouraging patient to engage posterior shoulder girdle and avoid sitting with Rt shoulder depressed  Therapeutic Activity: HEP Retrograde massage fingers to wrist to address edema Rt hand.  Self Care: Instructed in proper position for brace making sure the hinge is at the joint line Rt elbow  Avoid sitting in recliner or modify recliner to avoid rounded posture  Treatment:                                                DATE: 12/09/22 Therapeutic Exercise: Scap squeeze sitting 5 sec x 5 Chin tuck sitting 5 sec x 5 Lateral cervical flexion/upper trap stretch 5 sec x 3  Manual Therapy: STM Rt upper trap into scapular area Rt shoulder  PROM Rt elbow in supination/pronation elbow at 90 degrees flexion to tissue limits and pt tolerance  PROM Rt elbow flexion and extension ~ 90 deg flexion to -20 deg extension  Neuromuscular re-ed: Postural correction encouraging patient to engage posterior shoulder girdle and avoid sitting with Rt shoulder depressed  Therapeutic Activity: Retrograde massage fingers to wrist to address edema Rt hand. Patient instructed in massage for home.  Self Care: Instructed in proper position for brace making sure the hinge is at the joint line Rt  elbow  Avoid sitting in recliner or modify recliner to avoid rounded posture  PATIENT EDUCATION: Education details: POC; HEP Person educated: Patient Education method: Explanation, Demonstration, Tactile cues, Verbal cues, and Handouts Education comprehension: verbalized understanding, returned demonstration, verbal cues required, tactile cues required, and needs further education  HOME EXERCISE PROGRAM: Access Code: [email or web address] URL: [email or web address] Date: 12/02/2022 Prepared by: Gillermo Murdoch  Exercises - Supine Cervical Retraction with Towel  - 2 x daily - 7 x weekly - 1 sets - 5-10 reps - 10 sec  hold - Seated Cervical Retraction  - 2 x daily - 7 x weekly - 1-2 sets - 5-10 reps - 10 sec  hold - Supine Scapular Retraction  - 2 x daily - 7 x weekly - 1 sets - 10 reps - 5-10 sec  hold - Seated Scapular Retraction  - 2 x daily - 7 x weekly - 1-2 sets - 10 reps - 10 sec  hold - Seated Cervical Sidebending AROM  - 2 x daily - 7 x weekly - 1 sets - 5 reps - 5-10 sec  hold  ASSESSMENT:  CLINICAL IMPRESSION:  Patient reports less soreness in the Rt neck and shoulder and decreased pain in the Rt elbow and forearm. He is working on exercises and has changed the way he sits at home watching TV. Continued PROM Rt elbow extension, supination and pronation within protocol limits. Isometric scapular retraction with shoulder extension tolerated well. AAROM and isometric exercise Rt shoulder tolerated well.       OBJECTIVE IMPAIRMENTS: Patient is a 57 y.o. male who was seen today for physical therapy evaluation and treatment s/p Rt distal biceps tendon repair 11/20/22. Injury to Rt elbow reported 10/16/22. Decreased mobility, decreased ROM, decreased strength, hypomobility, increased edema, increased fascial restrictions, increased muscle spasms, impaired flexibility, impaired UE functional use, improper body mechanics, postural dysfunction, and pain.   GOALS: Goals reviewed with patient?  Yes  SHORT TERM GOALS: Target date: 01/27/2023  Increase ROM Rt elbow flexion/extension to full range Baseline: Goal status: INITIAL  2.  Begin active exercise Rt UE per protocol  Baseline:  Goal status: INITIAL   LONG TERM GOALS: Target date: 02/24/2023   Full pain free ROM Rt shoulder and elbow Baseline:  Goal status: INITIAL  2.  Increase strength Rt UE allowing patient to return to normal functional activities including preparation for RTW  Baseline:  Goal status: INITIAL  3.  Return to bowling as MD approves  Baseline:  Goal status: INITIAL  4.  Patient demonstrates improved posture and alignment with posterior shoulder girdle engaged  Baseline:  Goal status: INITIAL  5.  Independent in HEP including aquatic program as indicated  Baseline:  Goal status: INITIAL  6.  Improve functional limitation score to 51  Baseline: 4 Goal status: INITIAL  PLAN:  PT FREQUENCY: 2x/week  PT DURATION: 12 weeks  PLANNED INTERVENTIONS: Therapeutic exercises, Therapeutic activity, Neuromuscular re-education, Patient/Family education, Self Care, Joint mobilization, Aquatic Therapy, Dry Needling, Electrical stimulation, Spinal mobilization, Cryotherapy, Moist heat, Taping, Vasopneumatic device, Ultrasound, Ionotophoresis '4mg'$ /ml Dexamethasone, Manual therapy, and Re-evaluation  PLAN FOR NEXT SESSION: review and progress exercises; manual therapy and PROM Rt UE per protocol; manual work; DN; modalities as indicated   [employer], PT 12/18/2022, 11:00 AM

## 2022-12-25 ENCOUNTER — Ambulatory Visit: Payer: 59 | Admitting: Rehabilitative and Restorative Service Providers"

## 2022-12-25 ENCOUNTER — Encounter: Payer: Self-pay | Admitting: Rehabilitative and Restorative Service Providers"

## 2022-12-25 DIAGNOSIS — M6281 Muscle weakness (generalized): Secondary | ICD-10-CM

## 2022-12-25 DIAGNOSIS — S46219A Strain of muscle, fascia and tendon of other parts of biceps, unspecified arm, initial encounter: Secondary | ICD-10-CM

## 2022-12-25 DIAGNOSIS — R29898 Other symptoms and signs involving the musculoskeletal system: Secondary | ICD-10-CM

## 2022-12-25 DIAGNOSIS — R293 Abnormal posture: Secondary | ICD-10-CM

## 2022-12-25 NOTE — Therapy (Signed)
OUTPATIENT PHYSICAL THERAPY SHOULDER TREATMENT   Patient Name: Darren Mcdaniel MRN: [id number] DOB:[birth year], 57 y.o., male Today's Date: 12/25/2022  END OF SESSION:  PT End of Session - 12/25/22 1052     Visit Number 6    Number of Visits 24    Date for PT Re-Evaluation 02/24/23    Authorization - Visit Number 6    Authorization - Number of Visits 30   combined PT/OT/speech 30 total prior to authorization required   PT Start Time [id number]    PT Stop Time 1132    PT Time Calculation (min) 38 min    Activity Tolerance Patient tolerated treatment well             Past Medical History:  Diagnosis Date   Arthritis    COVID-19 12/2019   Past Surgical History:  Procedure Laterality Date   FRACTURE SURGERY     left arm '84   Patient Active Problem List   Diagnosis Date Noted   Injury of right elbow 10/31/2022   Tophaceous gout, 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   Swelling of first metatarsophalangeal (MTP) joint of left foot 12/28/2019   Viral syndrome 11/08/2019   Triceps tendinitis 12/12/2015   Calcific tendinitis of left Quadriceps insertion 03/27/2015   Primary osteoarthritis of right hand 03/27/2015   Hyperlipidemia 03/16/2015   Dupuytren's contracture of right hand 03/10/2015   Flexor carpi radialis tenosynovitis, right 03/10/2015   Annual physical exam 03/10/2015   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    PCP: Dr Aundria Mems  REFERRING PROVIDER: Dr Ophelia Charter  REFERRING DIAG: Rt distal biceps repair   THERAPY DIAG:  Biceps tendon tear  Other symptoms and signs involving the musculoskeletal system  Abnormal posture  Muscle weakness (generalized)  Rationale for Evaluation and Treatment: Rehabilitation  ONSET DATE: injury 10/15/22; surgery 11/20/22  SUBJECTIVE:                                                                                                                                                                                      SUBJECTIVE  STATEMENT:  Saw the doctor yesterday and he is out of the brace. He has continued lifting restrictions of no more than 2# with no resistive exercise elbow flexion. No longer having problems with gout in the Rt elbow. Having some persistent swelling in Rt hand but decreasing.   PERTINENT HISTORY: Patient reports that he injured Rt biceps 10/16/22 while bowling. He had some pain prior to the bowling but then significant increase in pain when bowling. He underwent surgical repair of Rt distal biceps. He has had continued pain since surgery. Fx Lt arm with ORIF ~40 yrs ago; Lt  knee tendonitis  PAIN:  Are you having pain? Yes: NPRS scale: 3-4/10 with movement; 0/10 when at rest  Pain location: in inside of elbow  Pain description: aching tired  Aggravating factors: movement  Relieving factors: not moving   PRECAUTIONS: Other: see protocol - 1-6 weeks - no weight bearing > 2 pounds; hinged elbow brace unlocked to 30 deg extension; flexion PROM only; extension AROM/gentle PROM to tension free endpoint - increase 10 degrees per visit or position of comfort; supination/pronation at 90 degrees elbow flexion PROM only   WEIGHT BEARING RESTRICTIONS: Yes no weight bearing Rt UE   FALLS:  Has patient fallen in last 6 months? 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 Plans to return to this type work.  Yard work, bEnvironmental consultant1 x/wk; gun range 6 times/year   PATIENT GOALS: get arm strong and return to work and bowling   NEXT MD VISIT: 12/24/22  OBJECTIVE:   DIAGNOSTIC FINDINGS:  MRI 11/10/22 Rt elbow - 1. High-grade near-full thickness nonretracted tear of the distal biceps tendon just proximal to the insertion at the radial tuberosity. Small to moderate volume  bicipitoradialis bursitis. 2. Severe tendinosis of the distal triceps tendon without tear. 3. Mild tendinosis at the origin of the common extensor tendons without tear. 4. Advanced fatty atrophy of the flexor carpi radialis muscle, which could reflect previous trauma or sequela of chronic denervation. 5. Early mild osteoarthritic changes of the elbow joint. No effusion.  PATIENT SURVEYS:  FOTO 4; Goal 51  POSTURE: Patient presents with head forward posture with increased thoracic kyphosis; shoulders rounded and elevated; scapulae abducted and rotated along the thoracic spine; head of the humerus anterior in orientation.   UPPER EXTREMITY ROM: Rt shoulder elevation limited end ranges in brace; Rt elbow limited at 30 degrees in bledso hinged brace locked at 30 deg extension   Active ROM Right eval Right  12/18/22 Left eval  Shoulder flexion     Shoulder extension     Shoulder abduction     Shoulder adduction     Shoulder internal rotation     Shoulder external rotation     Elbow flexion 90 100 WNL's  Elbow extension -30 -10 WNL's   Wrist flexion     Wrist extension     Wrist ulnar deviation     Wrist radial deviation     Wrist pronation neutral 65   Wrist supination neutral  60   (Blank rows = not tested)  UPPER EXTREMITY MMT: strength not assessed resistively   MMT Right eval Left eval  Shoulder flexion    Shoulder extension    Shoulder abduction    Shoulder adduction    Shoulder internal rotation    Shoulder external rotation    Middle trapezius    Lower trapezius    Elbow flexion    Elbow extension    Wrist flexion    Wrist extension    Wrist ulnar deviation    Wrist radial deviation    Wrist pronation    Wrist supination    Grip strength (lbs)    (Blank rows = not tested)  PALPATION:  Muscular tightness Rt upper trap; leveator; pecs; proximal anterior shoulder   [facility] Adult PT  Treatment:  DATE:  12/25/22 Therapeutic Exercise: Scap squeeze sitting 5 sec x 5 Chin tuck sitting 5 sec x 5 Lateral cervical flexion/upper trap stretch 5 sec x 3  Lateral cervical flexion to Rt w/axial extension;  Lt w/chin tuck and slight Lt rotation 5 sec x 5  Assisted Rt shoulder abduction PT supporting LE x 5 with scap squeeze  Shoulder abduction isometric 3 sec x 10  Assisted shoulder ER/IR PT supporting Rt UE, elbow ~ 90 deg flexion x 10  Shoulder ext isometric 5 sec x 10 PT assist Isometric supination/pronation elbow 90 deg flexion  3 sec x 10   HEP - LE's - wall squat; step up 6" step; walking  Manual Therapy: STM Rt upper trap into scapular area Rt shoulder  PROM Rt elbow in supination/pronation elbow at 90 degrees flexion to tissue limits and pt tolerance  PROM Rt elbow flexion and extension ~ 90 deg flexion to -10 deg extension  Neuromuscular re-ed: Postural correction encouraging patient to engage posterior shoulder girdle and avoid sitting with Rt shoulder depressed  Therapeutic Activity: HEP - Retrograde massage fingers to wrist to address edema Rt hand.  Self Care: Suggested pt wean from brace and continue wearing brace at night for a few days  Avoid sitting in recliner or modify recliner to avoid rounded posture  Treatment:                                                DATE: 12/18/22 Therapeutic Exercise: Scap squeeze sitting 5 sec x 5 Chin tuck sitting 5 sec x 5 Lateral cervical flexion/upper trap stretch 5 sec x 3  Lateral cervical flexion to Rt w/axial extension;  Lt w/chin tuck and slight Lt rotation 5 sec x 5  Assisted Rt shoulder abduction PT supporting LE x 5 with scap squeeze  Shoulder abduction isometric 3 sec x 10  Assisted shoulder ER/IR PT supporting Rt UE, elbow ~ 90 deg flexion x 10  Shoulder ext isometric in brace 3-5 sec x 10  Isometric supination/pronation elbow 90 deg flexion  3 sec x 10   HEP - LE's - wall squat; step up 6" step; walking  Manual Therapy: STM Rt  upper trap into scapular area Rt shoulder  PROM Rt elbow in supination/pronation elbow at 90 degrees flexion to tissue limits and pt tolerance  PROM Rt elbow flexion and extension ~ 90 deg flexion to -10 deg extension  Neuromuscular re-ed: Postural correction encouraging patient to engage posterior shoulder girdle and avoid sitting with Rt shoulder depressed  Therapeutic Activity: HEP Retrograde massage fingers to wrist to address edema Rt hand.  Self Care: Instructed in proper position for brace making sure the hinge is at the joint line Rt elbow  Avoid sitting in recliner or modify recliner to avoid rounded posture  PATIENT EDUCATION: Education details: POC; HEP Person educated: Patient Education method: Explanation, Demonstration, Tactile cues, Verbal cues, and Handouts Education comprehension: verbalized understanding, returned demonstration, verbal cues required, tactile cues required, and needs further education  HOME EXERCISE PROGRAM: Access Code: [email or web address] URL: [email or web address] Date: 12/02/2022 Prepared by: Gillermo Murdoch  Exercises - Supine Cervical Retraction with Towel  - 2 x daily - 7 x weekly - 1 sets - 5-10 reps - 10 sec  hold - Seated Cervical Retraction  - 2 x daily - 7 x weekly - 1-2 sets - 5-10  reps - 10 sec  hold - Supine Scapular Retraction  - 2 x daily - 7 x weekly - 1 sets - 10 reps - 5-10 sec  hold - Seated Scapular Retraction  - 2 x daily - 7 x weekly - 1-2 sets - 10 reps - 10 sec  hold - Seated Cervical Sidebending AROM  - 2 x daily - 7 x weekly - 1 sets - 5 reps - 5-10 sec  hold  ASSESSMENT:  CLINICAL IMPRESSION:  Patient reports increased pain and discomfort in the arm since taking the brace off yesterday. He has less soreness in the Rt neck.  Continued PROM Rt elbow extension, supination and pronation within protocol limits. Working with pt in sitting ans supine including Isometric scapular retraction with shoulder extension tolerated well.  AAROM and isometric exercise Rt shoulder tolerated well.       OBJECTIVE IMPAIRMENTS: Patient is a 57 y.o. male who was seen today for physical therapy evaluation and treatment s/p Rt distal biceps tendon repair 11/20/22. Injury to Rt elbow reported 10/16/22. Decreased mobility, decreased ROM, decreased strength, hypomobility, increased edema, increased fascial restrictions, increased muscle spasms, impaired flexibility, impaired UE functional use, improper body mechanics, postural dysfunction, and pain.   GOALS: Goals reviewed with patient? Yes  SHORT TERM GOALS: Target date: 01/27/2023  Increase ROM Rt elbow flexion/extension to full range Baseline: Goal status: INITIAL  2.  Begin active exercise Rt UE per protocol  Baseline:  Goal status: INITIAL   LONG TERM GOALS: Target date: 02/24/2023   Full pain free ROM Rt shoulder and elbow Baseline:  Goal status: INITIAL  2.  Increase strength Rt UE allowing patient to return to normal functional activities including preparation for RTW  Baseline:  Goal status: INITIAL  3.  Return to bowling as MD approves  Baseline:  Goal status: INITIAL  4.  Patient demonstrates improved posture and alignment with posterior shoulder girdle engaged  Baseline:  Goal status: INITIAL  5.  Independent in HEP including aquatic program as indicated  Baseline:  Goal status: INITIAL  6.  Improve functional limitation score to 51  Baseline: 4 Goal status: INITIAL  PLAN:  PT FREQUENCY: 2x/week  PT DURATION: 12 weeks  PLANNED INTERVENTIONS: Therapeutic exercises, Therapeutic activity, Neuromuscular re-education, Patient/Family education, Self Care, Joint mobilization, Aquatic Therapy, Dry Needling, Electrical stimulation, Spinal mobilization, Cryotherapy, Moist heat, Taping, Vasopneumatic device, Ultrasound, Ionotophoresis '4mg'$ /ml Dexamethasone, Manual therapy, and Re-evaluation  PLAN FOR NEXT SESSION: review and progress exercises; manual therapy and  PROM Rt UE per protocol; manual work; DN; modalities as indicated   [employer], PT 12/25/2022, 10:55 AM

## 2023-01-01 ENCOUNTER — Encounter: Payer: Self-pay | Admitting: Rehabilitative and Restorative Service Providers"

## 2023-01-01 ENCOUNTER — Ambulatory Visit: Payer: 59 | Admitting: Rehabilitative and Restorative Service Providers"

## 2023-01-01 DIAGNOSIS — R293 Abnormal posture: Secondary | ICD-10-CM

## 2023-01-01 DIAGNOSIS — M6281 Muscle weakness (generalized): Secondary | ICD-10-CM

## 2023-01-01 DIAGNOSIS — R29898 Other symptoms and signs involving the musculoskeletal system: Secondary | ICD-10-CM

## 2023-01-01 DIAGNOSIS — S46219A Strain of muscle, fascia and tendon of other parts of biceps, unspecified arm, initial encounter: Secondary | ICD-10-CM | POA: Diagnosis not present

## 2023-01-01 NOTE — Therapy (Signed)
OUTPATIENT PHYSICAL THERAPY SHOULDER TREATMENT   Patient Name: Darren Mcdaniel MRN: [id number] DOB:07/14/1966, 57 y.o., male Today's Date: 01/01/2023  END OF SESSION:  PT End of Session - 01/01/23 1102     Visit Number 7    Number of Visits 24    Date for PT Re-Evaluation 02/24/23    Authorization - Visit Number 7    Authorization - Number of Visits 30    PT Start Time 1100    PT Stop Time 1140    PT Time Calculation (min) 40 min    Activity Tolerance Patient tolerated treatment well             Past Medical History:  Diagnosis Date   Arthritis    COVID-19 12/2019   Past Surgical History:  Procedure Laterality Date   FRACTURE SURGERY     left arm '84   Patient Active Problem List   Diagnosis Date Noted   Injury of right elbow 10/31/2022   Tophaceous gout, 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   Swelling of first metatarsophalangeal (MTP) joint of left foot 12/28/2019   Viral syndrome 11/08/2019   Triceps tendinitis 12/12/2015   Calcific tendinitis of left Quadriceps insertion 03/27/2015   Primary osteoarthritis of right hand 03/27/2015   Hyperlipidemia 03/16/2015   Dupuytren's contracture of right hand 03/10/2015   Flexor carpi radialis tenosynovitis, right 03/10/2015   Annual physical exam 03/10/2015   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    PCP: Dr Aundria Mems  REFERRING PROVIDER: Dr Ophelia Charter  REFERRING DIAG: Rt distal biceps repair   THERAPY DIAG:  Biceps tendon tear  Other symptoms and signs involving the musculoskeletal system  Abnormal posture  Muscle weakness (generalized)  Rationale for Evaluation and Treatment: Rehabilitation  ONSET DATE: injury 10/15/22; surgery 11/20/22  SUBJECTIVE:                                                                                                                                                                                      SUBJECTIVE STATEMENT:  Saw the doctor yesterday and he is out of the brace. He has  continued lifting restrictions of no more than 2# with no resistive exercise elbow flexion. No longer having problems with gout in the Rt elbow. Having some persistent swelling in Rt hand but decreasing.   PERTINENT HISTORY: Patient reports that he injured Rt biceps 10/16/22 while bowling. He had some pain prior to the bowling but then significant increase in pain when bowling. He underwent surgical repair of Rt distal biceps. He has had continued pain since surgery. Fx Lt arm with ORIF ~40 yrs ago; Lt knee tendonitis  PAIN:  Are you having pain?  Yes: NPRS scale: 0/10 with movement; 0/10 when at rest  Pain location: in inside of elbow  Pain description: aching tired  Aggravating factors: movement  Relieving factors: not moving   PRECAUTIONS: Other: see protocol - 1-6 weeks - no weight bearing > 2 pounds; hinged elbow brace unlocked to 30 deg extension; flexion PROM only; extension AROM/gentle PROM to tension free endpoint - increase 10 degrees per visit or position of comfort; supination/pronation at 90 degrees elbow flexion PROM only   WEIGHT BEARING RESTRICTIONS: Yes no weight bearing Rt UE   FALLS:  Has patient fallen in last 6 months? 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 Plans to return to this type work.  Yard work, Environmental consultant 1 x/wk; gun range 6 times/year   PATIENT GOALS: get arm strong and return to work and bowling   NEXT MD VISIT: 12/24/22  OBJECTIVE:   DIAGNOSTIC FINDINGS:  MRI 11/10/22 Rt elbow - 1. High-grade near-full thickness nonretracted tear of the distal biceps tendon just proximal to the insertion at the radial tuberosity. Small to moderate volume bicipitoradialis bursitis. 2. Severe tendinosis of the distal triceps tendon without  tear. 3. Mild tendinosis at the origin of the common extensor tendons without tear. 4. Advanced fatty atrophy of the flexor carpi radialis muscle, which could reflect previous trauma or sequela of chronic denervation. 5. Early mild osteoarthritic changes of the elbow joint. No effusion.  PATIENT SURVEYS:  FOTO 4; Goal 51  POSTURE: Patient presents with head forward posture with increased thoracic kyphosis; shoulders rounded and elevated; scapulae abducted and rotated along the thoracic spine; head of the humerus anterior in orientation.   UPPER EXTREMITY ROM: Rt shoulder elevation limited end ranges in brace; Rt elbow limited at 30 degrees in bledso hinged brace locked at 30 deg extension   Active ROM Right eval Right  12/18/22 Left eval  Shoulder flexion     Shoulder extension     Shoulder abduction     Shoulder adduction     Shoulder internal rotation     Shoulder external rotation     Elbow flexion 90 100 WNL's  Elbow extension -30 -10 WNL's   Wrist flexion     Wrist extension     Wrist ulnar deviation     Wrist radial deviation     Wrist pronation neutral 65   Wrist supination neutral  60   (Blank rows = not tested)  UPPER EXTREMITY MMT: strength not assessed resistively   MMT Right eval Left eval  Shoulder flexion    Shoulder extension    Shoulder abduction    Shoulder adduction    Shoulder internal rotation    Shoulder external rotation    Middle trapezius    Lower trapezius    Elbow flexion    Elbow extension    Wrist flexion    Wrist extension    Wrist ulnar deviation    Wrist radial deviation    Wrist pronation    Wrist supination    Grip strength (lbs)    (Blank rows = not tested)  PALPATION:  Muscular tightness Rt upper trap; leveator; pecs; proximal anterior shoulder   [facility] Adult PT  Treatment:  DATE: 01/01/23 Therapeutic Exercise: Scap squeeze sitting 5 sec x 5 Chin tuck sitting 5 sec x  5 Lateral cervical flexion/upper trap stretch 5 sec x 3  Lateral cervical flexion to Rt w/axial extension;  Lt w/chin tuck and slight Lt rotation 5 sec x 5  Assisted Rt shoulder abduction PT supporting LE x 5 with scap squeeze  Shoulder abduction isometric 3 sec x 10  Assisted shoulder ER/IR PT supporting Rt UE, elbow ~ 90 deg flexion x 10  Shoulder ext isometric 5 sec x 10 PT assist Isometric supination/pronation elbow 90 deg flexion  3 sec x 10   Shoulder isometrics abduction, extension, adduction; ER elbow at 90 deg 3 sec x 10 each HEP - LE's - wall squat; step up 6" step; walking  Manual Therapy: STM Rt upper trap into scapular area Rt shoulder  PROM Rt elbow in supination/pronation elbow at 90 degrees flexion to tissue limits and pt tolerance  PROM Rt elbow flexion and extension ~ 90 deg flexion to -10 deg extension  Neuromuscular re-ed: Postural correction encouraging patient to engage posterior shoulder girdle and avoid sitting with Rt shoulder depressed    Treatment:                                                DATE: 12/25/22 Therapeutic Exercise: Scap squeeze sitting 5 sec x 5 Chin tuck sitting 5 sec x 5 Lateral cervical flexion/upper trap stretch 5 sec x 3  Lateral cervical flexion to Rt w/axial extension;  Lt w/chin tuck and slight Lt rotation 5 sec x 5  Assisted Rt shoulder abduction PT supporting LE x 5 with scap squeeze  Shoulder abduction isometric 3 sec x 10  Assisted shoulder ER/IR PT supporting Rt UE, elbow ~ 90 deg flexion x 10  Shoulder ext isometric 5 sec x 10 PT assist Isometric supination/pronation elbow 90 deg flexion  3 sec x 10   HEP - LE's - wall squat; step up 6" step; walking  Manual Therapy: STM Rt upper trap into scapular area Rt shoulder  PROM Rt elbow in supination/pronation elbow at 90 degrees flexion to tissue limits and pt tolerance  PROM Rt elbow flexion and extension ~ 90 deg flexion to -10 deg extension  Neuromuscular re-ed: Postural  correction encouraging patient to engage posterior shoulder girdle and avoid sitting with Rt shoulder depressed  Therapeutic Activity: HEP - Retrograde massage fingers to wrist to address edema Rt hand.  Self Care: Suggested pt wean from brace and continue wearing brace at night for a few days  Avoid sitting in recliner or modify recliner to avoid rounded posture   PATIENT EDUCATION: Education details: POC; HEP Person educated: Patient Education method: Explanation, Demonstration, Tactile cues, Verbal cues, and Handouts Education comprehension: verbalized understanding, returned demonstration, verbal cues required, tactile cues required, and needs further education  HOME EXERCISE PROGRAM:  Access Code: [email or web address] URL: [email or web address] Date: 01/01/2023 Prepared by: Gillermo Murdoch  Exercises - Supine Cervical Retraction with Towel  - 2 x daily - 7 x weekly - 1 sets - 5-10 reps - 10 sec  hold - Seated Cervical Retraction  - 2 x daily - 7 x weekly - 1-2 sets - 5-10 reps - 10 sec  hold - Supine Scapular Retraction  - 2 x daily - 7 x weekly - 1 sets - 10 reps - 5-10 sec  hold - Seated Scapular Retraction  - 2 x daily - 7 x weekly - 1-2 sets - 10 reps - 10 sec  hold - Seated Cervical Sidebending AROM  - 2 x daily - 7 x weekly - 1 sets - 5 reps - 5-10 sec  hold - Standing Isometric Shoulder Extension with Doorway - Arm Bent  - 1 x daily - 7 x weekly - 1 sets - 5-10 reps - 5 sec  hold - Standing Isometric Shoulder Abduction with Doorway - Arm Bent  - 1 x daily - 7 x weekly - 1 sets - 5 reps - 5 sec  hold - Standing Isometric Shoulder Adduction with Ball  - 1 x daily - 7 x weekly - 1 sets - 10 reps - 3 sec  hold - Isometric Shoulder External Rotation at Wall  - 1 x daily - 7 x weekly - 1 sets - 10 reps - 3 sec  hold  ASSESSMENT:  CLINICAL IMPRESSION:  Patient reports increased discomfort in the arm following last treatment and even had some soreness into the Lt shoulder - lasted  about a day. He has less soreness in the Rt neck.  Continued PROM Rt elbow extension, supination and pronation within protocol limits. Working with pt in standing Isometric shoulder extension; abduction; adduction; ER 3 sec x 10 with some soreness and fatigue reported. P/AAROM and isometric exercise Rt shoulder tolerated well.       OBJECTIVE IMPAIRMENTS: Patient is a 57 y.o. male who was seen today for physical therapy evaluation and treatment s/p Rt distal biceps tendon repair 11/20/22. Injury to Rt elbow reported 10/16/22. Decreased mobility, decreased ROM, decreased strength, hypomobility, increased edema, increased fascial restrictions, increased muscle spasms, impaired flexibility, impaired UE functional use, improper body mechanics, postural dysfunction, and pain.   GOALS: Goals reviewed with patient? Yes  SHORT TERM GOALS: Target date: 01/27/2023  Increase ROM Rt elbow flexion/extension to full range Baseline: Goal status: INITIAL  2.  Begin active exercise Rt UE per protocol  Baseline:  Goal status: INITIAL   LONG TERM GOALS: Target date: 02/24/2023   Full pain free ROM Rt shoulder and elbow Baseline:  Goal status: INITIAL  2.  Increase strength Rt UE allowing patient to return to normal functional activities including preparation for RTW  Baseline:  Goal status: INITIAL  3.  Return to bowling as MD approves  Baseline:  Goal status: INITIAL  4.  Patient demonstrates improved posture and alignment with posterior shoulder girdle engaged  Baseline:  Goal status: INITIAL  5.  Independent in HEP including aquatic program as indicated  Baseline:  Goal status: INITIAL  6.  Improve functional limitation score to 51  Baseline: 4 Goal status: INITIAL  PLAN:  PT FREQUENCY: 2x/week  PT DURATION: 12 weeks  PLANNED INTERVENTIONS: Therapeutic exercises, Therapeutic activity, Neuromuscular re-education, Patient/Family education, Self Care, Joint mobilization, Aquatic Therapy,  Dry Needling, Electrical stimulation, Spinal mobilization, Cryotherapy, Moist heat, Taping, Vasopneumatic device, Ultrasound, Ionotophoresis 4mg /ml Dexamethasone, Manual therapy, and Re-evaluation  PLAN FOR NEXT SESSION: review and progress exercises; manual therapy and PROM Rt UE per protocol; manual work; DN; modalities as indicated   [employer], PT 01/01/2023, 11:03 AM

## 2023-01-08 ENCOUNTER — Encounter: Payer: Self-pay | Admitting: Rehabilitative and Restorative Service Providers"

## 2023-01-08 ENCOUNTER — Ambulatory Visit: Payer: 59 | Admitting: Rehabilitative and Restorative Service Providers"

## 2023-01-08 DIAGNOSIS — M6281 Muscle weakness (generalized): Secondary | ICD-10-CM

## 2023-01-08 DIAGNOSIS — S46219A Strain of muscle, fascia and tendon of other parts of biceps, unspecified arm, initial encounter: Secondary | ICD-10-CM

## 2023-01-08 DIAGNOSIS — R293 Abnormal posture: Secondary | ICD-10-CM

## 2023-01-08 DIAGNOSIS — R29898 Other symptoms and signs involving the musculoskeletal system: Secondary | ICD-10-CM

## 2023-01-08 NOTE — Therapy (Signed)
OUTPATIENT PHYSICAL THERAPY SHOULDER TREATMENT   Patient Name: Darren Mcdaniel MRN: [id number] DOB:03-28-1966, 57 y.o., male Today's Date: 01/08/2023  END OF SESSION:  PT End of Session - 01/08/23 1138     Visit Number 8    Number of Visits 24    Date for PT Re-Evaluation 02/24/23    Authorization - Visit Number 8    Authorization - Number of Visits 30    PT Start Time 1100    PT Stop Time 1138    PT Time Calculation (min) 38 min    Activity Tolerance Patient tolerated treatment well             Past Medical History:  Diagnosis Date   Arthritis    COVID-19 12/2019   Past Surgical History:  Procedure Laterality Date   FRACTURE SURGERY     left arm '84   Patient Active Problem List   Diagnosis Date Noted   Injury of right elbow 10/31/2022   Tophaceous gout, 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   Swelling of first metatarsophalangeal (MTP) joint of left foot 12/28/2019   Viral syndrome 11/08/2019   Triceps tendinitis 12/12/2015   Calcific tendinitis of left Quadriceps insertion 03/27/2015   Primary osteoarthritis of right hand 03/27/2015   Hyperlipidemia 03/16/2015   Dupuytren's contracture of right hand 03/10/2015   Flexor carpi radialis tenosynovitis, right 03/10/2015   Annual physical exam 03/10/2015   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    PCP: Dr Aundria Mems  REFERRING PROVIDER: Dr Ophelia Charter  REFERRING DIAG: Rt distal biceps repair   THERAPY DIAG:  Biceps tendon tear  Other symptoms and signs involving the musculoskeletal system  Abnormal posture  Muscle weakness (generalized)  Rationale for Evaluation and Treatment: Rehabilitation  ONSET DATE: injury 10/15/22; surgery 11/20/22  SUBJECTIVE:                                                                                                                                                                                      SUBJECTIVE STATEMENT:  Patient reports increased soreness and some pain in the Rt  UE in the shoulder, biceps and forearm which has been present for the past week. Can tell it hurts when he uses it for anything. Hard not to use the Rt UE since he is Rt handed. He has continued lifting restrictions of no more than 2# with no resistive exercise elbow flexion. Having minimal persistent swelling in Rt hand.   PERTINENT HISTORY: Patient reports that he injured Rt biceps 10/16/22 while bowling. He had some pain prior to the bowling but then significant increase in pain when bowling. He underwent surgical repair of Rt distal biceps. He  has had continued pain since surgery. Fx Lt arm with ORIF ~40 yrs ago; Lt knee tendonitis  PAIN:  Are you having pain? Yes: NPRS scale: 3/10 with movement; 0/10 when at rest  Pain location: in inside of elbow  Pain description: aching tired  Aggravating factors: movement  Relieving factors: not moving   PRECAUTIONS: Other: see protocol - 6 weeks - no weight bearing or lifting > 2 pounds; out of elbow brace; active elbow flexion gravity only - no resistive work;  PROM goal full tension free elbow extension at 9 weeks (01/22/23)   WEIGHT BEARING RESTRICTIONS: Yes no weight bearing Rt UE   FALLS:  Has patient fallen in last 6 months? 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 Plans to return to this type work.  Yard work, Environmental consultant 1 x/wk; gun range 6 times/year   PATIENT GOALS: get arm strong and return to work and bowling   NEXT MD VISIT: 12/24/22  OBJECTIVE:   DIAGNOSTIC FINDINGS:  MRI 11/10/22 Rt elbow - 1. High-grade near-full thickness nonretracted tear of the distal biceps tendon just proximal to the insertion at the radial tuberosity. Small to moderate volume bicipitoradialis bursitis. 2. Severe tendinosis of  the distal triceps tendon without tear. 3. Mild tendinosis at the origin of the common extensor tendons without tear. 4. Advanced fatty atrophy of the flexor carpi radialis muscle, which could reflect previous trauma or sequela of chronic denervation. 5. Early mild osteoarthritic changes of the elbow joint. No effusion.  PATIENT SURVEYS:  FOTO 4; Goal 51  POSTURE: Patient presents with head forward posture with increased thoracic kyphosis; shoulders rounded and elevated; scapulae abducted and rotated along the thoracic spine; head of the humerus anterior in orientation.   UPPER EXTREMITY ROM: Rt shoulder elevation limited end ranges in brace; Rt elbow limited at 30 degrees in bledso hinged brace locked at 30 deg extension   Active ROM Right eval Right  12/18/22 Right  01/08/23 Left eval  Shoulder flexion      Shoulder extension      Shoulder abduction      Shoulder adduction      Shoulder internal rotation      Shoulder external rotation      Elbow flexion 90 100 122 WNL's  Elbow extension -30 -10 0 WNL's   Wrist flexion      Wrist extension      Wrist ulnar deviation      Wrist radial deviation      Wrist pronation neutral 65    Wrist supination neutral  60    (Blank rows = not tested)  UPPER EXTREMITY MMT: strength not assessed resistively   MMT Right eval Left eval  Shoulder flexion    Shoulder extension    Shoulder abduction    Shoulder adduction    Shoulder internal rotation    Shoulder external rotation    Middle trapezius    Lower trapezius    Elbow flexion    Elbow extension    Wrist flexion    Wrist extension    Wrist ulnar deviation    Wrist radial deviation    Wrist pronation    Wrist supination    Grip strength (lbs)    (Blank rows =  not tested)  PALPATION:  Muscular tightness Rt upper trap; leveator; pecs; proximal anterior shoulder   TODAY'S [facility]  Treatment:                                                DATE: 01/08/23 Therapeutic  Exercise: Scap squeeze sitting 5 sec x 5 Chin tuck sitting 5 sec x 5 Lateral cervical flexion/upper trap stretch 5 sec x 3  Lateral cervical flexion to Rt w/axial extension;  Lt w/chin tuck and slight Lt rotation 5 sec x 5  Assisted Rt shoulder abduction PT supporting LE x 5 with scap squeeze   Shoulder ext isometric 5 sec x 10 PT assist AAROM supination/pronation elbow 90 deg flexion  3 sec x 10   Shoulder isometrics abduction, extension, adduction; ER elbow at 90 deg 3 sec x 10 each(hold) HEP - LE's - wall squat; step up 6" step; walking  Manual Therapy: STM Rt upper trap into scapular area Rt shoulder  PROM Rt elbow in supination/pronation elbow at 90 degrees flexion to tissue limits and pt tolerance  PROM Rt elbow flexion and extension ~ 90 deg flexion to -10 deg extension  Neuromuscular re-ed: Postural correction encouraging patient to engage posterior shoulder girdle and avoid sitting with Rt shoulder depressed  Treatment:                                                DATE: 01/01/23 Therapeutic Exercise: Scap squeeze sitting 5 sec x 5 Chin tuck sitting 5 sec x 5 Lateral cervical flexion/upper trap stretch 5 sec x 3  Lateral cervical flexion to Rt w/axial extension;  Lt w/chin tuck and slight Lt rotation 5 sec x 5  Assisted Rt shoulder abduction PT supporting LE x 5 with scap squeeze  Shoulder abduction isometric 3 sec x 10  Assisted shoulder ER/IR PT supporting Rt UE, elbow ~ 90 deg flexion x 10  Shoulder ext isometric 5 sec x 10 PT assist Isometric supination/pronation elbow 90 deg flexion  3 sec x 10   Shoulder isometrics abduction, extension, adduction; ER elbow at 90 deg 3 sec x 10 each HEP - LE's - wall squat; step up 6" step; walking  Manual Therapy: STM Rt upper trap into scapular area Rt shoulder  PROM Rt elbow in supination/pronation elbow at 90 degrees flexion to tissue limits and pt tolerance  PROM Rt elbow flexion and extension ~ 90 deg flexion to -10 deg extension   Neuromuscular re-ed: Postural correction encouraging patient to engage posterior shoulder girdle and avoid sitting with Rt shoulder depressed    Treatment:                                                DATE: 12/25/22 Therapeutic Exercise: Scap squeeze sitting 5 sec x 5 Chin tuck sitting 5 sec x 5 Lateral cervical flexion/upper trap stretch 5 sec x 3  Lateral cervical flexion to Rt w/axial extension;  Lt w/chin tuck and slight Lt rotation 5 sec x 5  Assisted Rt shoulder abduction PT supporting LE x 5 with scap squeeze  Shoulder abduction isometric 3 sec x 10  Assisted shoulder ER/IR PT supporting Rt UE, elbow ~ 90 deg flexion x 10  Shoulder ext isometric 5 sec x 10 PT assist Isometric supination/pronation elbow 90 deg flexion  3 sec x 10   HEP - LE's - wall squat; step up 6" step; walking  Manual Therapy: STM Rt upper trap into scapular area Rt shoulder  PROM Rt elbow in supination/pronation elbow at 90 degrees flexion to tissue limits and pt tolerance  PROM Rt elbow flexion and extension ~ 90 deg flexion to -10 deg extension  Neuromuscular re-ed: Postural correction encouraging patient to engage posterior shoulder girdle and avoid sitting with Rt shoulder depressed  Therapeutic Activity: HEP - Retrograde massage fingers to wrist to address edema Rt hand.  Self Care: Suggested pt wean from brace and continue wearing brace at night for a few days  Avoid sitting in recliner or modify recliner to avoid rounded posture   PATIENT EDUCATION: Education details: POC; HEP Person educated: Patient Education method: Explanation, Demonstration, Tactile cues, Verbal cues, and Handouts Education comprehension: verbalized understanding, returned demonstration, verbal cues required, tactile cues required, and needs further education  HOME EXERCISE PROGRAM:  Access Code: [email or web address] URL: [email or web address] Date: 01/01/2023 Prepared by: Gillermo Murdoch  Exercises - Supine Cervical  Retraction with Towel  - 2 x daily - 7 x weekly - 1 sets - 5-10 reps - 10 sec  hold - Seated Cervical Retraction  - 2 x daily - 7 x weekly - 1-2 sets - 5-10 reps - 10 sec  hold - Supine Scapular Retraction  - 2 x daily - 7 x weekly - 1 sets - 10 reps - 5-10 sec  hold - Seated Scapular Retraction  - 2 x daily - 7 x weekly - 1-2 sets - 10 reps - 10 sec  hold - Seated Cervical Sidebending AROM  - 2 x daily - 7 x weekly - 1 sets - 5 reps - 5-10 sec  hold - Standing Isometric Shoulder Extension with Doorway - Arm Bent  - 1 x daily - 7 x weekly - 1 sets - 5-10 reps - 5 sec  hold - Standing Isometric Shoulder Abduction with Doorway - Arm Bent  - 1 x daily - 7 x weekly - 1 sets - 5 reps - 5 sec  hold - Standing Isometric Shoulder Adduction with Ball  - 1 x daily - 7 x weekly - 1 sets - 10 reps - 3 sec  hold - Isometric Shoulder External Rotation at Wall  - 1 x daily - 7 x weekly - 1 sets - 10 reps - 3 sec  hold  ASSESSMENT:  CLINICAL IMPRESSION:  Patient reports increased discomfort and some pain in the arm in the past week. Will hold isometric exercises until soreness subsides. He has used the arm accidentally. He has soreness in the Rt neck, posterior shoulder, biceps, extensor forearm.  Continued PROM Rt elbow extension, supination and pronation within protocol limits.   OBJECTIVE IMPAIRMENTS: Patient is a 57 y.o. male who was seen today for physical therapy evaluation and treatment s/p Rt distal biceps tendon repair 11/20/22. Injury to Rt elbow reported 10/16/22. Decreased mobility, decreased ROM, decreased strength, hypomobility, increased edema, increased fascial restrictions, increased muscle spasms, impaired flexibility, impaired UE functional use, improper body mechanics, postural dysfunction, and pain.   GOALS: Goals reviewed with patient? Yes  SHORT TERM GOALS: Target date: 01/27/2023  Increase ROM Rt elbow flexion/extension to full range Baseline:  Goal status: INITIAL  2.  Begin active  exercise Rt UE per protocol  Baseline:  Goal status: INITIAL   LONG TERM GOALS: Target date: 02/24/2023   Full pain free ROM Rt shoulder and elbow Baseline:  Goal status: INITIAL  2.  Increase strength Rt UE allowing patient to return to normal functional activities including preparation for RTW  Baseline:  Goal status: INITIAL  3.  Return to bowling as MD approves  Baseline:  Goal status: INITIAL  4.  Patient demonstrates improved posture and alignment with posterior shoulder girdle engaged  Baseline:  Goal status: INITIAL  5.  Independent in HEP including aquatic program as indicated  Baseline:  Goal status: INITIAL  6.  Improve functional limitation score to 51  Baseline: 4 Goal status: INITIAL  PLAN:  PT FREQUENCY: 2x/week  PT DURATION: 12 weeks  PLANNED INTERVENTIONS: Therapeutic exercises, Therapeutic activity, Neuromuscular re-education, Patient/Family education, Self Care, Joint mobilization, Aquatic Therapy, Dry Needling, Electrical stimulation, Spinal mobilization, Cryotherapy, Moist heat, Taping, Vasopneumatic device, Ultrasound, Ionotophoresis 4mg /ml Dexamethasone, Manual therapy, and Re-evaluation  PLAN FOR NEXT SESSION: review and progress exercises; manual therapy and PROM Rt UE per protocol; manual work; DN; modalities as indicated   [employer], PT 01/08/2023, 11:38 AM

## 2023-01-15 ENCOUNTER — Encounter: Payer: Self-pay | Admitting: Rehabilitative and Restorative Service Providers"

## 2023-01-15 ENCOUNTER — Ambulatory Visit: Payer: 59 | Attending: Orthopaedic Surgery | Admitting: Rehabilitative and Restorative Service Providers"

## 2023-01-15 DIAGNOSIS — R29898 Other symptoms and signs involving the musculoskeletal system: Secondary | ICD-10-CM | POA: Insufficient documentation

## 2023-01-15 DIAGNOSIS — M6281 Muscle weakness (generalized): Secondary | ICD-10-CM | POA: Diagnosis present

## 2023-01-15 DIAGNOSIS — S46219A Strain of muscle, fascia and tendon of other parts of biceps, unspecified arm, initial encounter: Secondary | ICD-10-CM | POA: Insufficient documentation

## 2023-01-15 DIAGNOSIS — R293 Abnormal posture: Secondary | ICD-10-CM | POA: Insufficient documentation

## 2023-01-15 NOTE — Therapy (Signed)
OUTPATIENT PHYSICAL THERAPY SHOULDER TREATMENT   Patient Name: Darren Mcdaniel MRN: [id number] DOB:1966-01-28, 57 y.o., male Today's Date: 01/15/2023  END OF SESSION:  PT End of Session - 01/15/23 1102     Visit Number 9    Number of Visits 24    Date for PT Re-Evaluation 02/24/23    Authorization - Visit Number 9    Authorization - Number of Visits 30    PT Start Time 1101    PT Stop Time 1140    PT Time Calculation (min) 39 min    Activity Tolerance Patient tolerated treatment well             Past Medical History:  Diagnosis Date   Arthritis    COVID-19 12/2019   Past Surgical History:  Procedure Laterality Date   FRACTURE SURGERY     left arm '84   Patient Active Problem List   Diagnosis Date Noted   Injury of right elbow 10/31/2022   Tophaceous gout, 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   Swelling of first metatarsophalangeal (MTP) joint of left foot 12/28/2019   Viral syndrome 11/08/2019   Triceps tendinitis 12/12/2015   Calcific tendinitis of left Quadriceps insertion 03/27/2015   Primary osteoarthritis of right hand 03/27/2015   Hyperlipidemia 03/16/2015   Dupuytren's contracture of right hand 03/10/2015   Flexor carpi radialis tenosynovitis, right 03/10/2015   Annual physical exam 03/10/2015   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    PCP: Dr Aundria Mems  REFERRING PROVIDER: Dr Ophelia Charter  REFERRING DIAG: Rt distal biceps repair   THERAPY DIAG:  Biceps tendon tear  Other symptoms and signs involving the musculoskeletal system  Abnormal posture  Muscle weakness (generalized)  Rationale for Evaluation and Treatment: Rehabilitation  ONSET DATE: injury 10/15/22; surgery 11/20/22  SUBJECTIVE:                                                                                                                                                                                      SUBJECTIVE STATEMENT:  Patient reports some discomfort in Rt elbow from gout which  has improved with meds. Shoulder and elbow are doing well.  Rt UE in the shoulder, biceps and forearm are feeling better. Can tell it hurts when he uses it for anything. Hard not to use the Rt UE since he is Rt handed. He has continued lifting restrictions of no more than 5# with no resistive exercise elbow flexion until 10 weeks. Having minimal swelling in Rt hand.   PERTINENT HISTORY: Patient reports that he injured Rt biceps 10/16/22 while bowling. He had some pain prior to the bowling but then significant increase in pain when bowling.  He underwent surgical repair of Rt distal biceps. He has had continued pain since surgery. Fx Lt arm with ORIF ~40 yrs ago; Lt knee tendonitis  PAIN:  Are you having pain? Yes: NPRS scale: 1-3/10 with movement; 0/10 when at rest  Pain location: in inside of elbow gout  Pain description: aching tired  Aggravating factors: movement  Relieving factors: not moving   PRECAUTIONS: Other: see protocol - 6 weeks - no weight bearing or lifting > 2 pounds; out of elbow brace; active elbow flexion gravity only - no resistive work;  PROM goal full tension free elbow extension at 9 weeks (01/22/23)   WEIGHT BEARING RESTRICTIONS: Yes no weight bearing Rt UE   FALLS:  Has patient fallen in last 6 months? 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 Plans to return to this type work.  Yard work, Environmental consultant 1 x/wk; gun range 6 times/year   PATIENT GOALS: get arm strong and return to work and bowling   NEXT MD VISIT: 01/28/23  OBJECTIVE:   DIAGNOSTIC FINDINGS:  MRI 11/10/22 Rt elbow - 1. High-grade near-full thickness nonretracted tear of the distal biceps tendon just proximal to the insertion at the radial tuberosity. Small to moderate volume  bicipitoradialis bursitis. 2. Severe tendinosis of the distal triceps tendon without tear. 3. Mild tendinosis at the origin of the common extensor tendons without tear. 4. Advanced fatty atrophy of the flexor carpi radialis muscle, which could reflect previous trauma or sequela of chronic denervation. 5. Early mild osteoarthritic changes of the elbow joint. No effusion.  PATIENT SURVEYS:  FOTO 4; Goal 51  POSTURE: Patient presents with head forward posture with increased thoracic kyphosis; shoulders rounded and elevated; scapulae abducted and rotated along the thoracic spine; head of the humerus anterior in orientation.   UPPER EXTREMITY ROM: Rt shoulder elevation limited end ranges in brace; Rt elbow limited at 30 degrees in bledso hinged brace locked at 30 deg extension   Active ROM Right eval Right  12/18/22 Right  01/08/23 Left eval  Shoulder flexion      Shoulder extension      Shoulder abduction      Shoulder adduction      Shoulder internal rotation      Shoulder external rotation      Elbow flexion 90 100 122 WNL's  Elbow extension -30 -10 0 WNL's   Wrist flexion      Wrist extension      Wrist ulnar deviation      Wrist radial deviation      Wrist pronation neutral 65    Wrist supination neutral  60    (Blank rows = not tested)  UPPER EXTREMITY MMT: strength not assessed resistively   MMT Right eval Left eval  Shoulder flexion    Shoulder extension    Shoulder abduction    Shoulder adduction    Shoulder internal rotation    Shoulder external rotation    Middle trapezius    Lower trapezius    Elbow flexion    Elbow extension    Wrist flexion    Wrist extension    Wrist ulnar deviation    Wrist radial deviation    Wrist pronation    Wrist supination  Grip strength (lbs)    (Blank rows = not tested)  PALPATION:  Muscular tightness Rt upper trap; leveator; pecs; proximal anterior shoulder   TODAY'S [facility] Adult PT  Treatment:                                                 DATE: 01/14/23 Therapeutic Exercise: Scap squeeze sitting 5 sec x 5 Chin tuck sitting 5 sec x 5 Lateral cervical flexion/upper trap stretch 5 sec x 3  Lateral cervical flexion to Rt w/axial extension;  Lt w/chin tuck and slight Lt rotation 5 sec x 5  Assisted Rt shoulder abduction PT supporting LE x 5 with scap squeeze   Shoulder ext isometric 5 sec x 10 PT assist AAROM supination/pronation elbow 90 deg flexion  3 sec x 10   Shoulder isometrics abduction, extension, adduction; ER elbow at 90 deg 3 sec x 5 each HEP - LE's - wall squat; step up 6" step; walking  Manual Therapy: STM Rt upper trap into scapular area Rt shoulder  PROM Rt elbow in supination/pronation elbow at 90 degrees flexion to tissue limits and pt tolerance  PROM Rt elbow flexion and extension ~ 115 deg flexion to -5 deg extension  Neuromuscular re-ed: Postural correction encouraging patient to engage posterior shoulder girdle and avoid sitting with Rt shoulder depressed   Treatment:                                                DATE: 01/08/23 Therapeutic Exercise: Scap squeeze sitting 5 sec x 5 Chin tuck sitting 5 sec x 5 Lateral cervical flexion/upper trap stretch 5 sec x 3  Lateral cervical flexion to Rt w/axial extension;  Lt w/chin tuck and slight Lt rotation 5 sec x 5  Assisted Rt shoulder abduction PT supporting LE x 5 with scap squeeze   Shoulder ext isometric 5 sec x 10 PT assist AAROM supination/pronation elbow 90 deg flexion  3 sec x 10   Shoulder isometrics abduction, extension, adduction; ER elbow at 90 deg 3 sec x 10 each(hold) HEP - LE's - wall squat; step up 6" step; walking  Manual Therapy: STM Rt upper trap into scapular area Rt shoulder  PROM Rt elbow in supination/pronation elbow at 90 degrees flexion to tissue limits and pt tolerance  PROM Rt elbow flexion and extension ~ 90 deg flexion to -10 deg extension  Neuromuscular re-ed: Postural correction encouraging patient to  engage posterior shoulder girdle and avoid sitting with Rt shoulder depressed  PATIENT EDUCATION: Education details: POC; HEP Person educated: Patient Education method: Consulting civil engineer, Media planner, Corporate treasurer cues, Verbal cues, and Handouts Education comprehension: verbalized understanding, returned demonstration, verbal cues required, tactile cues required, and needs further education  HOME EXERCISE PROGRAM:  Access Code: [email or web address] URL: [email or web address] Date: 01/01/2023 Prepared by: Gillermo Murdoch  Exercises - Supine Cervical Retraction with Towel  - 2 x daily - 7 x weekly - 1 sets - 5-10 reps - 10 sec  hold - Seated Cervical Retraction  - 2 x daily - 7 x weekly - 1-2 sets - 5-10 reps - 10 sec  hold - Supine Scapular Retraction  - 2 x daily - 7 x weekly - 1 sets - 10 reps -  5-10 sec  hold - Seated Scapular Retraction  - 2 x daily - 7 x weekly - 1-2 sets - 10 reps - 10 sec  hold - Seated Cervical Sidebending AROM  - 2 x daily - 7 x weekly - 1 sets - 5 reps - 5-10 sec  hold - Standing Isometric Shoulder Extension with Doorway - Arm Bent  - 1 x daily - 7 x weekly - 1 sets - 5-10 reps - 5 sec  hold - Standing Isometric Shoulder Abduction with Doorway - Arm Bent  - 1 x daily - 7 x weekly - 1 sets - 5 reps - 5 sec  hold - Standing Isometric Shoulder Adduction with Ball  - 1 x daily - 7 x weekly - 1 sets - 10 reps - 3 sec  hold - Isometric Shoulder External Rotation at Wall  - 1 x daily - 7 x weekly - 1 sets - 10 reps - 3 sec  hold  ASSESSMENT:  CLINICAL IMPRESSION:  Patient reports increased discomfort and some pain in the elbow in the past week from gout flare up. Elbow feels OK otherwise. Will resume isometric exercises Rt shoulder.   He has minimal soreness in the Rt neck, posterior shoulder, biceps, extensor forearm.  Continued PROM Rt elbow extension, supination and pronation within protocol limits. No resistive elbow exercise until 10 weeks.   OBJECTIVE IMPAIRMENTS: Patient  is a 57 y.o. male who was seen today for physical therapy evaluation and treatment s/p Rt distal biceps tendon repair 11/20/22. Injury to Rt elbow reported 10/16/22. Decreased mobility, decreased ROM, decreased strength, hypomobility, increased edema, increased fascial restrictions, increased muscle spasms, impaired flexibility, impaired UE functional use, improper body mechanics, postural dysfunction, and pain.   GOALS: Goals reviewed with patient? Yes  SHORT TERM GOALS: Target date: 01/27/2023  Increase ROM Rt elbow flexion/extension to full range Baseline: Goal status: INITIAL  2.  Begin active exercise Rt UE per protocol  Baseline:  Goal status: INITIAL   LONG TERM GOALS: Target date: 02/24/2023   Full pain free ROM Rt shoulder and elbow Baseline:  Goal status: INITIAL  2.  Increase strength Rt UE allowing patient to return to normal functional activities including preparation for RTW  Baseline:  Goal status: INITIAL  3.  Return to bowling as MD approves  Baseline:  Goal status: INITIAL  4.  Patient demonstrates improved posture and alignment with posterior shoulder girdle engaged  Baseline:  Goal status: INITIAL  5.  Independent in HEP including aquatic program as indicated  Baseline:  Goal status: INITIAL  6.  Improve functional limitation score to 51  Baseline: 4 Goal status: INITIAL  PLAN:  PT FREQUENCY: 2x/week  PT DURATION: 12 weeks  PLANNED INTERVENTIONS: Therapeutic exercises, Therapeutic activity, Neuromuscular re-education, Patient/Family education, Self Care, Joint mobilization, Aquatic Therapy, Dry Needling, Electrical stimulation, Spinal mobilization, Cryotherapy, Moist heat, Taping, Vasopneumatic device, Ultrasound, Ionotophoresis 4mg /ml Dexamethasone, Manual therapy, and Re-evaluation  PLAN FOR NEXT SESSION: review and progress exercises; manual therapy and PROM Rt UE per protocol; manual work; DN; modalities as indicated   [employer],  PT 01/15/2023, 11:04 AM

## 2023-01-22 ENCOUNTER — Ambulatory Visit: Payer: 59 | Admitting: Rehabilitative and Restorative Service Providers"

## 2023-01-22 ENCOUNTER — Encounter: Payer: Self-pay | Admitting: Rehabilitative and Restorative Service Providers"

## 2023-01-22 DIAGNOSIS — S46219A Strain of muscle, fascia and tendon of other parts of biceps, unspecified arm, initial encounter: Secondary | ICD-10-CM | POA: Diagnosis not present

## 2023-01-22 DIAGNOSIS — R29898 Other symptoms and signs involving the musculoskeletal system: Secondary | ICD-10-CM

## 2023-01-22 DIAGNOSIS — R293 Abnormal posture: Secondary | ICD-10-CM

## 2023-01-22 DIAGNOSIS — M6281 Muscle weakness (generalized): Secondary | ICD-10-CM

## 2023-01-22 NOTE — Therapy (Signed)
OUTPATIENT PHYSICAL THERAPY SHOULDER TREATMENT   Patient Name: Darren Mcdaniel MRN: [id number] DOB:05/21/1966, 57 y.o., male Today's Date: 01/22/2023  END OF SESSION:  PT End of Session - 01/22/23 1102     Visit Number 10    Number of Visits 24    Date for PT Re-Evaluation 02/24/23    Authorization - Visit Number 10    Authorization - Number of Visits 30    PT Start Time 1101    PT Stop Time 1141    PT Time Calculation (min) 40 min    Activity Tolerance Patient tolerated treatment well             Past Medical History:  Diagnosis Date   Arthritis    COVID-19 12/2019   Past Surgical History:  Procedure Laterality Date   FRACTURE SURGERY     left arm '84   Patient Active Problem List   Diagnosis Date Noted   Injury of right elbow 10/31/2022   Tophaceous gout, 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   Swelling of first metatarsophalangeal (MTP) joint of left foot 12/28/2019   Viral syndrome 11/08/2019   Triceps tendinitis 12/12/2015   Calcific tendinitis of left Quadriceps insertion 03/27/2015   Primary osteoarthritis of right hand 03/27/2015   Hyperlipidemia 03/16/2015   Dupuytren's contracture of right hand 03/10/2015   Flexor carpi radialis tenosynovitis, right 03/10/2015   Annual physical exam 03/10/2015   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    PCP: Dr Rodney Langton  REFERRING PROVIDER: Dr Ramond Marrow  REFERRING DIAG: Rt distal biceps repair   THERAPY DIAG:  Biceps tendon tear  Other symptoms and signs involving the musculoskeletal system  Abnormal posture  Muscle weakness (generalized)  Rationale for Evaluation and Treatment: Rehabilitation  ONSET DATE: injury 10/15/22; surgery 11/20/22  SUBJECTIVE:                                                                                                                                                                                      SUBJECTIVE STATEMENT:  Patient reports minimal discomfort in Rt elbow - in biceps  and triceps. Shoulder and elbow ROM are doing well. Hard not to use the Rt UE since he is Rt handed. He has continued lifting restrictions of no more than 5# with no resistive exercise elbow flexion until 10 weeks.   PERTINENT HISTORY: Patient reports that he injured Rt biceps 10/16/22 while bowling. He had some pain prior to the bowling but then significant increase in pain when bowling. He underwent surgical repair of Rt distal biceps. He has had continued pain since surgery. Fx Lt arm with ORIF ~40 yrs ago; Lt knee tendonitis  PAIN:  Are you having pain? Yes: NPRS scale: 1/10 with movement; 0/10 when at rest  Pain location: in inside of elbow gout  Pain description: aching tired  Aggravating factors: movement  Relieving factors: not moving   PRECAUTIONS: Other: see protocol - 6 weeks - no weight bearing or lifting > 2 pounds; out of elbow brace; active elbow flexion gravity only - no resistive work;  PROM goal full tension free elbow extension at 9 weeks (01/22/23)   WEIGHT BEARING RESTRICTIONS: Yes no weight bearing Rt UE   FALLS:  Has patient fallen in last 6 months? 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 Plans to return to this type work.  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 High-grade near-full thickness nonretracted tear of the distal biceps tendon just proximal to the insertion at the radial tuberosity. Small to moderate volume bicipitoradialis bursitis. 2. Severe tendinosis of the distal triceps tendon without tear. 3. Mild tendinosis at the origin of the common extensor  tendons without tear. 4. Advanced fatty atrophy of the flexor carpi radialis muscle, which could reflect previous trauma or sequela of chronic denervation. 5. Early mild osteoarthritic changes of the elbow joint. No effusion.  PATIENT SURVEYS:  FOTO 4; Goal 51  POSTURE: Patient presents with head forward posture with increased thoracic kyphosis; shoulders rounded and elevated; scapulae abducted and rotated along the thoracic spine; head of the humerus anterior in orientation.   UPPER EXTREMITY ROM: Rt shoulder elevation limited end ranges in brace; Rt elbow limited at 30 degrees in bledso hinged brace locked at 30 deg extension   Active ROM Right eval Right  12/18/22 Right  01/08/23 Right  01/22/23 Left eval  Shoulder flexion       Shoulder extension       Shoulder abduction       Shoulder adduction       Shoulder internal rotation       Shoulder external rotation       Elbow flexion 90 100 122 141 WNL's  Elbow extension -30 -10 0 0 WNL's   Wrist flexion       Wrist extension       Wrist ulnar deviation       Wrist radial deviation       Wrist pronation neutral 65  85   Wrist supination neutral  60  82   (Blank rows = not tested)  UPPER EXTREMITY MMT: strength not assessed resistively   MMT Right eval Left eval  Shoulder flexion    Shoulder extension    Shoulder abduction    Shoulder adduction    Shoulder internal rotation    Shoulder external rotation    Middle trapezius    Lower trapezius    Elbow flexion    Elbow extension    Wrist flexion    Wrist extension    Wrist ulnar deviation    Wrist radial deviation    Wrist pronation    Wrist supination    Grip strength (lbs)    (Blank rows = not tested)  PALPATION:  Muscular tightness Rt upper trap; leveator; pecs; proximal anterior shoulder   TODAY'S [facility] Adult PT  Treatment:                                                DATE: 01/22/23 Therapeutic Exercise: Scap squeeze sitting 5 sec x 5 Chin tuck sitting  5 sec x 5 Active Shoulder ER standing 3 sec x 10  Assisted Rt shoulder abduction PT supporting LE x 5 with scap squeeze   Shoulder ext isometric 5 sec x 10 PT assist Isometric shoulder extension red TB 3 sec x 10  Isometric ER red TB 3 sec x 10  Isometric IR red TB 3 sec x 10  AAROM supination/pronation elbow 90 deg flexion  3 sec x 10   Shoulder isometrics abduction, extension, adduction; ER elbow at 90 deg 3 sec x 10 each HEP - LE's - wall squat; step up 6" step; walking  Manual Therapy: STM Rt upper trap into scapular area Rt shoulder  PROM Rt elbow in supination/pronation elbow at 90 degrees flexion to tissue limits and pt tolerance  PROM Rt elbow flexion and extension ~ 115 deg flexion to -5 deg extension  Neuromuscular re-ed: Postural correction encouraging patient to engage posterior shoulder girdle and avoid sitting with Rt shoulder depressed  Treatment:                                                DATE: 01/14/23 Therapeutic Exercise: Scap squeeze sitting 5 sec x 5 Chin tuck sitting 5 sec x 5 Lateral cervical flexion/upper trap stretch 5 sec x 3  Lateral cervical flexion to Rt w/axial extension;  Lt w/chin tuck and slight Lt rotation 5 sec x 5  Assisted Rt shoulder abduction PT supporting LE x 5 with scap squeeze   Shoulder ext isometric 5 sec x 10 PT assist AAROM supination/pronation elbow 90 deg flexion  3 sec x 10   Shoulder isometrics abduction, extension, adduction; ER elbow at 90 deg 3 sec x 5 each HEP - LE's - wall squat; step up 6" step; walking  Manual Therapy: STM Rt upper trap into scapular area Rt shoulder  PROM Rt elbow in supination/pronation elbow at 90 degrees flexion to tissue limits and pt tolerance  PROM Rt elbow flexion and extension ~ 115 deg flexion to -5 deg extension  Neuromuscular re-ed: Postural correction encouraging patient to engage posterior shoulder girdle and avoid sitting with Rt shoulder depressed   Treatment:                                                 DATE: 01/08/23 Therapeutic Exercise: Scap squeeze sitting 5 sec x 5 Chin tuck sitting 5 sec x 5 Lateral cervical flexion/upper trap stretch 5 sec x 3  Lateral cervical flexion to Rt w/axial extension;  Lt w/chin tuck and slight Lt rotation 5 sec x 5  Assisted Rt shoulder abduction PT supporting LE x 5 with scap squeeze   Shoulder ext isometric 5 sec x 10 PT assist AAROM supination/pronation elbow 90 deg flexion  3  sec x 10   Shoulder isometrics abduction, extension, adduction; ER elbow at 90 deg 3 sec x 10 each(hold) HEP - LE's - wall squat; step up 6" step; walking  Manual Therapy: STM Rt upper trap into scapular area Rt shoulder  PROM Rt elbow in supination/pronation elbow at 90 degrees flexion to tissue limits and pt tolerance  PROM Rt elbow flexion and extension ~ 90 deg flexion to -10 deg extension  Neuromuscular re-ed: Postural correction encouraging patient to engage posterior shoulder girdle and avoid sitting with Rt shoulder depressed  PATIENT EDUCATION: Education details: POC; HEP Person educated: Patient Education method: Programmer, multimedia, Facilities manager, Actor cues, Verbal cues, and Handouts Education comprehension: verbalized understanding, returned demonstration, verbal cues required, tactile cues required, and needs further education  HOME EXERCISE PROGRAM: Access Code: [email or web address] URL: [email or web address] Date: 01/22/2023 Prepared by: Corlis Leak  Exercises - Supine Cervical Retraction with Towel  - 2 x daily - 7 x weekly - 1 sets - 5-10 reps - 10 sec  hold - Seated Cervical Retraction  - 2 x daily - 7 x weekly - 1-2 sets - 5-10 reps - 10 sec  hold - Supine Scapular Retraction  - 2 x daily - 7 x weekly - 1 sets - 10 reps - 5-10 sec  hold - Seated Scapular Retraction  - 2 x daily - 7 x weekly - 1-2 sets - 10 reps - 10 sec  hold - Seated Cervical Sidebending AROM  - 2 x daily - 7 x weekly - 1 sets - 5 reps - 5-10 sec  hold - Standing Isometric  Shoulder Extension with Doorway - Arm Bent  - 1 x daily - 7 x weekly - 1 sets - 5-10 reps - 5 sec  hold - Standing Isometric Shoulder Abduction with Doorway - Arm Bent  - 1 x daily - 7 x weekly - 1 sets - 5 reps - 5 sec  hold - Standing Isometric Shoulder Adduction with Ball  - 1 x daily - 7 x weekly - 1 sets - 10 reps - 3 sec  hold - Isometric Shoulder External Rotation at Wall  - 1 x daily - 7 x weekly - 1 sets - 10 reps - 3 sec  hold - Standing Shoulder Row Reactive Isometric  - 2 x daily - 7 x weekly - 1 sets - 10 reps - 30-45 sec  hold - Shoulder External Rotation Reactive Isometrics  - 1 x daily - 7 x weekly - 1 sets - 5-10 reps - 10-30 sec  hold - Shoulder Internal Rotation Reactive Isometrics  - 2 x daily - 7 x weekly - 1 sets - 10 reps - 3-5 sec  hold  ASSESSMENT:  CLINICAL IMPRESSION:  Patient reports minimal discomfort in Rt elbow or shoulder. Working on isometric exercises Rt shoulder.  He has minimal soreness in the Rt neck, posterior shoulder, biceps, extensor forearm.  Excellent gains in elbow and forearm ROM. Continued PROM Rt elbow extension, supination and pronation within protocol limits. No resistive elbow exercise until 10 weeks.   OBJECTIVE IMPAIRMENTS: Patient is a 57 y.o. male who was seen today for physical therapy evaluation and treatment s/p Rt distal biceps tendon repair 11/20/22. Injury to Rt elbow reported 10/16/22. Decreased mobility, decreased ROM, decreased strength, hypomobility, increased edema, increased fascial restrictions, increased muscle spasms, impaired flexibility, impaired UE functional use, improper body mechanics, postural dysfunction, and pain.   GOALS: Goals reviewed with patient? Yes  SHORT TERM GOALS: Target date: 01/27/2023  Increase ROM Rt elbow flexion/extension to full range Baseline: Goal status: INITIAL  2.  Begin active exercise Rt UE per protocol  Baseline:  Goal status: INITIAL   LONG TERM GOALS: Target date: 02/24/2023   Full pain  free ROM Rt shoulder and elbow Baseline:  Goal status: INITIAL  2.  Increase strength Rt UE allowing patient to return to normal functional activities including preparation for RTW  Baseline:  Goal status: INITIAL  3.  Return to bowling as MD approves  Baseline:  Goal status: INITIAL  4.  Patient demonstrates improved posture and alignment with posterior shoulder girdle engaged  Baseline:  Goal status: INITIAL  5.  Independent in HEP including aquatic program as indicated  Baseline:  Goal status: INITIAL  6.  Improve functional limitation score to 51  Baseline: 4 Goal status: INITIAL  PLAN:  PT FREQUENCY: 2x/week  PT DURATION: 12 weeks  PLANNED INTERVENTIONS: Therapeutic exercises, Therapeutic activity, Neuromuscular re-education, Patient/Family education, Self Care, Joint mobilization, Aquatic Therapy, Dry Needling, Electrical stimulation, Spinal mobilization, Cryotherapy, Moist heat, Taping, Vasopneumatic device, Ultrasound, Ionotophoresis 4mg /ml Dexamethasone, Manual therapy, and Re-evaluation  PLAN FOR NEXT SESSION: review and progress exercises; manual therapy and PROM Rt UE per protocol; manual work; DN; modalities as indicated   W.W. Grainger IncCelyn P Maryln Eastham, PT 01/22/2023, 11:02 AM

## 2023-01-29 ENCOUNTER — Encounter: Payer: Self-pay | Admitting: Rehabilitative and Restorative Service Providers"

## 2023-01-29 ENCOUNTER — Ambulatory Visit: Payer: 59 | Admitting: Rehabilitative and Restorative Service Providers"

## 2023-01-29 DIAGNOSIS — R29898 Other symptoms and signs involving the musculoskeletal system: Secondary | ICD-10-CM

## 2023-01-29 DIAGNOSIS — M6281 Muscle weakness (generalized): Secondary | ICD-10-CM

## 2023-01-29 DIAGNOSIS — R293 Abnormal posture: Secondary | ICD-10-CM

## 2023-01-29 DIAGNOSIS — S46219A Strain of muscle, fascia and tendon of other parts of biceps, unspecified arm, initial encounter: Secondary | ICD-10-CM | POA: Diagnosis not present

## 2023-01-29 NOTE — Therapy (Signed)
OUTPATIENT PHYSICAL THERAPY SHOULDER TREATMENT   Patient Name: Darren Mcdaniel MRN: [id number] DOB:1966/04/29, 57 y.o., male Today's Date: 01/29/2023  END OF SESSION:  PT End of Session - 01/29/23 1103     Visit Number 1    Number of Visits 24    Date for PT Re-Evaluation 02/24/23    Authorization - Visit Number 11    Authorization - Number of Visits 30    PT Start Time 1101    PT Stop Time 1142    PT Time Calculation (min) 41 min    Activity Tolerance Patient tolerated treatment well             Past Medical History:  Diagnosis Date   Arthritis    COVID-19 12/2019   Past Surgical History:  Procedure Laterality Date   FRACTURE SURGERY     left arm '84   Patient Active Problem List   Diagnosis Date Noted   Injury of right elbow 10/31/2022   Tophaceous gout, 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   Swelling of first metatarsophalangeal (MTP) joint of left foot 12/28/2019   Viral syndrome 11/08/2019   Triceps tendinitis 12/12/2015   Calcific tendinitis of left Quadriceps insertion 03/27/2015   Primary osteoarthritis of right hand 03/27/2015   Hyperlipidemia 03/16/2015   Dupuytren's contracture of right hand 03/10/2015   Flexor carpi radialis tenosynovitis, right 03/10/2015   Annual physical exam 03/10/2015   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    PCP: Dr Rodney Langton  REFERRING PROVIDER: Dr Ramond Marrow  REFERRING DIAG: Rt distal biceps repair   THERAPY DIAG:  Biceps tendon tear  Other symptoms and signs involving the musculoskeletal system  Abnormal posture  Muscle weakness (generalized)  Rationale for Evaluation and Treatment: Rehabilitation  ONSET DATE: injury 10/15/22; surgery 11/20/22  SUBJECTIVE:                                                                                                                                                                                      SUBJECTIVE STATEMENT:  Saw surgeon yesterday and is cleared to progress with  strengthening. Patient reports minimal discomfort in Rt elbow - in biceps and triceps. Shoulder and elbow ROM are doing well. Now using Rt arm for more functional activities.    PERTINENT HISTORY: Patient reports that he injured Rt biceps 10/16/22 while bowling. He had some pain prior to the bowling but then significant increase in pain when bowling. He underwent surgical repair of Rt distal biceps. He has had continued pain since surgery. Fx Lt arm with ORIF ~40 yrs ago; Lt knee tendonitis  PAIN:  Are you having pain? Yes: NPRS scale: 1/10 with movement; 0/10 when  at rest  Pain location: inner and outer elbow   Pain description: aching tired  Aggravating factors: movement  Relieving factors: not moving   PRECAUTIONS: Other: see protocol - 6 weeks - no weight bearing or lifting > 2 pounds; out of elbow brace; active elbow flexion gravity only - no resistive work;  PROM goal full tension free elbow extension at 9 weeks (01/22/23)   WEIGHT BEARING RESTRICTIONS: Yes no weight bearing Rt UE   FALLS:  Has patient fallen in last 6 months? 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 Plans to return to this type work.  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 High-grade near-full thickness nonretracted tear of the distal biceps tendon just proximal to the insertion at the radial tuberosity. Small to moderate volume bicipitoradialis bursitis. 2. Severe tendinosis of the distal triceps tendon without tear. 3. Mild tendinosis at the origin of the common extensor tendons without tear. 4. Advanced fatty  atrophy of the flexor carpi radialis muscle, which could reflect previous trauma or sequela of chronic denervation. 5. Early mild osteoarthritic changes of the elbow joint. No effusion.  PATIENT SURVEYS:  FOTO 4; Goal 51  POSTURE: Patient presents with head forward posture with increased thoracic kyphosis; shoulders rounded and elevated; scapulae abducted and rotated along the thoracic spine; head of the humerus anterior in orientation.   UPPER EXTREMITY ROM: Rt shoulder elevation limited end ranges in brace; Rt elbow limited at 30 degrees in bledso hinged brace locked at 30 deg extension   Active ROM Right eval Right  12/18/22 Right  01/08/23 Right  01/22/23 Left eval  Shoulder flexion       Shoulder extension       Shoulder abduction       Shoulder adduction       Shoulder internal rotation       Shoulder external rotation       Elbow flexion 90 100 122 141 WNL's  Elbow extension -30 -10 0 0 WNL's   Wrist flexion       Wrist extension       Wrist ulnar deviation       Wrist radial deviation       Wrist pronation neutral 65  85   Wrist supination neutral  60  82   (Blank rows = not tested)  UPPER EXTREMITY MMT: strength not assessed resistively   MMT Right eval Left eval  Shoulder flexion    Shoulder extension    Shoulder abduction    Shoulder adduction    Shoulder internal rotation    Shoulder external rotation    Middle trapezius    Lower trapezius    Elbow flexion    Elbow extension    Wrist flexion    Wrist extension    Wrist ulnar deviation    Wrist radial deviation    Wrist pronation    Wrist supination    Grip strength (lbs)    (Blank rows = not tested)  PALPATION:  Muscular tightness Rt upper trap; leveator; pecs; proximal anterior  shoulder   [facility]  Treatment:                                                DATE: 01/29/23 Therapeutic Exercise: Row green TB 3 sec x 10 x 2  Shoulder extension red TB 3 sec x 10  ER red TB 3 sec x 10 x  2 IR red TB 3 sec x 10 x 2 Biceps curl 2# 10 x 2  Hammer curl 2 # 10 x 2  Supination/pronation elbow 90 deg flexion 2# 10 x 2   Wall push up 10 x 2  AAROM Rt shoulder in supine 10 sec x 3 T in supine noodle along spine ~ 2 min UE's at ~ 40 deg abduction   Manual Therapy: STM Rt upper trap into scapular area Rt shoulder  PROM Rt elbow in supination/pronation elbow at 90 degrees flexion to tissue limits and pt tolerance  PROM Rt elbow flexion and extension ~ 115 deg flexion to -5 deg extension  Neuromuscular re-ed: Postural correction encouraging patient to engage posterior shoulder girdle and avoid sitting with Rt shoulder depressed  [facility] Adult [facility]  Treatment:                                                DATE: 01/22/23 Therapeutic Exercise: Scap squeeze sitting 5 sec x 5 Chin tuck sitting 5 sec x 5 Active Shoulder ER standing 3 sec x 10  Assisted Rt shoulder abduction PT supporting LE x 5 with scap squeeze   Shoulder ext isometric 5 sec x 10 PT assist Isometric shoulder extension red TB 3 sec x 10  Isometric ER red TB 3 sec x 10  Isometric IR red TB 3 sec x 10  AAROM supination/pronation elbow 90 deg flexion  3 sec x 10   Shoulder isometrics abduction, extension, adduction; ER elbow at 90 deg 3 sec x 10 each HEP - LE's - wall squat; step up 6" step; walking  Manual Therapy: STM Rt upper trap into scapular area Rt shoulder  PROM Rt elbow in supination/pronation elbow at 90 degrees flexion to tissue limits and pt tolerance  PROM Rt elbow flexion and extension ~ 115 deg flexion to -5 deg extension  Neuromuscular re-ed: Postural correction encouraging patient to engage posterior shoulder girdle and avoid sitting with Rt shoulder depressed   PATIENT EDUCATION: Education details: POC; HEP Person educated: Patient Education method: Programmer, multimedia, Facilities manager, Actor cues, Verbal cues, and Handouts Education comprehension: verbalized understanding, returned demonstration,  verbal cues required, tactile cues required, and needs further education  HOME EXERCISE PROGRAM: Access Code: [email or web address] URL: [email or web address] Date: 01/29/2023 Prepared by: Corlis Leak  Exercises - Supine Cervical Retraction with Towel  - 2 x daily - 7 x weekly - 1 sets - 5-10 reps - 10 sec  hold - Seated Cervical Retraction  - 2 x daily - 7 x weekly - 1-2 sets - 5-10 reps - 10 sec  hold - Supine Scapular Retraction  - 2 x daily - 7 x weekly - 1 sets - 10 reps - 5-10 sec  hold - Seated Scapular Retraction  - 2 x daily - 7 x weekly - 1-2 sets - 10  reps - 10 sec  hold - Seated Cervical Sidebending AROM  - 2 x daily - 7 x weekly - 1 sets - 5 reps - 5-10 sec  hold - Standing Isometric Shoulder Extension with Doorway - Arm Bent  - 1 x daily - 7 x weekly - 1 sets - 5-10 reps - 5 sec  hold - Standing Isometric Shoulder Abduction with Doorway - Arm Bent  - 1 x daily - 7 x weekly - 1 sets - 5 reps - 5 sec  hold - Standing Isometric Shoulder Adduction with Ball  - 1 x daily - 7 x weekly - 1 sets - 10 reps - 3 sec  hold - Isometric Shoulder External Rotation at Wall  - 1 x daily - 7 x weekly - 1 sets - 10 reps - 3 sec  hold - Standing Shoulder Row Reactive Isometric  - 2 x daily - 7 x weekly - 1 sets - 10 reps - 30-45 sec  hold - Shoulder External Rotation Reactive Isometrics  - 1 x daily - 7 x weekly - 1 sets - 5-10 reps - 10-30 sec  hold - Shoulder Internal Rotation Reactive Isometrics  - 2 x daily - 7 x weekly - 1 sets - 10 reps - 3-5 sec  hold - Standing Bilateral Low Shoulder Row with Anchored Resistance  - 1 x daily - 7 x weekly - 1-3 sets - 10 reps - 2-3 sec  hold - Shoulder Internal Rotation with Resistance  - 1 x daily - 7 x weekly - 2 sets - 10 reps - 3 sec  hold - Shoulder External Rotation with Anchored Resistance  - 1 x daily - 7 x weekly - 1-2 sets - 10 reps - 3 sec  hold - Wall Push Up  - 1 x daily - 7 x weekly - 1-3 sets - 10 reps - 3 sec  hold - Standing Pronated Elbow  Flexion with Dumbbell  - 1 x daily - 7 x weekly - 1-3 sets - 10 reps - 3 sec  hold - Standing Bicep Curls Neutral with Dumbbells  - 1 x daily - 7 x weekly - 1-3 sets - 10 reps - 3 sec  hold - Forearm Pronation and Supination with Hammer  - 1 x daily - 7 x weekly - 1-3 sets - 10 reps - 3 sec  hold - Supine Chest Stretch on Foam Roll  - 2 x daily - 7 x weekly - 1 sets - 1 reps - 2-5 min  sec  hold  ASSESSMENT:  CLINICAL IMPRESSION:  Patient reports MD released him to begin strengthening in preparation for RTW.  Minimal discomfort in Rt elbow or shoulder. Added resistive exercises in clinic and for home. Ask patient to bring job description from work so we can progress strengthening to prepare for RTW.  OBJECTIVE IMPAIRMENTS: Patient is a 57 y.o. male who was seen today for physical therapy evaluation and treatment s/p Rt distal biceps tendon repair 11/20/22. Injury to Rt elbow reported 10/16/22. Decreased mobility, decreased ROM, decreased strength, hypomobility, increased edema, increased fascial restrictions, increased muscle spasms, impaired flexibility, impaired UE functional use, improper body mechanics, postural dysfunction, and pain.   GOALS: Goals reviewed with patient? Yes  SHORT TERM GOALS: Target date: 01/27/2023  Increase ROM Rt elbow flexion/extension to full range Baseline: Goal status: INITIAL  2.  Begin active exercise Rt UE per protocol  Baseline:  Goal status: INITIAL   LONG TERM GOALS:  Target date: 02/24/2023   Full pain free ROM Rt shoulder and elbow Baseline:  Goal status: INITIAL  2.  Increase strength Rt UE allowing patient to return to normal functional activities including preparation for RTW  Baseline:  Goal status: INITIAL  3.  Return to bowling as MD approves  Baseline:  Goal status: INITIAL  4.  Patient demonstrates improved posture and alignment with posterior shoulder girdle engaged  Baseline:  Goal status: INITIAL  5.  Independent in HEP including  aquatic program as indicated  Baseline:  Goal status: INITIAL  6.  Improve functional limitation score to 51  Baseline: 4 Goal status: INITIAL  PLAN:  PT FREQUENCY: 2x/week  PT DURATION: 12 weeks  PLANNED INTERVENTIONS: Therapeutic exercises, Therapeutic activity, Neuromuscular re-education, Patient/Family education, Self Care, Joint mobilization, Aquatic Therapy, Dry Needling, Electrical stimulation, Spinal mobilization, Cryotherapy, Moist heat, Taping, Vasopneumatic device, Ultrasound, Ionotophoresis /ml Dexamethasone, Manual therapy, and Re-evaluation  PLAN FOR NEXT SESSION: review and progress exercises; manual therapy and PROM Rt UE per protocol; manual work; DN; modalities as indicated   [employer], PT 01/29/2023, 11:04 AM

## 2023-02-03 ENCOUNTER — Ambulatory Visit (INDEPENDENT_AMBULATORY_CARE_PROVIDER_SITE_OTHER): Payer: 59 | Admitting: Sports Medicine

## 2023-02-03 ENCOUNTER — Other Ambulatory Visit (INDEPENDENT_AMBULATORY_CARE_PROVIDER_SITE_OTHER): Payer: 59

## 2023-02-03 DIAGNOSIS — M19041 Primary osteoarthritis, right hand: Secondary | ICD-10-CM

## 2023-02-03 MED ORDER — TRIAMCINOLONE ACETONIDE 40 MG/ML IJ SUSP
40.0000 mg | Freq: Once | INTRAMUSCULAR | Status: AC
  Administered 2023-02-03: 40 mg via INTRAMUSCULAR

## 2023-02-03 NOTE — Progress Notes (Signed)
    Procedures performed today:    Procedure: Real-time Ultrasound Guided injection of the right third MCP Device: Samsung HS60  Verbal informed consent obtained.  Time-out conducted.  Noted no overlying erythema, induration, or other signs of local infection.  Skin prepped in a sterile fashion.  Local anesthesia: Topical Ethyl chloride.  With sterile technique and under real time ultrasound guidance: Noted synovitis and arthritic changes, 1/2 cc lidocaine, 1/2 cc Kenalog 40 injected easily. Completed without difficulty  Advised to call if fevers/chills, erythema, induration, drainage, or persistent bleeding.  Images permanently stored and available for review in PACS.  Impression: Technically successful ultrasound guided injection.  Independent interpretation of notes and tests performed by another provider:   None.  Brief History, Exam, Impression, and Recommendations:    Primary osteoarthritis of right hand Repeat right third MCP injection, last done November 2023.    ____________________________________________ Ihor Austin. Benjamin Stain, M.D., ABFM., CAQSM., AME. Primary Care and Sports Medicine [facility]  Adjunct Professor of Family Medicine  [employer]  Restaurant manager, fast food

## 2023-02-03 NOTE — Assessment & Plan Note (Signed)
Repeat right third MCP injection, last done November 2023.

## 2023-02-05 ENCOUNTER — Encounter: Payer: Self-pay | Admitting: Rehabilitative and Restorative Service Providers"

## 2023-02-05 ENCOUNTER — Ambulatory Visit: Payer: 59 | Admitting: Rehabilitative and Restorative Service Providers"

## 2023-02-05 DIAGNOSIS — S46219A Strain of muscle, fascia and tendon of other parts of biceps, unspecified arm, initial encounter: Secondary | ICD-10-CM | POA: Diagnosis not present

## 2023-02-05 DIAGNOSIS — R293 Abnormal posture: Secondary | ICD-10-CM

## 2023-02-05 DIAGNOSIS — R29898 Other symptoms and signs involving the musculoskeletal system: Secondary | ICD-10-CM

## 2023-02-05 DIAGNOSIS — M6281 Muscle weakness (generalized): Secondary | ICD-10-CM

## 2023-02-05 NOTE — Therapy (Signed)
OUTPATIENT PHYSICAL THERAPY SHOULDER TREATMENT   Patient Name: Darren Mcdaniel MRN: [id number] DOB:October 08, 1966, 57 y.o., male Today's Date: 02/05/2023  END OF SESSION:  PT End of Session - 02/05/23 1105     Visit Number 12    Number of Visits 24    Date for PT Re-Evaluation 02/24/23    Authorization - Visit Number 12    Authorization - Number of Visits 30    PT Start Time 1100    PT Stop Time 1145    PT Time Calculation (min) 45 min             Past Medical History:  Diagnosis Date   Arthritis    COVID-19 12/2019   Past Surgical History:  Procedure Laterality Date   FRACTURE SURGERY     left arm '84   Patient Active Problem List   Diagnosis Date Noted   Injury of right elbow 10/31/2022   Tophaceous gout, 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   Swelling of first metatarsophalangeal (MTP) joint of left foot 12/28/2019   Viral syndrome 11/08/2019   Triceps tendinitis 12/12/2015   Calcific tendinitis of left Quadriceps insertion 03/27/2015   Primary osteoarthritis of right hand 03/27/2015   Hyperlipidemia 03/16/2015   Dupuytren's contracture of right hand 03/10/2015   Flexor carpi radialis tenosynovitis, right 03/10/2015   Annual physical exam 03/10/2015   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    PCP: Dr Rodney Langton  REFERRING PROVIDER: Dr Ramond Marrow  REFERRING DIAG: Rt distal biceps repair   THERAPY DIAG:  Biceps tendon tear  Other symptoms and signs involving the musculoskeletal system  Abnormal posture  Muscle weakness (generalized)  Rationale for Evaluation and Treatment: Rehabilitation  ONSET DATE: injury 10/15/22; surgery 11/20/22  SUBJECTIVE:                                                                                                                                                                                      SUBJECTIVE STATEMENT:  Saw surgeon yesterday and is cleared to progress with strengthening. Patient reports minimal discomfort in Rt  elbow - in biceps and triceps. Shoulder and elbow ROM are doing well. Now using Rt arm for more functional activities.    PERTINENT HISTORY: Patient reports that he injured Rt biceps 10/16/22 while bowling. He had some pain prior to the bowling but then significant increase in pain when bowling. He underwent surgical repair of Rt distal biceps. He has had continued pain since surgery. Fx Lt arm with ORIF ~40 yrs ago; Lt knee tendonitis  PAIN:  Are you having pain? Yes: NPRS scale: 1/10 with movement; 0/10 when at rest  Pain location: inner and outer elbow  Pain description: aching tired  Aggravating factors: movement  Relieving factors: not moving   PRECAUTIONS: Other: see protocol - 6 weeks - no weight bearing or lifting > 2 pounds; out of elbow brace; active elbow flexion gravity only - no resistive work;  PROM goal full tension free elbow extension at 9 weeks (01/22/23)   WEIGHT BEARING RESTRICTIONS: Yes no weight bearing Rt UE   FALLS:  Has patient fallen in last 6 months? 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 Plans to return to this type work.  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 High-grade near-full thickness nonretracted tear of the distal biceps tendon just proximal to the insertion at the radial tuberosity. Small to moderate volume bicipitoradialis bursitis. 2. Severe tendinosis of the distal triceps tendon without tear. 3. Mild tendinosis at the origin of the common extensor tendons without tear. 4. Advanced fatty atrophy of the flexor carpi radialis muscle, which could  reflect previous trauma or sequela of chronic denervation. 5. Early mild osteoarthritic changes of the elbow joint. No effusion.  PATIENT SURVEYS:  FOTO 4; Goal 51  POSTURE: Patient presents with head forward posture with increased thoracic kyphosis; shoulders rounded and elevated; scapulae abducted and rotated along the thoracic spine; head of the humerus anterior in orientation.   UPPER EXTREMITY ROM: Rt shoulder elevation limited end ranges in brace; Rt elbow limited at 30 degrees in bledso hinged brace locked at 30 deg extension   Active ROM Right eval Right  12/18/22 Right  01/08/23 Right  01/22/23 Left eval  Shoulder flexion       Shoulder extension       Shoulder abduction       Shoulder adduction       Shoulder internal rotation       Shoulder external rotation       Elbow flexion 90 100 122 141 WNL's  Elbow extension -30 -10 0 0 WNL's   Wrist flexion       Wrist extension       Wrist ulnar deviation       Wrist radial deviation       Wrist pronation neutral 65  85   Wrist supination neutral  60  82   (Blank rows = not tested)  UPPER EXTREMITY MMT: strength not assessed resistively   MMT Right eval Left eval  Shoulder flexion    Shoulder extension    Shoulder abduction    Shoulder adduction    Shoulder internal rotation    Shoulder external rotation    Middle trapezius    Lower trapezius    Elbow flexion    Elbow extension    Wrist flexion    Wrist extension    Wrist ulnar deviation    Wrist radial deviation    Wrist pronation    Wrist supination    Grip strength (lbs)    (Blank rows = not tested)  PALPATION:  Muscular tightness Rt upper trap; leveator; pecs; proximal anterior shoulder   TODAY'S [facility] Adult PT  Treatment:  DATE: 02/05/23 Therapeutic Exercise: UBE L4 x 4 min  Biceps stretch 30 sec x 3  Doorway stretch 30 sec x 3 reps each position Row row blue TB 3 sec x 10 x 2  Bow and arrow blue TB  Rt/Lt 3 sec x 10 x 2  Lat pull blue TB 3 sec x 10 x 2  Shoulder extension red TB 3 sec x 10  ER blue TB 3 sec x 10 x 2 IR blue TB 3 sec x 10 x 2 Biceps curl 5# 10 x 3  Hammer curl 5 # 10 x 3  Supination/pronation elbow 90 deg flexion 3# 10 x 2   Wall push up 10 x 1  Plank on table 30 sec x on extended arms(some wrist pain) Plank on table 30 sec x 1; 45 sec x 2 on forearms Bent row 5# KB x 10 x 2 Carry 10# Rt/Lt x 3 laps  AAROM Rt shoulder in supine 10 sec x 3 T in supine noodle along spine ~ 2 min UE's at ~ 40 deg abduction   Manual Therapy: STM Rt upper trap into scapular area Rt shoulder  PROM Rt elbow in supination/pronation elbow at 90 degrees flexion to tissue limits and pt tolerance  PROM Rt elbow flexion and extension ~ 115 deg flexion to -5 deg extension  Neuromuscular re-ed: Postural correction encouraging patient to engage posterior shoulder girdle and avoid sitting with Rt shoulder depressed  Treatment:                                                DATE: 01/29/23 Therapeutic Exercise: Row green TB 3 sec x 10 x 2  Shoulder extension red TB 3 sec x 10  ER red TB 3 sec x 10 x 2 IR red TB 3 sec x 10 x 2 Biceps curl 2# 10 x 2  Hammer curl 2 # 10 x 2  Supination/pronation elbow 90 deg flexion 2# 10 x 2   Wall push up 10 x 2  AAROM Rt shoulder in supine 10 sec x 3 T in supine noodle along spine ~ 2 min UE's at ~ 40 deg abduction   Manual Therapy: STM Rt upper trap into scapular area Rt shoulder  PROM Rt elbow in supination/pronation elbow at 90 degrees flexion to tissue limits and pt tolerance  PROM Rt elbow flexion and extension ~ 115 deg flexion to -5 deg extension  Neuromuscular re-ed: Postural correction encouraging patient to engage posterior shoulder girdle and avoid sitting with Rt shoulder depressed   PATIENT EDUCATION: Education details: POC; HEP Person educated: Patient Education method: Programmer, multimedia, Facilities manager, Actor cues, Verbal cues, and  Handouts Education comprehension: verbalized understanding, returned demonstration, verbal cues required, tactile cues required, and needs further education  HOME EXERCISE PROGRAM: Access Code: [email or web address] URL: [email or web address] Date: 02/05/2023 Prepared by: Corlis Leak  Exercises - Supine Cervical Retraction with Towel  - 2 x daily - 7 x weekly - 1 sets - 5-10 reps - 10 sec  hold - Seated Cervical Retraction  - 2 x daily - 7 x weekly - 1-2 sets - 5-10 reps - 10 sec  hold - Supine Scapular Retraction  - 2 x daily - 7 x weekly - 1 sets - 10 reps - 5-10 sec  hold - Seated Scapular Retraction  - 2 x daily - 7  x weekly - 1-2 sets - 10 reps - 10 sec  hold - Seated Cervical Sidebending AROM  - 2 x daily - 7 x weekly - 1 sets - 5 reps - 5-10 sec  hold - Standing Isometric Shoulder Extension with Doorway - Arm Bent  - 1 x daily - 7 x weekly - 1 sets - 5-10 reps - 5 sec  hold - Standing Isometric Shoulder Abduction with Doorway - Arm Bent  - 1 x daily - 7 x weekly - 1 sets - 5 reps - 5 sec  hold - Standing Isometric Shoulder Adduction with Ball  - 1 x daily - 7 x weekly - 1 sets - 10 reps - 3 sec  hold - Isometric Shoulder External Rotation at Wall  - 1 x daily - 7 x weekly - 1 sets - 10 reps - 3 sec  hold - Standing Shoulder Row Reactive Isometric  - 2 x daily - 7 x weekly - 1 sets - 10 reps - 30-45 sec  hold - Shoulder External Rotation Reactive Isometrics  - 1 x daily - 7 x weekly - 1 sets - 5-10 reps - 10-30 sec  hold - Shoulder Internal Rotation Reactive Isometrics  - 2 x daily - 7 x weekly - 1 sets - 10 reps - 3-5 sec  hold - Shoulder Internal Rotation with Resistance  - 1 x daily - 7 x weekly - 2 sets - 10 reps - 3 sec  hold - Shoulder External Rotation with Anchored Resistance  - 1 x daily - 7 x weekly - 1-2 sets - 10 reps - 3 sec  hold - Wall Push Up  - 1 x daily - 7 x weekly - 1-3 sets - 10 reps - 3 sec  hold - Standing Pronated Elbow Flexion with Dumbbell  - 1 x daily - 7 x  weekly - 1-3 sets - 10 reps - 3 sec  hold - Standing Bicep Curls Neutral with Dumbbells  - 1 x daily - 7 x weekly - 1-3 sets - 10 reps - 3 sec  hold - Forearm Pronation and Supination with Hammer  - 1 x daily - 7 x weekly - 1-3 sets - 10 reps - 3 sec  hold - Supine Chest Stretch on Foam Roll  - 2 x daily - 7 x weekly - 1 sets - 1 reps - 2-5 min  sec  hold - Standing Bilateral Low Shoulder Row with Anchored Resistance  - 1 x daily - 7 x weekly - 1-3 sets - 10 reps - 2-3 sec  hold - Drawing Bow  - 1 x daily - 7 x weekly - 1-3 sets - 10 reps - 3 sec  hold - Standing Lat Pull Down with Resistance - Elbows Bent  - 2 x daily - 7 x weekly - 1-3 sets - 10 reps - 3 sec  hold - Standing Bent Over Single Arm Scapular Row with Table Support with PLB  - 2 x daily - 7 x weekly - 1-3 sets - 10 reps - 3 sec  hold  ASSESSMENT:  CLINICAL IMPRESSION:   Patient reports some soreness from exercises last week which lasted ~ 1 day and resolved.He has increased exercise at home and can tell he is progressing with resistive loading. Minimal discomfort in Rt elbow or shoulder with exercises. Progressed resistive exercises in clinic and for home.   NOTE: Patient reports MD released him to begin strengthening in preparation for  RTW and does not plan to see patient again. Released to RTW June.  See job description from work so we can progress strengthening to prepare for RTW.  OBJECTIVE IMPAIRMENTS: Patient is a 57 y.o. male who was seen today for physical therapy evaluation and treatment s/p Rt distal biceps tendon repair 11/20/22. Injury to Rt elbow reported 10/16/22. Decreased mobility, decreased ROM, decreased strength, hypomobility, increased edema, increased fascial restrictions, increased muscle spasms, impaired flexibility, impaired UE functional use, improper body mechanics, postural dysfunction, and pain.   GOALS: Goals reviewed with patient? Yes  SHORT TERM GOALS: Target date: 01/27/2023  Increase ROM Rt elbow  flexion/extension to full range Baseline: Goal status: INITIAL  2.  Begin active exercise Rt UE per protocol  Baseline:  Goal status: INITIAL   LONG TERM GOALS: Target date: 02/24/2023   Full pain free ROM Rt shoulder and elbow Baseline:  Goal status: INITIAL  2.  Increase strength Rt UE allowing patient to return to normal functional activities including preparation for RTW  Baseline:  Goal status: INITIAL  3.  Return to bowling as MD approves  Baseline:  Goal status: INITIAL  4.  Patient demonstrates improved posture and alignment with posterior shoulder girdle engaged  Baseline:  Goal status: INITIAL  5.  Independent in HEP including aquatic program as indicated  Baseline:  Goal status: INITIAL  6.  Improve functional limitation score to 51  Baseline: 4 Goal status: INITIAL  PLAN:  PT FREQUENCY: 2x/week  PT DURATION: 12 weeks  PLANNED INTERVENTIONS: Therapeutic exercises, Therapeutic activity, Neuromuscular re-education, Patient/Family education, Self Care, Joint mobilization, Aquatic Therapy, Dry Needling, Electrical stimulation, Spinal mobilization, Cryotherapy, Moist heat, Taping, Vasopneumatic device, Ultrasound, Ionotophoresis 4mg /ml Dexamethasone, Manual therapy, and Re-evaluation  PLAN FOR NEXT SESSION: review and progress exercises; manual therapy and PROM Rt UE per protocol; manual work; DN; modalities as indicated   [employer], PT 02/05/2023, 11:05 AM

## 2023-02-10 ENCOUNTER — Encounter: Payer: Self-pay | Admitting: Rehabilitative and Restorative Service Providers"

## 2023-02-10 ENCOUNTER — Ambulatory Visit: Payer: 59 | Admitting: Rehabilitative and Restorative Service Providers"

## 2023-02-10 DIAGNOSIS — R293 Abnormal posture: Secondary | ICD-10-CM

## 2023-02-10 DIAGNOSIS — S46219A Strain of muscle, fascia and tendon of other parts of biceps, unspecified arm, initial encounter: Secondary | ICD-10-CM | POA: Diagnosis not present

## 2023-02-10 DIAGNOSIS — M6281 Muscle weakness (generalized): Secondary | ICD-10-CM

## 2023-02-10 DIAGNOSIS — R29898 Other symptoms and signs involving the musculoskeletal system: Secondary | ICD-10-CM

## 2023-02-10 NOTE — Therapy (Signed)
OUTPATIENT PHYSICAL THERAPY SHOULDER TREATMENT   Patient Name: Darren Mcdaniel MRN: [id number] DOB:November 10, 1965, 57 y.o., male Today's Date: 02/10/2023  END OF SESSION:  PT End of Session - 02/10/23 1016     Visit Number 13    Number of Visits 24    Date for PT Re-Evaluation 02/24/23    Authorization - Visit Number 13    Authorization - Number of Visits 30    PT Start Time 1014    PT Stop Time 1100    PT Time Calculation (min) 46 min    Activity Tolerance Patient tolerated treatment well             Past Medical History:  Diagnosis Date   Arthritis    COVID-19 12/2019   Past Surgical History:  Procedure Laterality Date   FRACTURE SURGERY     left arm '84   Patient Active Problem List   Diagnosis Date Noted   Injury of right elbow 10/31/2022   Tophaceous gout, 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   Swelling of first metatarsophalangeal (MTP) joint of left foot 12/28/2019   Viral syndrome 11/08/2019   Triceps tendinitis 12/12/2015   Calcific tendinitis of left Quadriceps insertion 03/27/2015   Primary osteoarthritis of right hand 03/27/2015   Hyperlipidemia 03/16/2015   Dupuytren's contracture of right hand 03/10/2015   Flexor carpi radialis tenosynovitis, right 03/10/2015   Annual physical exam 03/10/2015   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    PCP: Dr Rodney Langton  REFERRING PROVIDER: Dr Ramond Marrow  REFERRING DIAG: Rt distal biceps repair   THERAPY DIAG:  Biceps tendon tear  Other symptoms and signs involving the musculoskeletal system  Abnormal posture  Muscle weakness (generalized)  Rationale for Evaluation and Treatment: Rehabilitation  ONSET DATE: injury 10/15/22; surgery 11/20/22  SUBJECTIVE:                                                                                                                                                                                      SUBJECTIVE STATEMENT:  Patient reports that he was sore for two days following  last visit. He has minimal discomfort in Rt elbow - in biceps and triceps. Shoulder and elbow ROM are doing well. Now using Rt arm for more functional activities.    Surgeon cleared patient to progress with Rt UE strengthening  PERTINENT HISTORY: Patient reports that he injured Rt biceps 10/16/22 while bowling. He had some pain prior to the bowling but then significant increase in pain when bowling. He underwent surgical repair of Rt distal biceps. He has had continued pain since surgery. Fx Lt arm with ORIF ~40 yrs ago; Lt knee tendonitis  PAIN:  Are you having pain? Yes: NPRS scale: 1/10 with movement; 0/10 when at rest  Pain location: inner and outer elbow   Pain description: aching tired  Aggravating factors: movement  Relieving factors: not moving   PRECAUTIONS: Other: see protocol - 6 weeks - no weight bearing or lifting > 2 pounds; out of elbow brace; active elbow flexion gravity only - no resistive work;  PROM goal full tension free elbow extension at 9 weeks (01/22/23)   WEIGHT BEARING RESTRICTIONS: Yes no weight bearing Rt UE   FALLS:  Has patient fallen in last 6 months? 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 Plans to return to this type work.  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 High-grade near-full thickness nonretracted tear of the distal biceps tendon just proximal to the insertion at the radial tuberosity. Small to moderate volume bicipitoradialis bursitis. 2. Severe tendinosis of the distal triceps tendon without tear. 3. Mild tendinosis at the origin of the common  extensor tendons without tear. 4. Advanced fatty atrophy of the flexor carpi radialis muscle, which could reflect previous trauma or sequela of chronic denervation. 5. Early mild osteoarthritic changes of the elbow joint. No effusion.  PATIENT SURVEYS:  FOTO 4; Goal 51  POSTURE: Patient presents with head forward posture with increased thoracic kyphosis; shoulders rounded and elevated; scapulae abducted and rotated along the thoracic spine; head of the humerus anterior in orientation.   UPPER EXTREMITY ROM: Rt shoulder elevation limited end ranges in brace; Rt elbow limited at 30 degrees in bledso hinged brace locked at 30 deg extension   Active ROM Right eval Right  12/18/22 Right  01/08/23 Right  01/22/23 Left eval  Shoulder flexion       Shoulder extension       Shoulder abduction       Shoulder adduction       Shoulder internal rotation       Shoulder external rotation       Elbow flexion 90 100 122 141 WNL's  Elbow extension -30 -10 0 0 WNL's   Wrist flexion       Wrist extension       Wrist ulnar deviation       Wrist radial deviation       Wrist pronation neutral 65  85   Wrist supination neutral  60  82   (Blank rows = not tested)  UPPER EXTREMITY MMT: strength not assessed resistively   MMT Right eval Left eval  Shoulder flexion    Shoulder extension    Shoulder abduction    Shoulder adduction    Shoulder internal rotation    Shoulder external rotation    Middle trapezius    Lower trapezius    Elbow flexion    Elbow extension    Wrist flexion    Wrist extension    Wrist ulnar deviation    Wrist radial deviation    Wrist pronation    Wrist supination    Grip strength (lbs)    (Blank rows = not tested)  PALPATION:  Muscular tightness Rt upper trap; leveator; pecs; proximal anterior shoulder   TODAY'S [facility] Adult PT   Treatment:                                                DATE: 02/10/23 Therapeutic Exercise: UBE L6 x 4 min  Biceps stretch 30 sec  x 3  Doorway stretch 30 sec x 3 reps each position Row row blue TB 3 sec x 10 x 2  Bow and arrow blue TB Rt/Lt 3 sec x 10 x 2  Lat pull blue TB 3 sec x 10 x 2  Shoulder extension red TB 3 sec x 10  ER blue TB 3 sec x 10 x 2 IR blue TB 3 sec x 10 x 2 Biceps curl 5# 10 x 3  Hammer curl 5 # 10 x 3  Supination/pronation elbow 90 deg flexion 3# 10 x 2   Wall push up 10 x 1  Plank on table 30 sec x on extended arms(some wrist pain) Plank on table 30 sec x 1; 45 sec x 2 on forearms Bent row 5# KB x 10 x 2 Carry 10# Rt/Lt x 3 laps  AAROM Rt shoulder in supine 10 sec x 3 T in supine noodle along spine ~ 2 min UE's at ~ 40 deg abduction   Manual Therapy: STM Rt upper trap into scapular area Rt shoulder  PROM Rt elbow in supination/pronation elbow at 90 degrees flexion to tissue limits and pt tolerance  PROM Rt elbow flexion and extension ~ 115 deg flexion to -5 deg extension  Neuromuscular re-ed: Postural correction encouraging patient to engage posterior shoulder girdle and avoid sitting with Rt shoulder depressed  Treatment:                                                DATE: 02/05/23 Therapeutic Exercise: UBE L4 x 4 min  Biceps stretch 30 sec x 3  Doorway stretch 30 sec x 3 reps each position Row row blue TB 3 sec x 10 x 2  Bow and arrow blue TB Rt/Lt 3 sec x 10 x2 Lat pull blue TB 3 sec x 10 x 2 (HEP) Shoulder extension green TB 3 sec x 10  ER blue TB 3 sec x 10 x 2 (HEP) IR blue TB 3 sec x 10 x 2 (HEP)  Biceps curl 5# x 2 on bamboo pole x 10 x 2 Overhead press 5# on bamboo pole x 10 x 2  Hammer curl 5 # 10 x 3  Supination/pronation elbow 90 deg flexion 3# 10 x 2   Wall push up 10 x 2  Plank on table 60 sec x 3 Box lift 17# lift and turn to rest of waist high surface x 20 x 2 Bent row 5# KB x 10 x 2 Carry 15# Rt/Lt x 3 laps  Bouncing ball on head overhead x ~2 min UE stretch at wall 30 sec x 2 L position  AAROM Rt shoulder in supine 10 sec x 3 (HEP) T in supine noodle along  spine ~ 2 min UE's at ~ 40 deg abduction (HEP)  Manual Therapy: STM Rt upper trap into scapular area Rt shoulder  PROM Rt elbow in supination/pronation elbow at 90 degrees flexion to tissue limits and pt tolerance  PROM Rt elbow flexion and extension ~ 115 deg flexion to -5 deg extension  Neuromuscular re-ed: Postural correction encouraging patient to engage posterior shoulder girdle and avoid sitting with Rt shoulder depressed   PATIENT EDUCATION: Education details: POC; HEP Person educated: Patient Education method: Programmer, multimedia, Facilities manager, Actor cues, Verbal cues, and Handouts Education comprehension: verbalized understanding, returned demonstration, verbal cues required, tactile cues required, and needs further education  HOME EXERCISE PROGRAM: Access Code: [email or web address] URL: [email or web address] Date: 02/05/2023 Prepared by: Corlis Leak  Exercises - Supine Cervical Retraction with Towel  - 2 x daily - 7 x weekly - 1 sets - 5-10 reps - 10 sec  hold - Seated Cervical Retraction  - 2 x daily - 7 x weekly - 1-2 sets - 5-10 reps - 10 sec  hold - Supine Scapular Retraction  - 2 x daily - 7 x weekly - 1 sets - 10 reps - 5-10 sec  hold - Seated Scapular Retraction  - 2 x daily - 7 x weekly - 1-2 sets - 10 reps - 10 sec  hold - Seated Cervical Sidebending AROM  - 2 x daily - 7 x weekly - 1 sets - 5 reps - 5-10 sec  hold - Standing Isometric Shoulder Extension with Doorway - Arm Bent  - 1 x daily - 7 x weekly - 1 sets - 5-10 reps - 5 sec  hold - Standing Isometric Shoulder Abduction with Doorway - Arm Bent  - 1 x daily - 7 x weekly - 1 sets - 5 reps - 5 sec  hold - Standing Isometric Shoulder Adduction with Ball  - 1 x daily - 7 x weekly - 1 sets - 10 reps - 3 sec  hold - Isometric Shoulder External Rotation at Wall  - 1 x daily - 7 x weekly - 1 sets - 10 reps - 3 sec  hold - Standing Shoulder Row Reactive Isometric  - 2 x daily - 7 x weekly - 1 sets - 10 reps - 30-45 sec   hold - Shoulder External Rotation Reactive Isometrics  - 1 x daily - 7 x weekly - 1 sets - 5-10 reps - 10-30 sec  hold - Shoulder Internal Rotation Reactive Isometrics  - 2 x daily - 7 x weekly - 1 sets - 10 reps - 3-5 sec  hold - Shoulder Internal Rotation with Resistance  - 1 x daily - 7 x weekly - 2 sets - 10 reps - 3 sec  hold - Shoulder External Rotation with Anchored Resistance  - 1 x daily - 7 x weekly - 1-2 sets - 10 reps - 3 sec  hold - Wall Push Up  - 1 x daily - 7 x weekly - 1-3 sets - 10 reps - 3 sec  hold - Standing Pronated Elbow Flexion with Dumbbell  - 1 x daily - 7 x weekly - 1-3 sets - 10 reps - 3 sec  hold - Standing Bicep Curls Neutral with Dumbbells  - 1 x daily - 7 x weekly - 1-3 sets - 10 reps - 3 sec  hold - Forearm Pronation and Supination with Hammer  - 1 x daily - 7 x weekly - 1-3 sets - 10 reps - 3 sec  hold - Supine Chest Stretch on Foam Roll  - 2 x daily - 7 x weekly - 1 sets - 1 reps -  2-5 min  sec  hold - Standing Bilateral Low Shoulder Row with Anchored Resistance  - 1 x daily - 7 x weekly - 1-3 sets - 10 reps - 2-3 sec  hold - Drawing Bow  - 1 x daily - 7 x weekly - 1-3 sets - 10 reps - 3 sec  hold - Standing Lat Pull Down with Resistance - Elbows Bent  - 2 x daily - 7 x weekly - 1-3 sets - 10 reps - 3 sec  hold - Standing Bent Over Single Arm Scapular Row with Table Support with PLB  - 2 x daily - 7 x weekly - 1-3 sets - 10 reps - 3 sec  hold  ASSESSMENT:  CLINICAL IMPRESSION:   Patient reports soreness from exercises lasting ~ 2 days. Working on exercises and using arm for more functional activities. He has increased exercise at home and can tell he is progressing with resistive loading. Minimal discomfort in Rt elbow or shoulder with exercises. Progressed resistive exercises in clinic and for home.   NOTE: Patient reports MD released him to begin strengthening in preparation for RTW and does not plan to see patient again. Released to RTW June.  See job  description from work so we can progress strengthening to prepare for RTW.  OBJECTIVE IMPAIRMENTS: Patient is a 57 y.o. male who was seen today for physical therapy evaluation and treatment s/p Rt distal biceps tendon repair 11/20/22. Injury to Rt elbow reported 10/16/22. Decreased mobility, decreased ROM, decreased strength, hypomobility, increased edema, increased fascial restrictions, increased muscle spasms, impaired flexibility, impaired UE functional use, improper body mechanics, postural dysfunction, and pain.   GOALS: Goals reviewed with patient? Yes  SHORT TERM GOALS: Target date: 01/27/2023  Increase ROM Rt elbow flexion/extension to full range Baseline: Goal status: INITIAL  2.  Begin active exercise Rt UE per protocol  Baseline:  Goal status: INITIAL   LONG TERM GOALS: Target date: 02/24/2023   Full pain free ROM Rt shoulder and elbow Baseline:  Goal status: INITIAL  2.  Increase strength Rt UE allowing patient to return to normal functional activities including preparation for RTW  Baseline:  Goal status: INITIAL  3.  Return to bowling as MD approves  Baseline:  Goal status: INITIAL  4.  Patient demonstrates improved posture and alignment with posterior shoulder girdle engaged  Baseline:  Goal status: INITIAL  5.  Independent in HEP including aquatic program as indicated  Baseline:  Goal status: INITIAL  6.  Improve functional limitation score to 51  Baseline: 4 Goal status: INITIAL  PLAN:  PT FREQUENCY: 2x/week  PT DURATION: 12 weeks  PLANNED INTERVENTIONS: Therapeutic exercises, Therapeutic activity, Neuromuscular re-education, Patient/Family education, Self Care, Joint mobilization, Aquatic Therapy, Dry Needling, Electrical stimulation, Spinal mobilization, Cryotherapy, Moist heat, Taping, Vasopneumatic device, Ultrasound, Ionotophoresis 4mg /ml Dexamethasone, Manual therapy, and Re-evaluation  PLAN FOR NEXT SESSION: review and progress exercises; manual  therapy and PROM Rt UE per protocol; manual work; DN; modalities as indicated   [employer], PT 02/10/2023, 10:16 AM

## 2023-02-12 ENCOUNTER — Ambulatory Visit: Payer: 59 | Attending: Orthopaedic Surgery | Admitting: Rehabilitative and Restorative Service Providers"

## 2023-02-12 ENCOUNTER — Encounter: Payer: Self-pay | Admitting: Rehabilitative and Restorative Service Providers"

## 2023-02-12 DIAGNOSIS — M6281 Muscle weakness (generalized): Secondary | ICD-10-CM

## 2023-02-12 DIAGNOSIS — R293 Abnormal posture: Secondary | ICD-10-CM | POA: Diagnosis present

## 2023-02-12 DIAGNOSIS — R29898 Other symptoms and signs involving the musculoskeletal system: Secondary | ICD-10-CM | POA: Diagnosis present

## 2023-02-12 DIAGNOSIS — S46219A Strain of muscle, fascia and tendon of other parts of biceps, unspecified arm, initial encounter: Secondary | ICD-10-CM | POA: Insufficient documentation

## 2023-02-12 NOTE — Therapy (Signed)
OUTPATIENT PHYSICAL THERAPY SHOULDER TREATMENT   Patient Name: Darren Mcdaniel MRN: [id number] DOB:03/03/1966, 57 y.o., male Today's Date: 02/12/2023  END OF SESSION:  PT End of Session - 02/12/23 1100     Visit Number 14    Number of Visits 24    Date for PT Re-Evaluation 02/24/23    Authorization - Visit Number 14    Authorization - Number of Visits 30    PT Start Time 1058    PT Stop Time 1145    PT Time Calculation (min) 47 min    Activity Tolerance Patient tolerated treatment well             Past Medical History:  Diagnosis Date   Arthritis    COVID-19 12/2019   Past Surgical History:  Procedure Laterality Date   FRACTURE SURGERY     left arm '84   Patient Active Problem List   Diagnosis Date Noted   Injury of right elbow 10/31/2022   Tophaceous gout, 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   Swelling of first metatarsophalangeal (MTP) joint of left foot 12/28/2019   Viral syndrome 11/08/2019   Triceps tendinitis 12/12/2015   Calcific tendinitis of left Quadriceps insertion 03/27/2015   Primary osteoarthritis of right hand 03/27/2015   Hyperlipidemia 03/16/2015   Dupuytren's contracture of right hand 03/10/2015   Flexor carpi radialis tenosynovitis, right 03/10/2015   Annual physical exam 03/10/2015   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    PCP: Dr Rodney Langton  REFERRING PROVIDER: Dr Ramond Marrow  REFERRING DIAG: Rt distal biceps repair   THERAPY DIAG:  Biceps tendon tear  Other symptoms and signs involving the musculoskeletal system  Abnormal posture  Muscle weakness (generalized)  Rationale for Evaluation and Treatment: Rehabilitation  ONSET DATE: injury 10/15/22; surgery 11/20/22  SUBJECTIVE:                                                                                                                                                                                      SUBJECTIVE STATEMENT:  Patient reports that he was sore for two days following  last visit. He has minimal discomfort in Rt elbow - in biceps and triceps. Shoulder and elbow ROM are doing well. Now using Rt arm for more functional activities.    Surgeon cleared patient to progress with Rt UE strengthening  PERTINENT HISTORY: Patient reports that he injured Rt biceps 10/16/22 while bowling. He had some pain prior to the bowling but then significant increase in pain when bowling. He underwent surgical repair of Rt distal biceps. He has had continued pain since surgery. Fx Lt arm with ORIF ~40 yrs ago; Lt knee tendonitis  PAIN:  Are you having pain? Yes: NPRS scale: 1/10 with movement; 0/10 when at rest  Pain location: inner and outer elbow   Pain description: aching tired  Aggravating factors: movement  Relieving factors: not moving   PRECAUTIONS: Other: see protocol - 6 weeks - no weight bearing or lifting > 2 pounds; out of elbow brace; active elbow flexion gravity only - no resistive work;  PROM goal full tension free elbow extension at 9 weeks (01/22/23)   WEIGHT BEARING RESTRICTIONS: Yes no weight bearing Rt UE   FALLS:  Has patient fallen in last 6 months? 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 Plans to return to this type work.  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 High-grade near-full thickness nonretracted tear of the distal biceps tendon just proximal to the insertion at the radial tuberosity. Small to moderate volume bicipitoradialis bursitis. 2. Severe tendinosis of the distal triceps tendon without tear. 3. Mild tendinosis at the origin of the common  extensor tendons without tear. 4. Advanced fatty atrophy of the flexor carpi radialis muscle, which could reflect previous trauma or sequela of chronic denervation. 5. Early mild osteoarthritic changes of the elbow joint. No effusion.  PATIENT SURVEYS:  FOTO 4; Goal 51  POSTURE: Patient presents with head forward posture with increased thoracic kyphosis; shoulders rounded and elevated; scapulae abducted and rotated along the thoracic spine; head of the humerus anterior in orientation.   UPPER EXTREMITY ROM: Rt shoulder elevation limited end ranges in brace; Rt elbow limited at 30 degrees in bledso hinged brace locked at 30 deg extension   Active ROM Right eval Right  12/18/22 Right  01/08/23 Right  01/22/23 Left eval  Shoulder flexion       Shoulder extension       Shoulder abduction       Shoulder adduction       Shoulder internal rotation       Shoulder external rotation       Elbow flexion 90 100 122 141 WNL's  Elbow extension -30 -10 0 0 WNL's   Wrist flexion       Wrist extension       Wrist ulnar deviation       Wrist radial deviation       Wrist pronation neutral 65  85   Wrist supination neutral  60  82   (Blank rows = not tested)  UPPER EXTREMITY MMT: strength not assessed resistively   MMT Right eval Left eval  Shoulder flexion    Shoulder extension    Shoulder abduction    Shoulder adduction    Shoulder internal rotation    Shoulder external rotation    Middle trapezius    Lower trapezius    Elbow flexion    Elbow extension    Wrist flexion    Wrist extension    Wrist ulnar deviation    Wrist radial deviation    Wrist pronation    Wrist supination    Grip strength (lbs)    (Blank rows = not tested)  PALPATION:  Muscular tightness Rt upper trap; leveator; pecs; proximal anterior shoulder   TODAY'S [facility] Adult PT   Treatment:                                                DATE: 02/12/23 Therapeutic Exercise: UBE L7 x 4 min alt fwd/back Bodyblade  flexion 1 min x 2 Rt/Lt  Bodyblade bilat UE's chest to overhead x 2  Wt bearing on wobble board with air-ex cushion on top for rocking side 2 min  Wall pushup on air-ex push up/clap x 20 Triceps press up on yoga blocks 3 sec x 10 x 2  Biceps stretch 30 sec x 3  Doorway stretch 30 sec x 3 reps each position Row row blue TB 3 sec x 10 x 2  Bow and arrow blue TB Rt/Lt 3 sec x 10 x 2  Lat pull blue TB 3 sec x 10 x 2  Shoulder extension red TB 3 sec x 10  ER blue TB 3 sec x 10 x 2 IR blue TB 3 sec x 10 x 2 Biceps curl 5# 10 x 2  Hammer curl 5# 10 x 3  Supination/pronation elbow 90 deg flexion red TB - x 10 reps   Recumbent bike L3 x 5 min  Plank on low table 30 sec x 1; 45 sec x 2 on forearms Bent row 5# KB x 10 x 2 Overhead carry 5# DB each UE x 4 laps  [employer] 15# Rt/Lt x 4 laps  Shoulder flexion with trunk rotation green TB simulation of bowling x 10 x 2   (Add antirotation next visit)  Manual Therapy:  Neuromuscular re-ed: Postural correction encouraging patient to engage posterior shoulder girdle and avoid sitting with Rt shoulder depressed   Treatment:                                                DATE: 02/10/23 Therapeutic Exercise: UBE L6 x 4 min  Biceps stretch 30 sec x 3  Doorway stretch 30 sec x 3 reps each position Row row blue TB 3 sec x 10 x 2  Bow and arrow blue TB Rt/Lt 3 sec x 10 x 2  Lat pull blue TB 3 sec x 10 x 2  Shoulder extension red TB 3 sec x 10  ER blue TB 3 sec x 10 x 2 IR blue TB 3 sec x 10 x 2 Biceps curl 5# 10 x 3  Hammer curl 5 # 10 x 3  Supination/pronation elbow 90 deg flexion 3# 10 x 2   Wall push up 10 x 1  Plank on table 30 sec x on extended arms(some wrist pain) Plank on table 30 sec x 1; 45 sec x 2 on forearms Bent row 5# KB x 10 x 2 Carry 15# Rt/Lt x 3 laps  AAROM Rt shoulder in supine 10 sec x 3 T in supine noodle along spine ~ 2 min UE's at ~ 40 deg abduction   Manual Therapy: STM Rt upper trap into scapular area Rt  shoulder  PROM Rt elbow in supination/pronation elbow at 90 degrees flexion to tissue limits and pt tolerance  PROM Rt elbow flexion and extension ~  115 deg flexion to -5 deg extension  Neuromuscular re-ed: Postural correction encouraging patient to engage posterior shoulder girdle and avoid sitting with Rt shoulder depressed  PATIENT EDUCATION: Education details: POC; HEP Person educated: Patient Education method: Explanation, Demonstration, Tactile cues, Verbal cues, and Handouts Education comprehension: verbalized understanding, returned demonstration, verbal cues required, tactile cues required, and needs further education  HOME EXERCISE PROGRAM: Access Code: [email or web address] URL: [email or web address] Date: 02/05/2023 Prepared by: Corlis Leak  Exercises - Supine Cervical Retraction with Towel  - 2 x daily - 7 x weekly - 1 sets - 5-10 reps - 10 sec  hold - Seated Cervical Retraction  - 2 x daily - 7 x weekly - 1-2 sets - 5-10 reps - 10 sec  hold - Supine Scapular Retraction  - 2 x daily - 7 x weekly - 1 sets - 10 reps - 5-10 sec  hold - Seated Scapular Retraction  - 2 x daily - 7 x weekly - 1-2 sets - 10 reps - 10 sec  hold - Seated Cervical Sidebending AROM  - 2 x daily - 7 x weekly - 1 sets - 5 reps - 5-10 sec  hold - Standing Isometric Shoulder Extension with Doorway - Arm Bent  - 1 x daily - 7 x weekly - 1 sets - 5-10 reps - 5 sec  hold - Standing Isometric Shoulder Abduction with Doorway - Arm Bent  - 1 x daily - 7 x weekly - 1 sets - 5 reps - 5 sec  hold - Standing Isometric Shoulder Adduction with Ball  - 1 x daily - 7 x weekly - 1 sets - 10 reps - 3 sec  hold - Isometric Shoulder External Rotation at Wall  - 1 x daily - 7 x weekly - 1 sets - 10 reps - 3 sec  hold - Standing Shoulder Row Reactive Isometric  - 2 x daily - 7 x weekly - 1 sets - 10 reps - 30-45 sec  hold - Shoulder External Rotation Reactive Isometrics  - 1 x daily - 7 x weekly - 1 sets - 5-10 reps - 10-30  sec  hold - Shoulder Internal Rotation Reactive Isometrics  - 2 x daily - 7 x weekly - 1 sets - 10 reps - 3-5 sec  hold - Shoulder Internal Rotation with Resistance  - 1 x daily - 7 x weekly - 2 sets - 10 reps - 3 sec  hold - Shoulder External Rotation with Anchored Resistance  - 1 x daily - 7 x weekly - 1-2 sets - 10 reps - 3 sec  hold - Wall Push Up  - 1 x daily - 7 x weekly - 1-3 sets - 10 reps - 3 sec  hold - Standing Pronated Elbow Flexion with Dumbbell  - 1 x daily - 7 x weekly - 1-3 sets - 10 reps - 3 sec  hold - Standing Bicep Curls Neutral with Dumbbells  - 1 x daily - 7 x weekly - 1-3 sets - 10 reps - 3 sec  hold - Forearm Pronation and Supination with Hammer  - 1 x daily - 7 x weekly - 1-3 sets - 10 reps - 3 sec  hold - Supine Chest Stretch on Foam Roll  - 2 x daily - 7 x weekly - 1 sets - 1 reps - 2-5 min  sec  hold - Standing Bilateral Low Shoulder Row with Anchored Resistance  - 1 x daily - 7 x weekly -  1-3 sets - 10 reps - 2-3 sec  hold - Drawing Bow  - 1 x daily - 7 x weekly - 1-3 sets - 10 reps - 3 sec  hold - Standing Lat Pull Down with Resistance - Elbows Bent  - 2 x daily - 7 x weekly - 1-3 sets - 10 reps - 3 sec  hold - Standing Bent Over Single Arm Scapular Row with Table Support with PLB  - 2 x daily - 7 x weekly - 1-3 sets - 10 reps - 3 sec  hold  ASSESSMENT:  CLINICAL IMPRESSION:   Patient reports some soreness in Rt arm - biceps and triceps. Continued with UE strengthening adding fast work and explosive wall push up as well as carrying. Working on exercises and using arm for more functional activities. He has increased exercise at home and can tell he is progressing with resistive loading. Minimal discomfort in Rt elbow or shoulder with exercises. Progressed resistive exercises in clinic and for home.   NOTE: Patient reports MD released him to begin strengthening in preparation for RTW and does not plan to see patient again. Released to RTW June.  See job description  from work so we can progress strengthening to prepare for RTW.  OBJECTIVE IMPAIRMENTS: Patient is a 57 y.o. male who was seen today for physical therapy evaluation and treatment s/p Rt distal biceps tendon repair 11/20/22. Injury to Rt elbow reported 10/16/22. Decreased mobility, decreased ROM, decreased strength, hypomobility, increased edema, increased fascial restrictions, increased muscle spasms, impaired flexibility, impaired UE functional use, improper body mechanics, postural dysfunction, and pain.   GOALS: Goals reviewed with patient? Yes  SHORT TERM GOALS: Target date: 01/27/2023  Increase ROM Rt elbow flexion/extension to full range Baseline: Goal status: INITIAL  2.  Begin active exercise Rt UE per protocol  Baseline:  Goal status: INITIAL   LONG TERM GOALS: Target date: 02/24/2023   Full pain free ROM Rt shoulder and elbow Baseline:  Goal status: INITIAL  2.  Increase strength Rt UE allowing patient to return to normal functional activities including preparation for RTW  Baseline:  Goal status: INITIAL  3.  Return to bowling as MD approves  Baseline:  Goal status: INITIAL  4.  Patient demonstrates improved posture and alignment with posterior shoulder girdle engaged  Baseline:  Goal status: INITIAL  5.  Independent in HEP including aquatic program as indicated  Baseline:  Goal status: INITIAL  6.  Improve functional limitation score to 51  Baseline: 4 Goal status: INITIAL  PLAN:  PT FREQUENCY: 2x/week  PT DURATION: 12 weeks  PLANNED INTERVENTIONS: Therapeutic exercises, Therapeutic activity, Neuromuscular re-education, Patient/Family education, Self Care, Joint mobilization, Aquatic Therapy, Dry Needling, Electrical stimulation, Spinal mobilization, Cryotherapy, Moist heat, Taping, Vasopneumatic device, Ultrasound, Ionotophoresis 4mg /ml Dexamethasone, Manual therapy, and Re-evaluation  PLAN FOR NEXT SESSION: review and progress exercises; manual therapy and  PROM Rt UE per protocol; manual work; DN; modalities as indicated   [employer], PT 02/12/2023, 11:01 AM

## 2023-02-17 ENCOUNTER — Encounter: Payer: Self-pay | Admitting: Rehabilitative and Restorative Service Providers"

## 2023-02-17 ENCOUNTER — Ambulatory Visit: Payer: 59 | Admitting: Rehabilitative and Restorative Service Providers"

## 2023-02-17 DIAGNOSIS — S46219A Strain of muscle, fascia and tendon of other parts of biceps, unspecified arm, initial encounter: Secondary | ICD-10-CM

## 2023-02-17 DIAGNOSIS — R29898 Other symptoms and signs involving the musculoskeletal system: Secondary | ICD-10-CM

## 2023-02-17 DIAGNOSIS — M6281 Muscle weakness (generalized): Secondary | ICD-10-CM

## 2023-02-17 DIAGNOSIS — R293 Abnormal posture: Secondary | ICD-10-CM

## 2023-02-17 NOTE — Therapy (Signed)
OUTPATIENT PHYSICAL THERAPY SHOULDER TREATMENT   Patient Name: Darren Mcdaniel MRN: [id number] DOB:1966-05-18, 57 y.o., male Today's Date: 02/17/2023  END OF SESSION:  PT End of Session - 02/17/23 1105     Visit Number 15    Number of Visits 24    Date for PT Re-Evaluation 02/24/23    Authorization - Visit Number 15    Authorization - Number of Visits 30    PT Start Time 1100    PT Stop Time 1148    PT Time Calculation (min) 48 min    Activity Tolerance Patient tolerated treatment well             Past Medical History:  Diagnosis Date   Arthritis    COVID-19 12/2019   Past Surgical History:  Procedure Laterality Date   FRACTURE SURGERY     left arm '84   Patient Active Problem List   Diagnosis Date Noted   Injury of right elbow 10/31/2022   Tophaceous gout, 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   Swelling of first metatarsophalangeal (MTP) joint of left foot 12/28/2019   Viral syndrome 11/08/2019   Triceps tendinitis 12/12/2015   Calcific tendinitis of left Quadriceps insertion 03/27/2015   Primary osteoarthritis of right hand 03/27/2015   Hyperlipidemia 03/16/2015   Dupuytren's contracture of right hand 03/10/2015   Flexor carpi radialis tenosynovitis, right 03/10/2015   Annual physical exam 03/10/2015   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    PCP: Dr Rodney Langton  REFERRING PROVIDER: Dr Ramond Marrow  REFERRING DIAG: Rt distal biceps repair   THERAPY DIAG:  Biceps tendon tear  Other symptoms and signs involving the musculoskeletal system  Abnormal posture  Muscle weakness (generalized)  Rationale for Evaluation and Treatment: Rehabilitation  ONSET DATE: injury 10/15/22; surgery 11/20/22  SUBJECTIVE:                                                                                                                                                                                      SUBJECTIVE STATEMENT:  Patient reports that he was sore for two days following  last visit. He has minimal discomfort in Rt elbow - in biceps and triceps. Shoulder and elbow ROM are doing well. Now using Rt arm for more functional activities.    Surgeon cleared patient to progress with Rt UE strengthening  PERTINENT HISTORY: Patient reports that he injured Rt biceps 10/16/22 while bowling. He had some pain prior to the bowling but then significant increase in pain when bowling. He underwent surgical repair of Rt distal biceps. He has had continued pain since surgery. Fx Lt arm with ORIF ~40 yrs ago; Lt knee tendonitis  PAIN:  Are you having pain? Yes: NPRS scale: 1/10 with movement; 0/10 when at rest  Pain location: inner and outer elbow   Pain description: aching tired  Aggravating factors: movement  Relieving factors: not moving   PRECAUTIONS: Other: see protocol - 6 weeks - no weight bearing or lifting > 2 pounds; out of elbow brace; active elbow flexion gravity only - no resistive work;  PROM goal full tension free elbow extension at 9 weeks (01/22/23)   WEIGHT BEARING RESTRICTIONS: Yes no weight bearing Rt UE   FALLS:  Has patient fallen in last 6 months? 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 Plans to return to this type work.  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 High-grade near-full thickness nonretracted tear of the distal biceps tendon just proximal to the insertion at the radial tuberosity. Small to moderate volume bicipitoradialis bursitis. 2. Severe tendinosis of the distal triceps tendon without tear. 3. Mild tendinosis at the origin of the common  extensor tendons without tear. 4. Advanced fatty atrophy of the flexor carpi radialis muscle, which could reflect previous trauma or sequela of chronic denervation. 5. Early mild osteoarthritic changes of the elbow joint. No effusion.  PATIENT SURVEYS:  FOTO 4; Goal 51  POSTURE: Patient presents with head forward posture with increased thoracic kyphosis; shoulders rounded and elevated; scapulae abducted and rotated along the thoracic spine; head of the humerus anterior in orientation.   UPPER EXTREMITY ROM: Rt shoulder elevation limited end ranges in brace; Rt elbow limited at 30 degrees in bledso hinged brace locked at 30 deg extension   Active ROM Right eval Right  12/18/22 Right  01/08/23 Right  01/22/23 Left eval  Shoulder flexion       Shoulder extension       Shoulder abduction       Shoulder adduction       Shoulder internal rotation       Shoulder external rotation       Elbow flexion 90 100 122 141 WNL's  Elbow extension -30 -10 0 0 WNL's   Wrist flexion       Wrist extension       Wrist ulnar deviation       Wrist radial deviation       Wrist pronation neutral 65  85   Wrist supination neutral  60  82   (Blank rows = not tested)  UPPER EXTREMITY MMT: strength not assessed resistively   MMT Right eval Left eval  Shoulder flexion    Shoulder extension    Shoulder abduction    Shoulder adduction    Shoulder internal rotation    Shoulder external rotation    Middle trapezius    Lower trapezius    Elbow flexion    Elbow extension    Wrist flexion    Wrist extension    Wrist ulnar deviation    Wrist radial deviation    Wrist pronation    Wrist supination    Grip strength (lbs)    (Blank rows = not tested)  PALPATION:  Muscular tightness Rt upper trap; leveator; pecs; proximal anterior shoulder   TODAY'S [facility] Adult PT   Treatment:                                                DATE: 02/17/23 Therapeutic Exercise: UBE L8 x 4 min alt fwd/back Cable  bilat biceps curl 15# 10 x 2  Cable row bilat 10# 10 x 2  Wall push up x 10 x 2  ER blue TB 3 sec x 10 x 2 IR blue TB 3 sec x 10 x 2 Push/pull sled w/ 25# 40 ft x 5 each  Push pull 25# wt waist height x ~ 5 min  Lifting 25# weight moving side to side x 3 min  Nustep L6 x 6 min Bodyblade flexion 1 min x 2 Rt/Lt  Bodyblade bilat UE's chest to overhead x 1 min x 2  Wt bearing on wobble board with air-ex cushion on top for rocking side 2 min  Overhead carry 5# DB each UE x 4 laps  Farmers Carry 15# Rt/Lt x 4 laps  Wall pushup on air-ex push up/clap x 20 Triceps press up on yoga blocks 3 sec x 10 x 2  Biceps stretch 30 sec x 3  Doorway stretch 30 sec x 3 reps each position Shoulder flexion with trunk rotation blue TB simulation of bowling x 10 x 2 Shoulder extension with trunk rotation blue TB simulation of bowling x 10 x 2 Row row blue TB 3 sec x 10 x 2  Bow and arrow blue TB Rt/Lt 3 sec x 10 x 2  Antirotation blue 3 sec x 10 x 2 Rt/Lt  Shoulder extension red TB 3 sec x 10  Supination/pronation elbow 90 deg flexion red TB - x 10 reps   Plank on low table 30 sec x 1; 45 sec x 2 on forearms Bent row 5# KB x 10 x 2 AAROM Rt elbow into full flexion 10-20 sec hold x 2    Treatment:                                                DATE: 02/12/23 Therapeutic Exercise: UBE L7 x 4 min alt fwd/back Bodyblade flexion 1 min x 2 Rt/Lt  Bodyblade bilat UE's chest to overhead x 2  Wt bearing on wobble board with air-ex cushion on top for rocking side 2 min  Wall pushup on air-ex push up/clap x 20 Triceps press up on yoga blocks 3 sec x 10 x 2  Biceps stretch 30 sec x 3  Doorway stretch 30 sec x 3 reps each position Row row blue TB 3 sec x 10 x 2  Bow and arrow blue TB Rt/Lt 3 sec x 10 x 2  Lat pull blue TB 3 sec x 10 x 2  Shoulder extension red TB 3 sec x 10  ER blue TB 3 sec x 10 x 2 IR blue TB 3 sec x 10 x 2 Biceps curl 5# 10 x 2  Hammer curl 5# 10 x 3  Supination/pronation elbow 90 deg  flexion red TB - x 10 reps   Recumbent bike L3 x 5 min  Plank on low  table 30 sec x 1; 45 sec x 2 on forearms Bent row 5# KB x 10 x 2 Overhead carry 5# DB each UE x 4 laps  Farmers Carry 15# Rt/Lt x 4 laps  Shoulder flexion with trunk rotation green TB simulation of bowling x 10 x 2   Manual Therapy:  Neuromuscular re-ed: Postural correction encouraging patient to engage posterior shoulder girdle and avoid sitting with Rt shoulder depressed   PATIENT EDUCATION: Education details: POC; HEP Person educated: Patient Education method: Programmer, multimedia, Demonstration, Actor cues, Verbal cues, and Handouts Education comprehension: verbalized understanding, returned demonstration, verbal cues required, tactile cues required, and needs further education  HOME EXERCISE PROGRAM: Access Code: [email or web address] URL: [email or web address] Date: 02/05/2023 Prepared by: Corlis Leak  Exercises - Supine Cervical Retraction with Towel  - 2 x daily - 7 x weekly - 1 sets - 5-10 reps - 10 sec  hold - Seated Cervical Retraction  - 2 x daily - 7 x weekly - 1-2 sets - 5-10 reps - 10 sec  hold - Supine Scapular Retraction  - 2 x daily - 7 x weekly - 1 sets - 10 reps - 5-10 sec  hold - Seated Scapular Retraction  - 2 x daily - 7 x weekly - 1-2 sets - 10 reps - 10 sec  hold - Seated Cervical Sidebending AROM  - 2 x daily - 7 x weekly - 1 sets - 5 reps - 5-10 sec  hold - Standing Isometric Shoulder Extension with Doorway - Arm Bent  - 1 x daily - 7 x weekly - 1 sets - 5-10 reps - 5 sec  hold - Standing Isometric Shoulder Abduction with Doorway - Arm Bent  - 1 x daily - 7 x weekly - 1 sets - 5 reps - 5 sec  hold - Standing Isometric Shoulder Adduction with Ball  - 1 x daily - 7 x weekly - 1 sets - 10 reps - 3 sec  hold - Isometric Shoulder External Rotation at Wall  - 1 x daily - 7 x weekly - 1 sets - 10 reps - 3 sec  hold - Standing Shoulder Row Reactive Isometric  - 2 x daily - 7 x weekly - 1 sets - 10  reps - 30-45 sec  hold - Shoulder External Rotation Reactive Isometrics  - 1 x daily - 7 x weekly - 1 sets - 5-10 reps - 10-30 sec  hold - Shoulder Internal Rotation Reactive Isometrics  - 2 x daily - 7 x weekly - 1 sets - 10 reps - 3-5 sec  hold - Shoulder Internal Rotation with Resistance  - 1 x daily - 7 x weekly - 2 sets - 10 reps - 3 sec  hold - Shoulder External Rotation with Anchored Resistance  - 1 x daily - 7 x weekly - 1-2 sets - 10 reps - 3 sec  hold - Wall Push Up  - 1 x daily - 7 x weekly - 1-3 sets - 10 reps - 3 sec  hold - Standing Pronated Elbow Flexion with Dumbbell  - 1 x daily - 7 x weekly - 1-3 sets - 10 reps - 3 sec  hold - Standing Bicep Curls Neutral with Dumbbells  - 1 x daily - 7 x weekly - 1-3 sets - 10 reps - 3 sec  hold - Forearm Pronation and Supination with Hammer  - 1 x daily - 7 x weekly - 1-3 sets - 10 reps - 3 sec  hold - Supine Chest Stretch on Foam Roll  - 2 x daily - 7 x weekly - 1 sets - 1 reps - 2-5 min  sec  hold - Standing Bilateral Low Shoulder Row with Anchored Resistance  - 1 x daily - 7 x weekly - 1-3 sets - 10 reps - 2-3 sec  hold - Drawing Bow  - 1 x daily - 7 x weekly - 1-3 sets - 10 reps - 3 sec  hold - Standing Lat Pull Down with Resistance - Elbows Bent  - 2 x daily - 7 x weekly - 1-3 sets - 10 reps - 3 sec  hold - Standing Bent Over Single Arm Scapular Row with Table Support with PLB  - 2 x daily - 7 x weekly - 1-3 sets - 10 reps - 3 sec  hold  ASSESSMENT:  CLINICAL IMPRESSION:   Patient reports some soreness in Rt arm - biceps and triceps but soreness does not last more than a day or so. Can tell he is making progress and gaining strength. Still does not have full Rt elbow flexion equal to Lt. Continued with UE strengthening adding push/pull and continuing with fast work and explosive wall push up as well as carrying. Working on exercises and using arm for more functional activities. Minimal discomfort in Rt elbow or shoulder with exercises.  Progressed resistive exercises in clinic and for home.   NOTE: Patient reports MD released him to begin strengthening in preparation for RTW and does not plan to see patient again. Released to RTW June.  See job description from work so we can progress strengthening to prepare for RTW.  OBJECTIVE IMPAIRMENTS: Patient is a 57 y.o. male who was seen today for physical therapy evaluation and treatment s/p Rt distal biceps tendon repair 11/20/22. Injury to Rt elbow reported 10/16/22. Decreased mobility, decreased ROM, decreased strength, hypomobility, increased edema, increased fascial restrictions, increased muscle spasms, impaired flexibility, impaired UE functional use, improper body mechanics, postural dysfunction, and pain.   GOALS: Goals reviewed with patient? Yes  SHORT TERM GOALS: Target date: 01/27/2023  Increase ROM Rt elbow flexion/extension to full range Baseline: Goal status: INITIAL  2.  Begin active exercise Rt UE per protocol  Baseline:  Goal status: INITIAL   LONG TERM GOALS: Target date: 02/24/2023   Full pain free ROM Rt shoulder and elbow Baseline:  Goal status: INITIAL  2.  Increase strength Rt UE allowing patient to return to normal functional activities including preparation for RTW  Baseline:  Goal status: INITIAL  3.  Return to bowling as MD approves  Baseline:  Goal status: INITIAL  4.  Patient demonstrates improved posture and alignment with posterior shoulder girdle engaged  Baseline:  Goal status: INITIAL  5.  Independent in HEP including aquatic program as indicated  Baseline:  Goal status: INITIAL  6.  Improve functional limitation score to 51  Baseline: 4 Goal status: INITIAL  PLAN:  PT FREQUENCY: 2x/week  PT DURATION: 12 weeks  PLANNED INTERVENTIONS: Therapeutic exercises, Therapeutic activity, Neuromuscular re-education, Patient/Family education, Self Care, Joint mobilization, Aquatic Therapy, Dry Needling, Electrical stimulation, Spinal  mobilization, Cryotherapy, Moist heat, Taping, Vasopneumatic device, Ultrasound, Ionotophoresis 4mg /ml Dexamethasone, Manual therapy, and Re-evaluation  PLAN FOR NEXT SESSION: review and progress exercises; manual therapy and PROM Rt UE per protocol; manual work; DN; modalities as indicated   [employer], PT 02/17/2023, 11:06 AM

## 2023-02-19 ENCOUNTER — Encounter: Payer: Self-pay | Admitting: Rehabilitative and Restorative Service Providers"

## 2023-02-19 ENCOUNTER — Ambulatory Visit: Payer: 59 | Admitting: Rehabilitative and Restorative Service Providers"

## 2023-02-19 DIAGNOSIS — S46219A Strain of muscle, fascia and tendon of other parts of biceps, unspecified arm, initial encounter: Secondary | ICD-10-CM

## 2023-02-19 DIAGNOSIS — M6281 Muscle weakness (generalized): Secondary | ICD-10-CM

## 2023-02-19 DIAGNOSIS — R29898 Other symptoms and signs involving the musculoskeletal system: Secondary | ICD-10-CM

## 2023-02-19 DIAGNOSIS — R293 Abnormal posture: Secondary | ICD-10-CM

## 2023-02-19 NOTE — Therapy (Signed)
OUTPATIENT PHYSICAL THERAPY SHOULDER TREATMENT   Patient Name: Darren Mcdaniel MRN: [id number] DOB:12/24/1965, 57 y.o., male Today's Date: 02/19/2023  END OF SESSION:  PT End of Session - 02/19/23 1102     Visit Number 16    Number of Visits 24    Date for PT Re-Evaluation 02/24/23    Authorization - Visit Number 16    Authorization - Number of Visits 30    PT Start Time 1057    PT Stop Time 1145    PT Time Calculation (min) 48 min    Activity Tolerance Patient tolerated treatment well             Past Medical History:  Diagnosis Date   Arthritis    COVID-19 12/2019   Past Surgical History:  Procedure Laterality Date   FRACTURE SURGERY     left arm '84   Patient Active Problem List   Diagnosis Date Noted   Injury of right elbow 10/31/2022   Tophaceous gout, 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   Swelling of first metatarsophalangeal (MTP) joint of left foot 12/28/2019   Viral syndrome 11/08/2019   Triceps tendinitis 12/12/2015   Calcific tendinitis of left Quadriceps insertion 03/27/2015   Primary osteoarthritis of right hand 03/27/2015   Hyperlipidemia 03/16/2015   Dupuytren's contracture of right hand 03/10/2015   Flexor carpi radialis tenosynovitis, right 03/10/2015   Annual physical exam 03/10/2015   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    PCP: Dr Rodney Langton  REFERRING PROVIDER: Dr Ramond Marrow  REFERRING DIAG: Rt distal biceps repair   THERAPY DIAG:  Biceps tendon tear  Other symptoms and signs involving the musculoskeletal system  Abnormal posture  Muscle weakness (generalized)  Rationale for Evaluation and Treatment: Rehabilitation  ONSET DATE: injury 10/15/22; surgery 11/20/22  SUBJECTIVE:                                                                                                                                                                                      SUBJECTIVE STATEMENT:  Patient reports that he was sore in both shoulders today.  No pain. He has minimal discomfort in Rt elbow - in biceps and triceps. Shoulder and elbow ROM are doing well. Now using Rt arm for more functional activities.    Surgeon cleared patient to progress with Rt UE strengthening  PERTINENT HISTORY: Patient reports that he injured Rt biceps 10/16/22 while bowling. He had some pain prior to the bowling but then significant increase in pain when bowling. He underwent surgical repair of Rt distal biceps. He has had continued pain since surgery. Fx Lt arm with ORIF ~40 yrs ago; Lt knee tendonitis  PAIN:  Are you having pain? Yes: NPRS scale: 1/10 with movement; 0/10 when at rest  Pain location: inner and outer elbow   Pain description: aching tired  Aggravating factors: movement  Relieving factors: not moving   PRECAUTIONS: Other: see protocol - 6 weeks - no weight bearing or lifting > 2 pounds; out of elbow brace; active elbow flexion gravity only - no resistive work;  PROM goal full tension free elbow extension at 9 weeks (01/22/23)   WEIGHT BEARING RESTRICTIONS: Yes no weight bearing Rt UE   FALLS:  Has patient fallen in last 6 months? 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 Plans to return to this type work.  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 High-grade near-full thickness nonretracted tear of the distal biceps tendon just proximal to the insertion at the radial tuberosity. Small to moderate volume bicipitoradialis bursitis. 2. Severe tendinosis of the distal triceps tendon without tear. 3. Mild tendinosis at the origin of the common extensor  tendons without tear. 4. Advanced fatty atrophy of the flexor carpi radialis muscle, which could reflect previous trauma or sequela of chronic denervation. 5. Early mild osteoarthritic changes of the elbow joint. No effusion.  PATIENT SURVEYS:  FOTO 4; Goal 51  POSTURE: Patient presents with head forward posture with increased thoracic kyphosis; shoulders rounded and elevated; scapulae abducted and rotated along the thoracic spine; head of the humerus anterior in orientation.   UPPER EXTREMITY ROM: Rt shoulder elevation limited end ranges in brace; Rt elbow limited at 30 degrees in bledso hinged brace locked at 30 deg extension   Active ROM Right eval Right  12/18/22 Right  01/08/23 Right  01/22/23 Left eval  Shoulder flexion       Shoulder extension       Shoulder abduction       Shoulder adduction       Shoulder internal rotation       Shoulder external rotation       Elbow flexion 90 100 122 141 WNL's  Elbow extension -30 -10 0 0 WNL's   Wrist flexion       Wrist extension       Wrist ulnar deviation       Wrist radial deviation       Wrist pronation neutral 65  85   Wrist supination neutral  60  82   (Blank rows = not tested)  UPPER EXTREMITY MMT: strength not assessed resistively   MMT Right eval Left eval  Shoulder flexion    Shoulder extension    Shoulder abduction    Shoulder adduction    Shoulder internal rotation    Shoulder external rotation    Middle trapezius    Lower trapezius    Elbow flexion    Elbow extension    Wrist flexion    Wrist extension    Wrist ulnar deviation    Wrist radial deviation    Wrist pronation    Wrist supination    Grip strength (lbs)    (Blank rows = not tested)  PALPATION:  Muscular tightness Rt upper trap; leveator; pecs; proximal anterior shoulder   TODAY'S [facility] Adult PT   Treatment:                                                DATE: 02/19/23 Therapeutic Exercise: UBE L8 x 4 min alt fwd/back Cable bilat biceps  curl 15# 10 x 2  Cable row bilat 10# 10 x 2  Wall push up x 10 x 2  ER blue TB 3 sec x 10 x 2 IR blue TB 3 sec x 10 x 2 Push/pull sled w/ 25# 40 ft x 5 each  Push pull 25# wt waist height x ~ 5 min  Lifting 25# weight moving side to side x 3 min  Nustep L6 x 6 min Wt bearing on wobble board with air-ex cushion on top for rocking side 2 min  Overhead carry 5# DB each UE x 4 laps  Farmers Carry 15# Rt/Lt x 4 laps   Manual: STM/ PROM Lt forearm and elbow PROM into Lt elbow flexion  Treatment:                                                DATE: 02/17/23 Therapeutic Exercise: UBE L8 x 4 min alt fwd/back Cable bilat biceps curl 15# 10 x 2  Cable row bilat 10# 10 x 3  Wall push up x 10 x 3 Supination/pronation elbow 90 deg flexion red TB - x 10 reps x 2 High row blue TB x 10 x 2  High row to ER to overhead red TB x 10 x 2  ER blue TB 3 sec x 10 x 2 IR blue TB 3 sec x 10 x 2 Push/pull sled w/ 25# 40 ft x 5 each  Push pull 25# wt waist height x ~ 5 min  Lifting 25# weight moving side to side x 3 min  Nustep L6 x 6 min Bodyblade flexion 1 min x 2 Rt/Lt  Bodyblade bilat UE's chest to overhead x 1 min x 2  Wt bearing on wobble board with air-ex cushion on top for rocking side 2 min  Overhead carry 5# DB each UE x 4 laps  Farmers Carry 15# Rt/Lt x 4 laps  Wall pushup on air-ex push up/clap x 20 Triceps press up on yoga blocks 3 sec x 10 x 2  Biceps stretch 30 sec x 3  Doorway stretch 30 sec x 3 reps each position Shoulder flexion with trunk rotation blue TB simulation of bowling x 10 x 2 Shoulder extension with trunk rotation blue TB simulation of bowling x 10 x 2 Row row blue TB 3 sec x 10 x 2  Bow and arrow blue TB Rt/Lt 3 sec x 10 x 2  Antirotation blue 3 sec x 10 x 2 Rt/Lt  Shoulder extension red TB 3 sec x 10   Plank on low table 30 sec x 1; 45 sec x 2 on forearms Bent row 5# KB x 10 x 2 AAROM Rt elbow into full flexion 10-20 sec hold x 2   Optional exercises:  Wall pushup  on air-ex push up/clap x 20 Triceps press up on yoga blocks 3 sec x  10 x 2  Biceps stretch 30 sec x 3  Doorway stretch 30 sec x 3 reps each position Shoulder flexion with trunk rotation blue TB simulation of bowling x 10 x 2 Shoulder extension with trunk rotation blue TB simulation of bowling x 10 x 2 Row row blue TB 3 sec x 10 x 2  Bow and arrow blue TB Rt/Lt 3 sec x 10 x 2  Antirotation blue 3 sec x 10 x 2 Rt/Lt  Shoulder extension red TB 3 sec x 10  Supination/pronation elbow 90 deg flexion red TB - x 10 reps   Plank on low table 30 sec x 1; 45 sec x 2 on forearms Bent row 5# KB x 10 x 2 AAROM Rt elbow into full flexion 10-20 sec hold x 2  PATIENT EDUCATION: Education details: POC; HEP Person educated: Patient Education method: Programmer, multimedia, Facilities manager, Actor cues, Verbal cues, and Handouts Education comprehension: verbalized understanding, returned demonstration, verbal cues required, tactile cues required, and needs further education  HOME EXERCISE PROGRAM: Access Code: [email or web address] URL: [email or web address] Date: 02/05/2023 Prepared by: Corlis Leak  Exercises - Supine Cervical Retraction with Towel  - 2 x daily - 7 x weekly - 1 sets - 5-10 reps - 10 sec  hold - Seated Cervical Retraction  - 2 x daily - 7 x weekly - 1-2 sets - 5-10 reps - 10 sec  hold - Supine Scapular Retraction  - 2 x daily - 7 x weekly - 1 sets - 10 reps - 5-10 sec  hold - Seated Scapular Retraction  - 2 x daily - 7 x weekly - 1-2 sets - 10 reps - 10 sec  hold - Seated Cervical Sidebending AROM  - 2 x daily - 7 x weekly - 1 sets - 5 reps - 5-10 sec  hold - Standing Isometric Shoulder Extension with Doorway - Arm Bent  - 1 x daily - 7 x weekly - 1 sets - 5-10 reps - 5 sec  hold - Standing Isometric Shoulder Abduction with Doorway - Arm Bent  - 1 x daily - 7 x weekly - 1 sets - 5 reps - 5 sec  hold - Standing Isometric Shoulder Adduction with Ball  - 1 x daily - 7 x weekly - 1 sets - 10 reps - 3  sec  hold - Isometric Shoulder External Rotation at Wall  - 1 x daily - 7 x weekly - 1 sets - 10 reps - 3 sec  hold - Standing Shoulder Row Reactive Isometric  - 2 x daily - 7 x weekly - 1 sets - 10 reps - 30-45 sec  hold - Shoulder External Rotation Reactive Isometrics  - 1 x daily - 7 x weekly - 1 sets - 5-10 reps - 10-30 sec  hold - Shoulder Internal Rotation Reactive Isometrics  - 2 x daily - 7 x weekly - 1 sets - 10 reps - 3-5 sec  hold - Shoulder Internal Rotation with Resistance  - 1 x daily - 7 x weekly - 2 sets - 10 reps - 3 sec  hold - Shoulder External Rotation with Anchored Resistance  - 1 x daily - 7 x weekly - 1-2 sets - 10 reps - 3 sec  hold - Wall Push Up  - 1 x daily - 7 x weekly - 1-3 sets - 10 reps - 3 sec  hold - Standing Pronated Elbow Flexion with Dumbbell  - 1 x daily - 7 x weekly -  1-3 sets - 10 reps - 3 sec  hold - Standing Bicep Curls Neutral with Dumbbells  - 1 x daily - 7 x weekly - 1-3 sets - 10 reps - 3 sec  hold - Forearm Pronation and Supination with Hammer  - 1 x daily - 7 x weekly - 1-3 sets - 10 reps - 3 sec  hold - Supine Chest Stretch on Foam Roll  - 2 x daily - 7 x weekly - 1 sets - 1 reps - 2-5 min  sec  hold - Standing Bilateral Low Shoulder Row with Anchored Resistance  - 1 x daily - 7 x weekly - 1-3 sets - 10 reps - 2-3 sec  hold - Drawing Bow  - 1 x daily - 7 x weekly - 1-3 sets - 10 reps - 3 sec  hold - Standing Lat Pull Down with Resistance - Elbows Bent  - 2 x daily - 7 x weekly - 1-3 sets - 10 reps - 3 sec  hold - Standing Bent Over Single Arm Scapular Row with Table Support with PLB  - 2 x daily - 7 x weekly - 1-3 sets - 10 reps - 3 sec  hold  ASSESSMENT:  CLINICAL IMPRESSION:   Patient reports some soreness in bilat shoulders and Rt arm - biceps and triceps but soreness does not last more than a day or so. Can tell he is making progress and gaining strength. Still does not have full Rt elbow flexion equal to Lt. Continued with UE strengthening  adding push/pull and continuing with fast work and explosive wall push up as well as carrying. Working on exercises and using arm for more functional activities. Minimal discomfort in Rt elbow or shoulder with exercises. Progressed resistive exercises in clinic and for home.   NOTE: Patient reports MD released him to begin strengthening in preparation for RTW and does not plan to see patient again. Released to RTW June.  See job description from work so we can progress strengthening to prepare for RTW.  OBJECTIVE IMPAIRMENTS: Patient is a 57 y.o. male who was seen today for physical therapy evaluation and treatment s/p Rt distal biceps tendon repair 11/20/22. Injury to Rt elbow reported 10/16/22. Decreased mobility, decreased ROM, decreased strength, hypomobility, increased edema, increased fascial restrictions, increased muscle spasms, impaired flexibility, impaired UE functional use, improper body mechanics, postural dysfunction, and pain.   GOALS: Goals reviewed with patient? Yes  SHORT TERM GOALS: Target date: 01/27/2023  Increase ROM Rt elbow flexion/extension to full range Baseline: Goal status: met  2.  Begin active exercise Rt UE per protocol  Baseline:  Goal status: met   LONG TERM GOALS: Target date: 02/24/2023   Full pain free ROM Rt shoulder and elbow Baseline:  Goal status: on going   2.  Increase strength Rt UE allowing patient to return to normal functional activities including preparation for RTW  Baseline:  Goal status: on going   3.  Return to bowling as MD approves  Baseline:  Goal status: on going   4.  Patient demonstrates improved posture and alignment with posterior shoulder girdle engaged  Baseline:  Goal status: on going   5.  Independent in HEP including aquatic program as indicated  Baseline:  Goal status: on going   6.  Improve functional limitation score to 51  Baseline: 4 Goal status: on going   PLAN:  PT FREQUENCY: 2x/week  PT DURATION: 12  weeks  PLANNED INTERVENTIONS: Therapeutic exercises, Therapeutic activity,  Neuromuscular re-education, Patient/Family education, Self Care, Joint mobilization, Aquatic Therapy, Dry Needling, Electrical stimulation, Spinal mobilization, Cryotherapy, Moist heat, Taping, Vasopneumatic device, Ultrasound, Ionotophoresis 4mg /ml Dexamethasone, Manual therapy, and Re-evaluation  PLAN FOR NEXT SESSION: review and progress exercises; manual therapy and PROM Rt UE per protocol; manual work; DN; modalities as indicated   [employer], PT 02/19/2023, 11:02 AM

## 2023-02-24 ENCOUNTER — Encounter: Payer: Self-pay | Admitting: Rehabilitative and Restorative Service Providers"

## 2023-02-24 ENCOUNTER — Ambulatory Visit: Payer: 59 | Admitting: Rehabilitative and Restorative Service Providers"

## 2023-02-24 DIAGNOSIS — M6281 Muscle weakness (generalized): Secondary | ICD-10-CM

## 2023-02-24 DIAGNOSIS — R29898 Other symptoms and signs involving the musculoskeletal system: Secondary | ICD-10-CM

## 2023-02-24 DIAGNOSIS — S46219A Strain of muscle, fascia and tendon of other parts of biceps, unspecified arm, initial encounter: Secondary | ICD-10-CM

## 2023-02-24 DIAGNOSIS — R293 Abnormal posture: Secondary | ICD-10-CM

## 2023-02-24 NOTE — Therapy (Signed)
OUTPATIENT PHYSICAL THERAPY SHOULDER TREATMENT   Patient Name: Darren Mcdaniel MRN: [id number] DOB:1966-04-16, 57 y.o., male Today's Date: 02/24/2023  END OF SESSION:  PT End of Session - 02/24/23 1102     Visit Number 17    Number of Visits 30    Date for PT Re-Evaluation 04/08/23    Authorization - Visit Number 17    Authorization - Number of Visits 30    PT Start Time 1058    PT Stop Time 1145    PT Time Calculation (min) 47 min    Activity Tolerance Patient tolerated treatment well             Past Medical History:  Diagnosis Date   Arthritis    COVID-19 12/2019   Past Surgical History:  Procedure Laterality Date   FRACTURE SURGERY     left arm '84   Patient Active Problem List   Diagnosis Date Noted   Injury of right elbow 10/31/2022   Tophaceous gout, 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   Swelling of first metatarsophalangeal (MTP) joint of left foot 12/28/2019   Viral syndrome 11/08/2019   Triceps tendinitis 12/12/2015   Calcific tendinitis of left Quadriceps insertion 03/27/2015   Primary osteoarthritis of right hand 03/27/2015   Hyperlipidemia 03/16/2015   Dupuytren's contracture of right hand 03/10/2015   Flexor carpi radialis tenosynovitis, right 03/10/2015   Annual physical exam 03/10/2015   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    PCP: Dr Rodney Langton  REFERRING PROVIDER: Dr Ramond Marrow  REFERRING DIAG: Rt distal biceps repair   THERAPY DIAG:  Biceps tendon tear  Other symptoms and signs involving the musculoskeletal system  Abnormal posture  Muscle weakness (generalized)  Rationale for Evaluation and Treatment: Rehabilitation  ONSET DATE: injury 10/15/22; surgery 11/20/22  SUBJECTIVE:                                                                                                                                                                                      SUBJECTIVE STATEMENT:  Patient reports that he was sore in both shoulders today.  No pain. He has minimal discomfort in Rt elbow - in biceps and triceps. Shoulder and elbow ROM are doing well. Now using Rt arm for more functional activities.    Surgeon cleared patient to progress with Rt UE strengthening  PERTINENT HISTORY: Patient reports that he injured Rt biceps 10/16/22 while bowling. He had some pain prior to the bowling but then significant increase in pain when bowling. He underwent surgical repair of Rt distal biceps. He has had continued pain since surgery. Fx Lt arm with ORIF ~40 yrs ago; Lt knee tendonitis  PAIN:  Are you having pain? Yes: NPRS scale: 1/10 with movement; 0/10 when at rest  Pain location: inner and outer elbow   Pain description: aching tired  Aggravating factors: movement  Relieving factors: not moving   PRECAUTIONS: Other: see protocol - 6 weeks - no weight bearing or lifting > 2 pounds; out of elbow brace; active elbow flexion gravity only - no resistive work;  PROM goal full tension free elbow extension at 9 weeks (01/22/23)   WEIGHT BEARING RESTRICTIONS: Yes no weight bearing Rt UE   FALLS:  Has patient fallen in last 6 months? 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 Plans to return to this type work.  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 High-grade near-full thickness nonretracted tear of the distal biceps tendon just proximal to the insertion at the radial tuberosity. Small to moderate volume bicipitoradialis bursitis. 2. Severe tendinosis of the distal triceps tendon without tear. 3. Mild tendinosis at the origin of the common extensor  tendons without tear. 4. Advanced fatty atrophy of the flexor carpi radialis muscle, which could reflect previous trauma or sequela of chronic denervation. 5. Early mild osteoarthritic changes of the elbow joint. No effusion.  PATIENT SURVEYS:  FOTO 4; Goal 51  POSTURE: Patient presents with head forward posture with increased thoracic kyphosis; shoulders rounded and elevated; scapulae abducted and rotated along the thoracic spine; head of the humerus anterior in orientation.   UPPER EXTREMITY ROM: Rt shoulder elevation limited end ranges in brace; Rt elbow limited at 30 degrees in bledso hinged brace locked at 30 deg extension   Active ROM Right eval Right  12/18/22 Right  01/08/23 Right  01/22/23 Left eval  Shoulder flexion       Shoulder extension       Shoulder abduction       Shoulder adduction       Shoulder internal rotation       Shoulder external rotation       Elbow flexion 90 100 122 141 WNL's  Elbow extension -30 -10 0 0 WNL's   Wrist flexion       Wrist extension       Wrist ulnar deviation       Wrist radial deviation       Wrist pronation neutral 65  85   Wrist supination neutral  60  82   (Blank rows = not tested)  UPPER EXTREMITY MMT: strength not assessed resistively   MMT Right eval Left eval  Shoulder flexion    Shoulder extension    Shoulder abduction    Shoulder adduction    Shoulder internal rotation    Shoulder external rotation    Middle trapezius    Lower trapezius    Elbow flexion    Elbow extension    Wrist flexion    Wrist extension    Wrist ulnar deviation    Wrist radial deviation    Wrist pronation    Wrist supination    Grip strength (lbs)    (Blank rows = not tested)  PALPATION:  Muscular tightness Rt upper trap; leveator; pecs; proximal anterior shoulder   TODAY'S [facility] Adult PT   Treatment:                                                DATE: 02/24/23 Therapeutic Exercise: UBE L 10 x 4 min alt fwd/back Cable bilat  biceps curl 15# 20 x 2  Triceps 10# 20 x 2 Cable row bilat 20# 20 x 2  Rt UE single arm row 10# 20 x 2  Bouncing ball on wall overhead 1 min x 2  Counter plank lower surface 1 min x 2 Nustep L8 x 7 min Overhead carry 5# DB each UE x 6 laps  Farmers Carry 15# Rt/Lt x 5 laps   Manual: STM/ PROM Lt forearm and elbow PROM into Lt elbow flexion  Self care:  Self mobs for Rt elbow flexion   Treatment:                                                DATE: 02/19/23 Therapeutic Exercise: UBE L8 x 4 min alt fwd/back Cable bilat biceps curl 15# 10 x 2  Cable row bilat 10# 10 x 2  Wall push up x 10 x 2  ER blue TB 3 sec x 10 x 2 IR blue TB 3 sec x 10 x 2 Push/pull sled w/ 25# 40 ft x 5 each  Push pull 25# wt waist height x ~ 5 min  Lifting 25# weight moving side to side x 3 min  Nustep L6 x 6 min Wt bearing on wobble board with air-ex cushion on top for rocking side 2 min  Overhead carry 5# DB each UE x 4 laps  Farmers Carry 15# Rt/Lt x 4 laps   Manual: STM/ PROM Lt forearm and elbow PROM into Lt elbow flexion  Therapeutic Exercise: UBE L8 x 4 min alt fwd/back Cable bilat biceps curl 15# 10 x 2  Cable row bilat 10# 10 x 3  Wall push up x 10 x 3 Supination/pronation elbow 90 deg flexion red TB - x 10 reps x 2 High row blue TB x 10 x 2  High row to ER to overhead red TB x 10 x 2  ER blue TB 3 sec x 10 x 2 IR blue TB 3 sec x 10 x 2 Push/pull sled w/ 25# 40 ft x 5 each  Push pull 25# wt waist height x ~ 5 min  Lifting 25# weight moving side to side x 3 min  Nustep L6 x 6 min Bodyblade flexion 1 min x 2 Rt/Lt  Bodyblade bilat UE's chest to overhead x 1 min x 2  Wt bearing on wobble board with air-ex cushion on top for rocking side 2 min  Overhead carry 5# DB each UE x 4 laps  Farmers Carry 15# Rt/Lt x 4 laps  Wall pushup on air-ex push up/clap x 20 Triceps press up on yoga blocks 3 sec x 10 x 2  Biceps stretch 30 sec x 3  Doorway stretch 30 sec x 3 reps each position Shoulder  flexion with trunk rotation blue TB simulation of bowling x 10 x 2 Shoulder extension with trunk rotation  blue TB simulation of bowling x 10 x 2 Row row blue TB 3 sec x 10 x 2  Bow and arrow blue TB Rt/Lt 3 sec x 10 x 2  Antirotation blue 3 sec x 10 x 2 Rt/Lt  Shoulder extension red TB 3 sec x 10   Plank on low table 30 sec x 1; 45 sec x 2 on forearms Bent row 5# KB x 10 x 2 AAROM Rt elbow into full flexion 10-20 sec hold x 2  Wall pushup on air-ex push up/clap x 20 Triceps press up on yoga blocks 3 sec x 10 x 2  Biceps stretch 30 sec x 3  Doorway stretch 30 sec x 3 reps each position Shoulder flexion with trunk rotation blue TB simulation of bowling x 10 x 2 Shoulder extension with trunk rotation blue TB simulation of bowling x 10 x 2 Row row blue TB 3 sec x 10 x 2  Bow and arrow blue TB Rt/Lt 3 sec x 10 x 2  Antirotation blue 3 sec x 10 x 2 Rt/Lt  Shoulder extension red TB 3 sec x 10  Supination/pronation elbow 90 deg flexion red TB - x 10 reps   Plank on low table 30 sec x 1; 45 sec x 2 on forearms Bent row 5# KB x 10 x 2 AAROM Rt elbow into full flexion 10-20 sec hold x 2  PATIENT EDUCATION: Education details: POC; HEP Person educated: Patient Education method: Programmer, multimedia, Facilities manager, Actor cues, Verbal cues, and Handouts Education comprehension: verbalized understanding, returned demonstration, verbal cues required, tactile cues required, and needs further education  HOME EXERCISE PROGRAM: Access Code: [email or web address] URL: [email or web address] Date: 02/05/2023 Prepared by: Corlis Leak  Exercises - Supine Cervical Retraction with Towel  - 2 x daily - 7 x weekly - 1 sets - 5-10 reps - 10 sec  hold - Seated Cervical Retraction  - 2 x daily - 7 x weekly - 1-2 sets - 5-10 reps - 10 sec  hold - Supine Scapular Retraction  - 2 x daily - 7 x weekly - 1 sets - 10 reps - 5-10 sec  hold - Seated Scapular Retraction  - 2 x daily - 7 x weekly - 1-2 sets - 10 reps - 10 sec   hold - Seated Cervical Sidebending AROM  - 2 x daily - 7 x weekly - 1 sets - 5 reps - 5-10 sec  hold - Standing Isometric Shoulder Extension with Doorway - Arm Bent  - 1 x daily - 7 x weekly - 1 sets - 5-10 reps - 5 sec  hold - Standing Isometric Shoulder Abduction with Doorway - Arm Bent  - 1 x daily - 7 x weekly - 1 sets - 5 reps - 5 sec  hold - Standing Isometric Shoulder Adduction with Ball  - 1 x daily - 7 x weekly - 1 sets - 10 reps - 3 sec  hold - Isometric Shoulder External Rotation at Wall  - 1 x daily - 7 x weekly - 1 sets - 10 reps - 3 sec  hold - Standing Shoulder Row Reactive Isometric  - 2 x daily - 7 x weekly - 1 sets - 10 reps - 30-45 sec  hold - Shoulder External Rotation Reactive Isometrics  - 1 x daily - 7 x weekly - 1 sets - 5-10 reps - 10-30 sec  hold - Shoulder Internal Rotation Reactive Isometrics  - 2 x daily - 7 x weekly -  1 sets - 10 reps - 3-5 sec  hold - Shoulder Internal Rotation with Resistance  - 1 x daily - 7 x weekly - 2 sets - 10 reps - 3 sec  hold - Shoulder External Rotation with Anchored Resistance  - 1 x daily - 7 x weekly - 1-2 sets - 10 reps - 3 sec  hold - Wall Push Up  - 1 x daily - 7 x weekly - 1-3 sets - 10 reps - 3 sec  hold - Standing Pronated Elbow Flexion with Dumbbell  - 1 x daily - 7 x weekly - 1-3 sets - 10 reps - 3 sec  hold - Standing Bicep Curls Neutral with Dumbbells  - 1 x daily - 7 x weekly - 1-3 sets - 10 reps - 3 sec  hold - Forearm Pronation and Supination with Hammer  - 1 x daily - 7 x weekly - 1-3 sets - 10 reps - 3 sec  hold - Supine Chest Stretch on Foam Roll  - 2 x daily - 7 x weekly - 1 sets - 1 reps - 2-5 min  sec  hold - Standing Bilateral Low Shoulder Row with Anchored Resistance  - 1 x daily - 7 x weekly - 1-3 sets - 10 reps - 2-3 sec  hold - Drawing Bow  - 1 x daily - 7 x weekly - 1-3 sets - 10 reps - 3 sec  hold - Standing Lat Pull Down with Resistance - Elbows Bent  - 2 x daily - 7 x weekly - 1-3 sets - 10 reps - 3 sec   hold - Standing Bent Over Single Arm Scapular Row with Table Support with PLB  - 2 x daily - 7 x weekly - 1-3 sets - 10 reps - 3 sec  hold  ASSESSMENT:  CLINICAL IMPRESSION:   Patient reports some soreness in bilat shoulders and Rt arm - biceps and triceps, but soreness does not last more than a day or so. Can tell he is making progress and gaining strength. Still does not have full Rt elbow flexion equal to Lt. Continued with UE strengthening adding push/pull and continuing with fast work and explosive wall push up as well as carrying. Working on exercises and using arm for more functional activities. Minimal discomfort in Rt elbow or shoulder with exercises. Progressed resistive exercises in clinic and for home to prepare for RTW   NOTE: Patient reports MD released him to begin strengthening in preparation for RTW and does not plan to see patient again. Released to RTW June.  See job description from work so we can progress strengthening to prepare for RTW.  OBJECTIVE IMPAIRMENTS: Patient is a 57 y.o. male who was seen today for physical therapy evaluation and treatment s/p Rt distal biceps tendon repair 11/20/22. Injury to Rt elbow reported 10/16/22. Decreased mobility, decreased ROM, decreased strength, hypomobility, increased edema, increased fascial restrictions, increased muscle spasms, impaired flexibility, impaired UE functional use, improper body mechanics, postural dysfunction, and pain.   GOALS: Goals reviewed with patient? Yes  SHORT TERM GOALS: Target date: 01/27/2023  Increase ROM Rt elbow flexion/extension to full range Baseline: Goal status: met  2.  Begin active exercise Rt UE per protocol  Baseline:  Goal status: met   LONG TERM GOALS: Target date: 04/08/2023   Full pain free ROM Rt shoulder and elbow Baseline:  Goal status: on going   2.  Increase strength Rt UE allowing patient to return to normal  functional activities including preparation for RTW  Baseline:  Goal  status: on going   3.  Return to bowling as MD approves  Baseline:  Goal status: on going   4.  Patient demonstrates improved posture and alignment with posterior shoulder girdle engaged  Baseline:  Goal status: on going   5.  Independent in HEP including aquatic program as indicated  Baseline:  Goal status: on going   6.  Improve functional limitation score to 51  Baseline: 4 Goal status: on going   PLAN:  PT FREQUENCY: 2x/week  PT DURATION: 12 weeks  PLANNED INTERVENTIONS: Therapeutic exercises, Therapeutic activity, Neuromuscular re-education, Patient/Family education, Self Care, Joint mobilization, Aquatic Therapy, Dry Needling, Electrical stimulation, Spinal mobilization, Cryotherapy, Moist heat, Taping, Vasopneumatic device, Ultrasound, Ionotophoresis 4mg /ml Dexamethasone, Manual therapy, and Re-evaluation  PLAN FOR NEXT SESSION: review and progress exercises; manual therapy and PROM Rt UE per protocol; manual work; DN; modalities as indicated   [employer], PT 02/24/2023, 11:19 AM

## 2023-02-26 ENCOUNTER — Encounter: Payer: Self-pay | Admitting: Rehabilitative and Restorative Service Providers"

## 2023-02-26 ENCOUNTER — Ambulatory Visit: Payer: 59 | Admitting: Rehabilitative and Restorative Service Providers"

## 2023-02-26 DIAGNOSIS — R293 Abnormal posture: Secondary | ICD-10-CM

## 2023-02-26 DIAGNOSIS — R29898 Other symptoms and signs involving the musculoskeletal system: Secondary | ICD-10-CM

## 2023-02-26 DIAGNOSIS — S46219A Strain of muscle, fascia and tendon of other parts of biceps, unspecified arm, initial encounter: Secondary | ICD-10-CM | POA: Diagnosis not present

## 2023-02-26 DIAGNOSIS — M6281 Muscle weakness (generalized): Secondary | ICD-10-CM

## 2023-02-26 NOTE — Therapy (Signed)
OUTPATIENT PHYSICAL THERAPY SHOULDER TREATMENT   Patient Name: Darren Mcdaniel MRN: [id number] DOB:1966-02-12, 57 y.o., male Today's Date: 02/26/2023  END OF SESSION:  PT End of Session - 02/26/23 1112     Visit Number 18    Number of Visits 30    Date for PT Re-Evaluation 04/08/23    Authorization - Visit Number 18    Authorization - Number of Visits 30    PT Start Time 1100    PT Stop Time 1145    PT Time Calculation (min) 45 min    Activity Tolerance Patient tolerated treatment well             Past Medical History:  Diagnosis Date   Arthritis    COVID-19 12/2019   Past Surgical History:  Procedure Laterality Date   FRACTURE SURGERY     left arm '84   Patient Active Problem List   Diagnosis Date Noted   Injury of right elbow 10/31/2022   Tophaceous gout, 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   Swelling of first metatarsophalangeal (MTP) joint of left foot 12/28/2019   Viral syndrome 11/08/2019   Triceps tendinitis 12/12/2015   Calcific tendinitis of left Quadriceps insertion 03/27/2015   Primary osteoarthritis of right hand 03/27/2015   Hyperlipidemia 03/16/2015   Dupuytren's contracture of right hand 03/10/2015   Flexor carpi radialis tenosynovitis, right 03/10/2015   Annual physical exam 03/10/2015   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    PCP: Dr Rodney Langton  REFERRING PROVIDER: Dr Ramond Marrow  REFERRING DIAG: Rt distal biceps repair   THERAPY DIAG:  Biceps tendon tear  Other symptoms and signs involving the musculoskeletal system  Abnormal posture  Muscle weakness (generalized)  Rationale for Evaluation and Treatment: Rehabilitation  ONSET DATE: injury 10/15/22; surgery 11/20/22  SUBJECTIVE:                                                                                                                                                                                      SUBJECTIVE STATEMENT:  Patient reports soreness in both shoulders today. No pain.  He has minimal discomfort in Rt elbow - in biceps and triceps. Shoulder and elbow ROM are doing well. Now using Rt arm for more functional activities.    Surgeon cleared patient to progress with Rt UE strengthening  PERTINENT HISTORY: Patient reports that he injured Rt biceps 10/16/22 while bowling. He had some pain prior to the bowling but then significant increase in pain when bowling. He underwent surgical repair of Rt distal biceps. He has had continued pain since surgery. Fx Lt arm with ORIF ~40 yrs ago; Lt knee tendonitis  PAIN:  Are you having  pain? Yes: NPRS scale: 3/10 with movement; 0/10 when at rest  Pain location: biceps and triceps   Pain description: soreness  Aggravating factors: movement  Relieving factors: not moving   PRECAUTIONS: Other: see protocol - 6 weeks - no weight bearing or lifting > 2 pounds; out of elbow brace; active elbow flexion gravity only - no resistive work;  PROM goal full tension free elbow extension at 9 weeks (01/22/23)   WEIGHT BEARING RESTRICTIONS: Yes no weight bearing Rt UE   FALLS:  Has patient fallen in last 6 months? 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 Plans to return to this type work.  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 High-grade near-full thickness nonretracted tear of the distal biceps tendon just proximal to the insertion at the radial tuberosity. Small to moderate volume bicipitoradialis bursitis. 2. Severe tendinosis of the distal triceps tendon without tear. 3. Mild tendinosis at the origin of the common extensor  tendons without tear. 4. Advanced fatty atrophy of the flexor carpi radialis muscle, which could reflect previous trauma or sequela of chronic denervation. 5. Early mild osteoarthritic changes of the elbow joint. No effusion.  PATIENT SURVEYS:  FOTO 4; Goal 51  POSTURE: Patient presents with head forward posture with increased thoracic kyphosis; shoulders rounded and elevated; scapulae abducted and rotated along the thoracic spine; head of the humerus anterior in orientation.   UPPER EXTREMITY ROM: Rt shoulder elevation limited end ranges in brace; Rt elbow limited at 30 degrees in bledso hinged brace locked at 30 deg extension   Active ROM Right eval Right  12/18/22 Right  01/08/23 Right  01/22/23 Left eval  Shoulder flexion       Shoulder extension       Shoulder abduction       Shoulder adduction       Shoulder internal rotation       Shoulder external rotation       Elbow flexion 90 100 122 141 WNL's  Elbow extension -30 -10 0 0 WNL's   Wrist flexion       Wrist extension       Wrist ulnar deviation       Wrist radial deviation       Wrist pronation neutral 65  85   Wrist supination neutral  60  82   (Blank rows = not tested)  UPPER EXTREMITY MMT: strength not assessed resistively   MMT Right eval Left eval  Shoulder flexion    Shoulder extension    Shoulder abduction    Shoulder adduction    Shoulder internal rotation    Shoulder external rotation    Middle trapezius    Lower trapezius    Elbow flexion    Elbow extension    Wrist flexion    Wrist extension    Wrist ulnar deviation    Wrist radial deviation    Wrist pronation    Wrist supination    Grip strength (lbs)    (Blank rows = not tested)  PALPATION:  Muscular tightness  Rt upper trap; leveator; pecs; proximal anterior shoulder   TODAY'S [facility] Adult PT   Treatment:                                                DATE: 02/26/23 Therapeutic Exercise: UBE L 10 x 4 min alt fwd/back Cable bilat  biceps curl 15# 10 x 2  Cable triceps 12.5# 15 x 3 Cable row bilat 20# 15 x 3  Rt UE single arm row 10# 15 x 3 Rt; x 2 Lt   Nustep L8 x 10 min Body blade 3 min Rt/Lt flexion chest level; bilat overhead Overhead carry 5# DB each UE x 7 laps  Farmers Carry 15# Rt/Lt x 7 laps   Manual: STM/ PROM Lt forearm and elbow PROM into Lt elbow flexion/extension  Self care:  Self mobs for Rt elbow flexion   Treatment:                                                DATE: 02/24/23 Therapeutic Exercise: UBE L 10 x 4 min alt fwd/back Cable bilat biceps curl 15# 20 x 2  Triceps 10# 20 x 2 Cable row bilat 20# 20 x 2  Rt UE single arm row 10# 20 x 2  Bouncing ball on wall overhead 1 min x 2  Counter plank lower surface 1 min x 2 Nustep L8 x 7 min Overhead carry 5# DB each UE x 6 laps  Farmers Carry 15# Rt/Lt x 5 laps   Manual: STM/ PROM Lt forearm and elbow PROM into Lt elbow flexion  Self care:  Self mobs for Rt elbow flexion   Therapeutic Exercises: UBE L8 x 4 min alt fwd/back Cable bilat biceps curl 15# 10 x 2  Cable row bilat 10# 10 x 3  Wall push up x 10 x 3 Supination/pronation elbow 90 deg flexion red TB - x 10 reps x 2 High row blue TB x 10 x 2  High row to ER to overhead red TB x 10 x 2  ER blue TB 3 sec x 10 x 2 IR blue TB 3 sec x 10 x 2 Push/pull sled w/ 25# 40 ft x 5 each  Push pull 25# wt waist height x ~ 5 min  Lifting 25# weight moving side to side x 3 min  Nustep L6 x 6 min Bodyblade flexion 1 min x 2 Rt/Lt  Bodyblade bilat UE's chest to overhead x 1 min x 2  Wt bearing on wobble board with air-ex cushion on top for rocking side 2 min  Overhead carry 5# DB each UE x 4 laps  Farmers Carry 15# Rt/Lt x 4 laps  Wall pushup on air-ex push up/clap x 20 Triceps press up on yoga blocks 3 sec x 10 x 2  Biceps stretch 30 sec x 3  Doorway stretch 30 sec x 3 reps each position Shoulder flexion with trunk rotation blue TB simulation of bowling x 10 x 2 Shoulder extension  with trunk rotation blue TB simulation of bowling x 10 x 2 Row row blue TB 3 sec x 10 x 2  Bow and arrow blue TB Rt/Lt 3 sec x 10 x 2  Antirotation blue  3 sec x 10 x 2 Rt/Lt  Shoulder extension red TB 3 sec x 10   Plank on low table 30 sec x 1; 45 sec x 2 on forearms Bent row 5# KB x 10 x 2 AAROM Rt elbow into full flexion 10-20 sec hold x 2  Wall pushup on air-ex push up/clap x 20 Triceps press up on yoga blocks 3 sec x 10 x 2  Biceps stretch 30 sec x 3  Doorway stretch 30 sec x 3 reps each position Shoulder flexion with trunk rotation blue TB simulation of bowling x 10 x 2 Shoulder extension with trunk rotation blue TB simulation of bowling x 10 x 2 Row row blue TB 3 sec x 10 x 2  Bow and arrow blue TB Rt/Lt 3 sec x 10 x 2  Antirotation blue 3 sec x 10 x 2 Rt/Lt  Shoulder extension red TB 3 sec x 10  Supination/pronation elbow 90 deg flexion red TB - x 10 reps   Plank on low table 30 sec x 1; 45 sec x 2 on forearms Bent row 5# KB x 10 x 2 AAROM Rt elbow into full flexion 10-20 sec hold x 2   PATIENT EDUCATION: Education details: POC; HEP Person educated: Patient Education method: Programmer, multimedia, Facilities manager, Actor cues, Verbal cues, and Handouts Education comprehension: verbalized understanding, returned demonstration, verbal cues required, tactile cues required, and needs further education  HOME EXERCISE PROGRAM: Access Code: [email or web address] URL: [email or web address] Date: 02/05/2023 Prepared by: Corlis Leak  Exercises - Supine Cervical Retraction with Towel  - 2 x daily - 7 x weekly - 1 sets - 5-10 reps - 10 sec  hold - Seated Cervical Retraction  - 2 x daily - 7 x weekly - 1-2 sets - 5-10 reps - 10 sec  hold - Supine Scapular Retraction  - 2 x daily - 7 x weekly - 1 sets - 10 reps - 5-10 sec  hold - Seated Scapular Retraction  - 2 x daily - 7 x weekly - 1-2 sets - 10 reps - 10 sec  hold - Seated Cervical Sidebending AROM  - 2 x daily - 7 x weekly - 1 sets - 5 reps  - 5-10 sec  hold - Standing Isometric Shoulder Extension with Doorway - Arm Bent  - 1 x daily - 7 x weekly - 1 sets - 5-10 reps - 5 sec  hold - Standing Isometric Shoulder Abduction with Doorway - Arm Bent  - 1 x daily - 7 x weekly - 1 sets - 5 reps - 5 sec  hold - Standing Isometric Shoulder Adduction with Ball  - 1 x daily - 7 x weekly - 1 sets - 10 reps - 3 sec  hold - Isometric Shoulder External Rotation at Wall  - 1 x daily - 7 x weekly - 1 sets - 10 reps - 3 sec  hold - Standing Shoulder Row Reactive Isometric  - 2 x daily - 7 x weekly - 1 sets - 10 reps - 30-45 sec  hold - Shoulder External Rotation Reactive Isometrics  - 1 x daily - 7 x weekly - 1 sets - 5-10 reps - 10-30 sec  hold - Shoulder Internal Rotation Reactive Isometrics  - 2 x daily - 7 x weekly - 1 sets - 10 reps - 3-5 sec  hold - Shoulder Internal Rotation with Resistance  - 1 x daily - 7 x weekly - 2 sets - 10 reps - 3 sec  hold - Shoulder External Rotation with Anchored Resistance  - 1 x daily - 7 x weekly - 1-2 sets - 10 reps - 3 sec  hold - Wall Push Up  - 1 x daily - 7 x weekly - 1-3 sets - 10 reps - 3 sec  hold - Standing Pronated Elbow Flexion with Dumbbell  - 1 x daily - 7 x weekly - 1-3 sets - 10 reps - 3 sec  hold - Standing Bicep Curls Neutral with Dumbbells  - 1 x daily - 7 x weekly - 1-3 sets - 10 reps - 3 sec  hold - Forearm Pronation and Supination with Hammer  - 1 x daily - 7 x weekly - 1-3 sets - 10 reps - 3 sec  hold - Supine Chest Stretch on Foam Roll  - 2 x daily - 7 x weekly - 1 sets - 1 reps - 2-5 min  sec  hold - Standing Bilateral Low Shoulder Row with Anchored Resistance  - 1 x daily - 7 x weekly - 1-3 sets - 10 reps - 2-3 sec  hold - Drawing Bow  - 1 x daily - 7 x weekly - 1-3 sets - 10 reps - 3 sec  hold - Standing Lat Pull Down with Resistance - Elbows Bent  - 2 x daily - 7 x weekly - 1-3 sets - 10 reps - 3 sec  hold - Standing Bent Over Single Arm Scapular Row with Table Support with PLB  - 2 x daily  - 7 x weekly - 1-3 sets - 10 reps - 3 sec  hold  ASSESSMENT:  CLINICAL IMPRESSION:   Patient reports some soreness in bilat shoulders and Rt arm - biceps and triceps, but soreness does not last more than a day or so. Can tell he is making progress and gaining strength. Still does not have full Rt elbow flexion equal to Lt. Continued with UE strengthening. Working on exercises and using arm for more functional activities. Minimal discomfort in Rt elbow or shoulder with exercises. Progressed resistive exercises in clinic and for home to prepare for RTW   NOTE: Patient reports MD released him to begin strengthening in preparation for RTW and does not plan to see patient again. Released to RTW June.  See job description from work so we can progress strengthening to prepare for RTW.  OBJECTIVE IMPAIRMENTS: Patient is a 57 y.o. male who was seen today for physical therapy evaluation and treatment s/p Rt distal biceps tendon repair 11/20/22. Injury to Rt elbow reported 10/16/22. Decreased mobility, decreased ROM, decreased strength, hypomobility, increased edema, increased fascial restrictions, increased muscle spasms, impaired flexibility, impaired UE functional use, improper body mechanics, postural dysfunction, and pain.   GOALS: Goals reviewed with patient? Yes  SHORT TERM GOALS: Target date: 01/27/2023  Increase ROM Rt elbow flexion/extension to full range Baseline: Goal status: met  2.  Begin active exercise Rt UE per protocol  Baseline:  Goal status: met   LONG TERM GOALS: Target date: 04/08/2023   Full pain free ROM Rt shoulder and elbow Baseline:  Goal status: on going   2.  Increase strength Rt UE allowing patient to return to normal functional activities including preparation for RTW  Baseline:  Goal status: on going   3.  Return to bowling as MD approves  Baseline:  Goal status: on going   4.  Patient demonstrates improved posture and alignment with posterior shoulder girdle  engaged  Baseline:  Goal status:  on going   5.  Independent in HEP including aquatic program as indicated  Baseline:  Goal status: on going   6.  Improve functional limitation score to 51  Baseline: 4 Goal status: on going   PLAN:  PT FREQUENCY: 2x/week  PT DURATION: 12 weeks  PLANNED INTERVENTIONS: Therapeutic exercises, Therapeutic activity, Neuromuscular re-education, Patient/Family education, Self Care, Joint mobilization, Aquatic Therapy, Dry Needling, Electrical stimulation, Spinal mobilization, Cryotherapy, Moist heat, Taping, Vasopneumatic device, Ultrasound, Ionotophoresis 4mg /ml Dexamethasone, Manual therapy, and Re-evaluation  PLAN FOR NEXT SESSION: review and progress exercises; manual therapy and PROM Rt UE per protocol; manual work; DN; modalities as indicated   [employer], PT 02/26/2023, 11:13 AM

## 2023-03-03 ENCOUNTER — Encounter: Payer: Self-pay | Admitting: Rehabilitative and Restorative Service Providers"

## 2023-03-03 ENCOUNTER — Ambulatory Visit: Payer: 59 | Admitting: Rehabilitative and Restorative Service Providers"

## 2023-03-03 DIAGNOSIS — R293 Abnormal posture: Secondary | ICD-10-CM

## 2023-03-03 DIAGNOSIS — M6281 Muscle weakness (generalized): Secondary | ICD-10-CM

## 2023-03-03 DIAGNOSIS — R29898 Other symptoms and signs involving the musculoskeletal system: Secondary | ICD-10-CM

## 2023-03-03 DIAGNOSIS — S46219A Strain of muscle, fascia and tendon of other parts of biceps, unspecified arm, initial encounter: Secondary | ICD-10-CM | POA: Diagnosis not present

## 2023-03-03 NOTE — Therapy (Signed)
OUTPATIENT PHYSICAL THERAPY SHOULDER TREATMENT   Patient Name: Darren Mcdaniel MRN: [id number] DOB:1966-08-10, 57 y.o., male Today's Date: 03/03/2023  END OF SESSION:  PT End of Session - 03/03/23 1101     Visit Number 19    Number of Visits 30    Date for PT Re-Evaluation 04/08/23    Authorization - Visit Number 19    Authorization - Number of Visits 30    PT Start Time 1100    PT Stop Time 1148    PT Time Calculation (min) 48 min    Activity Tolerance Patient tolerated treatment well             Past Medical History:  Diagnosis Date   Arthritis    COVID-19 12/2019   Past Surgical History:  Procedure Laterality Date   FRACTURE SURGERY     left arm '84   Patient Active Problem List   Diagnosis Date Noted   Injury of right elbow 10/31/2022   Tophaceous gout, 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   Swelling of first metatarsophalangeal (MTP) joint of left foot 12/28/2019   Viral syndrome 11/08/2019   Triceps tendinitis 12/12/2015   Calcific tendinitis of left Quadriceps insertion 03/27/2015   Primary osteoarthritis of right hand 03/27/2015   Hyperlipidemia 03/16/2015   Dupuytren's contracture of right hand 03/10/2015   Flexor carpi radialis tenosynovitis, right 03/10/2015   Annual physical exam 03/10/2015   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    PCP: Dr Rodney Langton  REFERRING PROVIDER: Dr Ramond Marrow  REFERRING DIAG: Rt distal biceps repair   THERAPY DIAG:  Biceps tendon tear  Other symptoms and signs involving the musculoskeletal system  Abnormal posture  Muscle weakness (generalized)  Rationale for Evaluation and Treatment: Rehabilitation  ONSET DATE: injury 10/15/22; surgery 11/20/22  SUBJECTIVE:                                                                                                                                                                                      SUBJECTIVE STATEMENT:  Patient reports that he mowed Saturday and was sore  yesterday. He picked up his bowling ball last week. He has carried it around but has not tried bowling. He has minimal discomfort in Rt elbow - in biceps and triceps. Shoulder and elbow ROM are doing well. Now using Rt arm for more functional activities. Can tell he is getting stronger.   Surgeon cleared patient to progress with Rt UE strengthening  PERTINENT HISTORY: Patient reports that he injured Rt biceps 10/16/22 while bowling. He had some pain prior to the bowling but then significant increase in pain when bowling. He underwent surgical repair of Rt distal biceps.  He has had continued pain since surgery. Fx Lt arm with ORIF ~40 yrs ago; Lt knee tendonitis  PAIN:  Are you having pain? Yes: NPRS scale: 2/10 with movement; 0/10 when at rest  Pain location: biceps and triceps   Pain description: soreness  Aggravating factors: movement  Relieving factors: not moving   PRECAUTIONS: Other: see protocol - 6 weeks - no weight bearing or lifting > 2 pounds; out of elbow brace; active elbow flexion gravity only - no resistive work;  PROM goal full tension free elbow extension at 9 weeks (01/22/23)   WEIGHT BEARING RESTRICTIONS: Yes no weight bearing Rt UE   FALLS:  Has patient fallen in last 6 months? 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 Plans to return to this type work.  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 High-grade near-full thickness nonretracted tear of the distal biceps tendon just proximal to the insertion at the radial tuberosity. Small to moderate volume bicipitoradialis  bursitis. 2. Severe tendinosis of the distal triceps tendon without tear. 3. Mild tendinosis at the origin of the common extensor tendons without tear. 4. Advanced fatty atrophy of the flexor carpi radialis muscle, which could reflect previous trauma or sequela of chronic denervation. 5. Early mild osteoarthritic changes of the elbow joint. No effusion.  PATIENT SURVEYS:  FOTO 4; Goal 51  POSTURE: Patient presents with head forward posture with increased thoracic kyphosis; shoulders rounded and elevated; scapulae abducted and rotated along the thoracic spine; head of the humerus anterior in orientation.   UPPER EXTREMITY ROM: Rt shoulder elevation limited end ranges in brace; Rt elbow limited at 30 degrees in bledso hinged brace locked at 30 deg extension   Active ROM Right eval Right  12/18/22 Right  01/08/23 Right  01/22/23 Left eval  Shoulder flexion       Shoulder extension       Shoulder abduction       Shoulder adduction       Shoulder internal rotation       Shoulder external rotation       Elbow flexion 90 100 122 141 WNL's  Elbow extension -30 -10 0 0 WNL's   Wrist flexion       Wrist extension       Wrist ulnar deviation       Wrist radial deviation       Wrist pronation neutral 65  85   Wrist supination neutral  60  82   (Blank rows = not tested)  UPPER EXTREMITY MMT: strength not assessed resistively   MMT Right eval Left eval  Shoulder flexion    Shoulder extension    Shoulder abduction    Shoulder adduction    Shoulder internal rotation    Shoulder external rotation    Middle trapezius    Lower trapezius    Elbow flexion    Elbow extension    Wrist flexion    Wrist extension    Wrist ulnar deviation    Wrist radial deviation    Wrist pronation  Wrist supination    Grip strength (lbs)    (Blank rows = not tested)  PALPATION:  Muscular tightness Rt upper trap; leveator; pecs; proximal anterior shoulder   TODAY'S [facility] Adult PT   Treatment:                                                 DATE: 03/03/23 Therapeutic Exercise: UBE L 10 x 4 min alt fwd/back Doorway stretch 30 sec x 3 each position Shoulder flexion over door 30 sec x 2 Cable bilat biceps curl 15# 20 x 1;15 x 1  Cable triceps split cable 10# 20 x 1; 15 x 1  Cable triceps bar 12.5# 15 x 3 Cable row bilat 20# 20 x 2  Rt UE single arm row 10# 15 x 3 Rt; x 2 Lt   Nustep L9 x 5 min Body blade 3 min Rt/Lt flexion chest level; bilat overhead Overhead carry 5# DB each UE x 8 laps  Farmers Carry 15# Rt/Lt x 8 laps   Manual: STM/ PROM Lt forearm and elbow PROM into Lt elbow flexion/extension Passive stretch for elbow flexion with shoulder extension   Self care:  Self mobs for Rt elbow flexion   Treatment:                                                DATE: 02/26/23 Therapeutic Exercise: UBE L 10 x 4 min alt fwd/back Cable bilat biceps curl 15# 10 x 2  Cable triceps 12.5# 15 x 3 Cable row bilat 20# 15 x 3  Rt UE single arm row 10# 15 x 3 Rt; x 2 Lt   Nustep L8 x 10 min Body blade 3 min Rt/Lt flexion chest level; bilat overhead Overhead carry 5# DB each UE x 7 laps  Farmers Carry 15# Rt/Lt x 7 laps   Manual: STM/ PROM Lt forearm and elbow PROM into Lt elbow flexion/extension  Self care:  Self mobs for Rt elbow flexion   Therapeutic Exercises: UBE L8 x 4 min alt fwd/back Cable bilat biceps curl 15# 10 x 2  Cable row bilat 10# 10 x 3  Wall push up x 10 x 3 Supination/pronation elbow 90 deg flexion red TB - x 10 reps x 2 High row blue TB x 10 x 2  High row to ER to overhead red TB x 10 x 2  ER blue TB 3 sec x 10 x 2 IR blue TB 3 sec x 10 x 2 Push/pull sled w/ 25# 40 ft x 5 each  Push pull 25# wt waist height x ~ 5 min  Lifting 25# weight moving side to side x 3 min  Nustep L6 x 6 min Bodyblade flexion 1 min x 2 Rt/Lt  Bodyblade bilat UE's chest to overhead x 1 min x 2  Wt bearing on wobble board with air-ex cushion on top for rocking side 2 min   Overhead carry 5# DB each UE x 4 laps  Farmers Carry 15# Rt/Lt x 4 laps  Wall pushup on air-ex push up/clap x 20 Triceps press up on yoga blocks 3 sec x 10 x 2  Biceps stretch 30 sec x 3  Doorway stretch 30 sec  x 3 reps each position Shoulder flexion with trunk rotation blue TB simulation of bowling x 10 x 2 Shoulder extension with trunk rotation blue TB simulation of bowling x 10 x 2 Row row blue TB 3 sec x 10 x 2  Bow and arrow blue TB Rt/Lt 3 sec x 10 x 2  Antirotation blue 3 sec x 10 x 2 Rt/Lt  Shoulder extension red TB 3 sec x 10   Plank on low table 30 sec x 1; 45 sec x 2 on forearms Bent row 5# KB x 10 x 2 AAROM Rt elbow into full flexion 10-20 sec hold x 2  Wall pushup on air-ex push up/clap x 20 Triceps press up on yoga blocks 3 sec x 10 x 2  Biceps stretch 30 sec x 3  Doorway stretch 30 sec x 3 reps each position Shoulder flexion with trunk rotation blue TB simulation of bowling x 10 x 2 Shoulder extension with trunk rotation blue TB simulation of bowling x 10 x 2 Row row blue TB 3 sec x 10 x 2  Bow and arrow blue TB Rt/Lt 3 sec x 10 x 2  Antirotation blue 3 sec x 10 x 2 Rt/Lt  Shoulder extension red TB 3 sec x 10  Supination/pronation elbow 90 deg flexion red TB - x 10 reps   Plank on low table 30 sec x 1; 45 sec x 2 on forearms Bent row 5# KB x 10 x 2 AAROM Rt elbow into full flexion 10-20 sec hold x 2   PATIENT EDUCATION: Education details: POC; HEP Person educated: Patient Education method: Programmer, multimedia, Facilities manager, Actor cues, Verbal cues, and Handouts Education comprehension: verbalized understanding, returned demonstration, verbal cues required, tactile cues required, and needs further education  HOME EXERCISE PROGRAM: Access Code: [email or web address] URL: [email or web address] Date: 02/05/2023 Prepared by: Corlis Leak  Exercises - Supine Cervical Retraction with Towel  - 2 x daily - 7 x weekly - 1 sets - 5-10 reps - 10 sec  hold - Seated Cervical  Retraction  - 2 x daily - 7 x weekly - 1-2 sets - 5-10 reps - 10 sec  hold - Supine Scapular Retraction  - 2 x daily - 7 x weekly - 1 sets - 10 reps - 5-10 sec  hold - Seated Scapular Retraction  - 2 x daily - 7 x weekly - 1-2 sets - 10 reps - 10 sec  hold - Seated Cervical Sidebending AROM  - 2 x daily - 7 x weekly - 1 sets - 5 reps - 5-10 sec  hold - Standing Isometric Shoulder Extension with Doorway - Arm Bent  - 1 x daily - 7 x weekly - 1 sets - 5-10 reps - 5 sec  hold - Standing Isometric Shoulder Abduction with Doorway - Arm Bent  - 1 x daily - 7 x weekly - 1 sets - 5 reps - 5 sec  hold - Standing Isometric Shoulder Adduction with Ball  - 1 x daily - 7 x weekly - 1 sets - 10 reps - 3 sec  hold - Isometric Shoulder External Rotation at Wall  - 1 x daily - 7 x weekly - 1 sets - 10 reps - 3 sec  hold - Standing Shoulder Row Reactive Isometric  - 2 x daily - 7 x weekly - 1 sets - 10 reps - 30-45 sec  hold - Shoulder External Rotation Reactive Isometrics  - 1 x daily - 7 x weekly - 1  sets - 5-10 reps - 10-30 sec  hold - Shoulder Internal Rotation Reactive Isometrics  - 2 x daily - 7 x weekly - 1 sets - 10 reps - 3-5 sec  hold - Shoulder Internal Rotation with Resistance  - 1 x daily - 7 x weekly - 2 sets - 10 reps - 3 sec  hold - Shoulder External Rotation with Anchored Resistance  - 1 x daily - 7 x weekly - 1-2 sets - 10 reps - 3 sec  hold - Wall Push Up  - 1 x daily - 7 x weekly - 1-3 sets - 10 reps - 3 sec  hold - Standing Pronated Elbow Flexion with Dumbbell  - 1 x daily - 7 x weekly - 1-3 sets - 10 reps - 3 sec  hold - Standing Bicep Curls Neutral with Dumbbells  - 1 x daily - 7 x weekly - 1-3 sets - 10 reps - 3 sec  hold - Forearm Pronation and Supination with Hammer  - 1 x daily - 7 x weekly - 1-3 sets - 10 reps - 3 sec  hold - Supine Chest Stretch on Foam Roll  - 2 x daily - 7 x weekly - 1 sets - 1 reps - 2-5 min  sec  hold - Standing Bilateral Low Shoulder Row with Anchored Resistance  -  1 x daily - 7 x weekly - 1-3 sets - 10 reps - 2-3 sec  hold - Drawing Bow  - 1 x daily - 7 x weekly - 1-3 sets - 10 reps - 3 sec  hold - Standing Lat Pull Down with Resistance - Elbows Bent  - 2 x daily - 7 x weekly - 1-3 sets - 10 reps - 3 sec  hold - Standing Bent Over Single Arm Scapular Row with Table Support with PLB  - 2 x daily - 7 x weekly - 1-3 sets - 10 reps - 3 sec  hold  ASSESSMENT:  CLINICAL IMPRESSION:   Patient reports some soreness in Rt arm - biceps and triceps, but soreness does not last more than a day or so. Can tell he is making progress and gaining strength. Still does not have full Rt elbow flexion equal to Lt. Continued with UE strengthening. Working on exercises and using arm for more functional activities. Minimal discomfort in Rt elbow or shoulder with exercises. Progressed resistive exercises in clinic, increasing weights and reps as tolerated.    NOTE: Patient reports MD released him to begin strengthening in preparation for RTW and does not plan to see patient again. Released to RTW June.  See job description from work so we can progress strengthening to prepare for RTW.  OBJECTIVE IMPAIRMENTS: Patient is a 57 y.o. male who was seen today for physical therapy evaluation and treatment s/p Rt distal biceps tendon repair 11/20/22. Injury to Rt elbow reported 10/16/22. Decreased mobility, decreased ROM, decreased strength, hypomobility, increased edema, increased fascial restrictions, increased muscle spasms, impaired flexibility, impaired UE functional use, improper body mechanics, postural dysfunction, and pain.   GOALS: Goals reviewed with patient? Yes  SHORT TERM GOALS: Target date: 01/27/2023  Increase ROM Rt elbow flexion/extension to full range Baseline: Goal status: met  2.  Begin active exercise Rt UE per protocol  Baseline:  Goal status: met   LONG TERM GOALS: Target date: 04/08/2023   Full pain free ROM Rt shoulder and elbow Baseline:  Goal status: on  going   2.  Increase strength Rt  UE allowing patient to return to normal functional activities including preparation for RTW  Baseline:  Goal status: on going   3.  Return to bowling as MD approves  Baseline:  Goal status: on going   4.  Patient demonstrates improved posture and alignment with posterior shoulder girdle engaged  Baseline:  Goal status: on going   5.  Independent in HEP including aquatic program as indicated  Baseline:  Goal status: on going   6.  Improve functional limitation score to 51  Baseline: 4 Goal status: on going   PLAN:  PT FREQUENCY: 2x/week  PT DURATION: 12 weeks  PLANNED INTERVENTIONS: Therapeutic exercises, Therapeutic activity, Neuromuscular re-education, Patient/Family education, Self Care, Joint mobilization, Aquatic Therapy, Dry Needling, Electrical stimulation, Spinal mobilization, Cryotherapy, Moist heat, Taping, Vasopneumatic device, Ultrasound, Ionotophoresis 4mg /ml Dexamethasone, Manual therapy, and Re-evaluation  PLAN FOR NEXT SESSION: review and progress exercises; manual therapy and PROM Rt UE per protocol; manual work; DN; modalities as indicated   [employer], PT 03/03/2023, 11:55 AM

## 2023-03-05 ENCOUNTER — Ambulatory Visit: Payer: 59 | Admitting: Rehabilitative and Restorative Service Providers"

## 2023-03-05 ENCOUNTER — Encounter: Payer: Self-pay | Admitting: Rehabilitative and Restorative Service Providers"

## 2023-03-05 DIAGNOSIS — R293 Abnormal posture: Secondary | ICD-10-CM

## 2023-03-05 DIAGNOSIS — S46219A Strain of muscle, fascia and tendon of other parts of biceps, unspecified arm, initial encounter: Secondary | ICD-10-CM | POA: Diagnosis not present

## 2023-03-05 DIAGNOSIS — M6281 Muscle weakness (generalized): Secondary | ICD-10-CM

## 2023-03-05 DIAGNOSIS — R29898 Other symptoms and signs involving the musculoskeletal system: Secondary | ICD-10-CM

## 2023-03-05 NOTE — Therapy (Signed)
OUTPATIENT PHYSICAL THERAPY SHOULDER TREATMENT   Patient Name: Darren Mcdaniel MRN: [id number] DOB:01-10-1966, 57 y.o., male Today's Date: 03/05/2023  END OF SESSION:  PT End of Session - 03/05/23 1103     Visit Number 20    Number of Visits 30    Date for PT Re-Evaluation 04/08/23    Authorization - Number of Visits 30    PT Start Time 1100    PT Stop Time 1148    PT Time Calculation (min) 48 min    Activity Tolerance Patient tolerated treatment well             Past Medical History:  Diagnosis Date   Arthritis    COVID-19 12/2019   Past Surgical History:  Procedure Laterality Date   FRACTURE SURGERY     left arm '84   Patient Active Problem List   Diagnosis Date Noted   Injury of right elbow 10/31/2022   Tophaceous gout, 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   Swelling of first metatarsophalangeal (MTP) joint of left foot 12/28/2019   Viral syndrome 11/08/2019   Triceps tendinitis 12/12/2015   Calcific tendinitis of left Quadriceps insertion 03/27/2015   Primary osteoarthritis of right hand 03/27/2015   Hyperlipidemia 03/16/2015   Dupuytren's contracture of right hand 03/10/2015   Flexor carpi radialis tenosynovitis, right 03/10/2015   Annual physical exam 03/10/2015   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    PCP: Dr Rodney Langton  REFERRING PROVIDER: Dr Ramond Marrow  REFERRING DIAG: Rt distal biceps repair   THERAPY DIAG:  Biceps tendon tear  Other symptoms and signs involving the musculoskeletal system  Abnormal posture  Muscle weakness (generalized)  Rationale for Evaluation and Treatment: Rehabilitation  ONSET DATE: injury 10/15/22; surgery 11/20/22  SUBJECTIVE:                                                                                                                                                                                      SUBJECTIVE STATEMENT:  Patient reports that he has some continued soreness in the Rt biceps and triceps. He has  carried his bowling ball around but has not tried bowling. Shoulder and elbow ROM are doing well. Now using Rt arm for more functional activities. Can tell he is getting stronger.   Surgeon cleared patient to progress with Rt UE strengthening  PERTINENT HISTORY: Patient reports that he injured Rt biceps 10/16/22 while bowling. He had some pain prior to the bowling but then significant increase in pain when bowling. He underwent surgical repair of Rt distal biceps. He has had continued pain since surgery. Fx Lt arm with ORIF ~40 yrs ago; Lt knee tendonitis  PAIN:  Are  you having pain? Yes: NPRS scale: 2/10 with movement; 0/10 when at rest  Pain location: biceps and triceps   Pain description: soreness  Aggravating factors: movement  Relieving factors: not moving   PRECAUTIONS: Other: see protocol - 6 weeks - no weight bearing or lifting > 2 pounds; out of elbow brace; active elbow flexion gravity only - no resistive work;  PROM goal full tension free elbow extension at 9 weeks (01/22/23)   WEIGHT BEARING RESTRICTIONS: Yes no weight bearing Rt UE   FALLS:  Has patient fallen in last 6 months? 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 Plans to return to this type work.  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 High-grade near-full thickness nonretracted tear of the distal biceps tendon just proximal to the insertion at the radial tuberosity. Small to moderate volume bicipitoradialis bursitis. 2. Severe tendinosis of the distal triceps tendon without tear. 3. Mild tendinosis at the origin of the  common extensor tendons without tear. 4. Advanced fatty atrophy of the flexor carpi radialis muscle, which could reflect previous trauma or sequela of chronic denervation. 5. Early mild osteoarthritic changes of the elbow joint. No effusion.  PATIENT SURVEYS:  FOTO 4; Goal 51  POSTURE: Patient presents with head forward posture with increased thoracic kyphosis; shoulders rounded and elevated; scapulae abducted and rotated along the thoracic spine; head of the humerus anterior in orientation.   UPPER EXTREMITY ROM: Rt shoulder elevation limited end ranges in brace; Rt elbow limited at 30 degrees in bledso hinged brace locked at 30 deg extension   Active ROM Right eval Right  12/18/22 Right  01/08/23 Right  01/22/23 Left eval  Shoulder flexion       Shoulder extension       Shoulder abduction       Shoulder adduction       Shoulder internal rotation       Shoulder external rotation       Elbow flexion 90 100 122 141 WNL's  Elbow extension -30 -10 0 0 WNL's   Wrist flexion       Wrist extension       Wrist ulnar deviation       Wrist radial deviation       Wrist pronation neutral 65  85   Wrist supination neutral  60  82   (Blank rows = not tested)  UPPER EXTREMITY MMT: strength not assessed resistively   MMT Right eval Left eval  Shoulder flexion    Shoulder extension    Shoulder abduction    Shoulder adduction    Shoulder internal rotation    Shoulder external rotation    Middle trapezius    Lower trapezius    Elbow flexion    Elbow extension    Wrist flexion    Wrist extension    Wrist ulnar deviation    Wrist radial deviation    Wrist pronation    Wrist supination    Grip strength (lbs)    (Blank rows = not tested)  PALPATION:  Muscular tightness Rt upper trap; leveator; pecs; proximal anterior shoulder   TODAY'S [facility] Adult PT   Treatment:                                                DATE: 03/05/23 Therapeutic Exercise: UBE L 10 x 4 min alt  fwd/back Doorway stretch 30 sec x 3 each position Shoulder flexion over door 30 sec x 2 Cable bilat biceps curl 15# 15 x 2  Cable triceps split cable 10# 15 x 2  Cable triceps bar 12.5# 15 x 3 Cable row bilat 20# 20 x 2  Rt UE single arm row 10# 15 x 3 Rt; x 2 Lt   Nustep L9 x 10 min Body blade 3 min Rt/Lt flexion chest level; bilat overhead Overhead carry 5# DB each UE x 8 laps  Farmers carry 15# Rt/Lt x 8 laps   Manual: STM/ PROM Lt forearm and elbow IASTM Rt biceps/triceps PROM into Lt elbow flexion/extension Passive stretch for elbow flexion with shoulder extension   Treatment:                                                DATE: 03/03/23 Therapeutic Exercise: UBE L 10 x 4 min alt fwd/back Doorway stretch 30 sec x 3 each position Shoulder flexion over door 30 sec x 2 Cable bilat biceps curl 15# 20 x 1;15 x 1  Cable triceps split cable 10# 20 x 1; 15 x 1  Cable triceps bar 12.5# 15 x 3 Cable row bilat 20# 20 x 2  Rt UE single arm row 10# 15 x 3 Rt; x 2 Lt   Nustep L9 x 5 min Body blade 3 min Rt/Lt flexion chest level; bilat overhead Overhead carry 5# DB each UE x 8 laps  Farmers Carry 15# Rt/Lt x 8 laps   Manual: STM/ PROM Lt forearm and elbow PROM into Lt elbow flexion/extension Passive stretch for elbow flexion with shoulder extension   Self care:  Self mobs for Rt elbow flexion    Therapeutic Exercises: UBE L8 x 4 min alt fwd/back Cable bilat biceps curl 15# 10 x 2  Cable row bilat 10# 10 x 3  Wall push up x 10 x 3 Supination/pronation elbow 90 deg flexion red TB - x 10 reps x 2 High row blue TB x 10 x 2  High row to ER to overhead red TB x 10 x 2  ER blue TB 3 sec x 10 x 2 IR blue TB 3 sec x 10 x 2 Push/pull sled w/ 25# 40 ft x 5 each  Push pull 25# wt waist height x ~ 5 min  Lifting 25# weight moving side to side x 3 min  Nustep L6 x 6 min Bodyblade flexion 1 min x 2 Rt/Lt  Bodyblade bilat UE's chest to overhead x 1 min x 2  Wt bearing on wobble board  with air-ex cushion on top for rocking side 2 min  Overhead carry 5# DB each UE x 4 laps  Farmers Carry 15# Rt/Lt x 4 laps  Wall pushup on air-ex push up/clap x 20 Triceps press up on yoga blocks 3 sec x 10 x 2  Biceps stretch  30 sec x 3  Doorway stretch 30 sec x 3 reps each position Shoulder flexion with trunk rotation blue TB simulation of bowling x 10 x 2 Shoulder extension with trunk rotation blue TB simulation of bowling x 10 x 2 Row row blue TB 3 sec x 10 x 2  Bow and arrow blue TB Rt/Lt 3 sec x 10 x 2  Antirotation blue 3 sec x 10 x 2 Rt/Lt  Shoulder extension red TB 3 sec x 10   Plank on low table 30 sec x 1; 45 sec x 2 on forearms Bent row 5# KB x 10 x 2 AAROM Rt elbow into full flexion 10-20 sec hold x 2  Wall pushup on air-ex push up/clap x 20 Triceps press up on yoga blocks 3 sec x 10 x 2  Biceps stretch 30 sec x 3  Doorway stretch 30 sec x 3 reps each position Shoulder flexion with trunk rotation blue TB simulation of bowling x 10 x 2 Shoulder extension with trunk rotation blue TB simulation of bowling x 10 x 2 Row row blue TB 3 sec x 10 x 2  Bow and arrow blue TB Rt/Lt 3 sec x 10 x 2  Antirotation blue 3 sec x 10 x 2 Rt/Lt  Shoulder extension red TB 3 sec x 10  Supination/pronation elbow 90 deg flexion red TB - x 10 reps   Plank on low table 30 sec x 1; 45 sec x 2 on forearms Bent row 5# KB x 10 x 2 AAROM Rt elbow into full flexion 10-20 sec hold x 2   PATIENT EDUCATION: Education details: POC; HEP Person educated: Patient Education method: Programmer, multimedia, Facilities manager, Actor cues, Verbal cues, and Handouts Education comprehension: verbalized understanding, returned demonstration, verbal cues required, tactile cues required, and needs further education  HOME EXERCISE PROGRAM: Access Code: [email or web address] URL: [email or web address] Date: 02/05/2023 Prepared by: Corlis Leak  Exercises - Supine Cervical Retraction with Towel  - 2 x daily - 7 x weekly - 1  sets - 5-10 reps - 10 sec  hold - Seated Cervical Retraction  - 2 x daily - 7 x weekly - 1-2 sets - 5-10 reps - 10 sec  hold - Supine Scapular Retraction  - 2 x daily - 7 x weekly - 1 sets - 10 reps - 5-10 sec  hold - Seated Scapular Retraction  - 2 x daily - 7 x weekly - 1-2 sets - 10 reps - 10 sec  hold - Seated Cervical Sidebending AROM  - 2 x daily - 7 x weekly - 1 sets - 5 reps - 5-10 sec  hold - Standing Isometric Shoulder Extension with Doorway - Arm Bent  - 1 x daily - 7 x weekly - 1 sets - 5-10 reps - 5 sec  hold - Standing Isometric Shoulder Abduction with Doorway - Arm Bent  - 1 x daily - 7 x weekly - 1 sets - 5 reps - 5 sec  hold - Standing Isometric Shoulder Adduction with Ball  - 1 x daily - 7 x weekly - 1 sets - 10 reps - 3 sec  hold - Isometric Shoulder External Rotation at Wall  - 1 x daily - 7 x weekly - 1 sets - 10 reps - 3 sec  hold - Standing Shoulder Row Reactive Isometric  - 2 x daily - 7 x weekly - 1 sets - 10 reps - 30-45 sec  hold - Shoulder External Rotation Reactive Isometrics  -  1 x daily - 7 x weekly - 1 sets - 5-10 reps - 10-30 sec  hold - Shoulder Internal Rotation Reactive Isometrics  - 2 x daily - 7 x weekly - 1 sets - 10 reps - 3-5 sec  hold - Shoulder Internal Rotation with Resistance  - 1 x daily - 7 x weekly - 2 sets - 10 reps - 3 sec  hold - Shoulder External Rotation with Anchored Resistance  - 1 x daily - 7 x weekly - 1-2 sets - 10 reps - 3 sec  hold - Wall Push Up  - 1 x daily - 7 x weekly - 1-3 sets - 10 reps - 3 sec  hold - Standing Pronated Elbow Flexion with Dumbbell  - 1 x daily - 7 x weekly - 1-3 sets - 10 reps - 3 sec  hold - Standing Bicep Curls Neutral with Dumbbells  - 1 x daily - 7 x weekly - 1-3 sets - 10 reps - 3 sec  hold - Forearm Pronation and Supination with Hammer  - 1 x daily - 7 x weekly - 1-3 sets - 10 reps - 3 sec  hold - Supine Chest Stretch on Foam Roll  - 2 x daily - 7 x weekly - 1 sets - 1 reps - 2-5 min  sec  hold - Standing  Bilateral Low Shoulder Row with Anchored Resistance  - 1 x daily - 7 x weekly - 1-3 sets - 10 reps - 2-3 sec  hold - Drawing Bow  - 1 x daily - 7 x weekly - 1-3 sets - 10 reps - 3 sec  hold - Standing Lat Pull Down with Resistance - Elbows Bent  - 2 x daily - 7 x weekly - 1-3 sets - 10 reps - 3 sec  hold - Standing Bent Over Single Arm Scapular Row with Table Support with PLB  - 2 x daily - 7 x weekly - 1-3 sets - 10 reps - 3 sec  hold  ASSESSMENT:  CLINICAL IMPRESSION:   Patient reports some continued soreness in Rt arm - biceps and triceps. Continues to gain strength; increasing resistance and reps for exercises. Rt elbow flexion is ~ equal to Lt now.  Plan to continue with UE strengthening. Working on exercises and using arm for more functional activities. Minimal discomfort in Rt elbow and shoulder with exercises. Does have soreness after exercises and with functional activities.    NOTE: Patient reports MD released him to begin strengthening in preparation for RTW and does not plan to see patient again. Released to RTW June.  See job description from work so we can progress strengthening to prepare for RTW.  OBJECTIVE IMPAIRMENTS: Patient is a 57 y.o. male who was seen today for physical therapy evaluation and treatment s/p Rt distal biceps tendon repair 11/20/22. Injury to Rt elbow reported 10/16/22. Decreased mobility, decreased ROM, decreased strength, hypomobility, increased edema, increased fascial restrictions, increased muscle spasms, impaired flexibility, impaired UE functional use, improper body mechanics, postural dysfunction, and pain.   GOALS: Goals reviewed with patient? Yes  SHORT TERM GOALS: Target date: 01/27/2023  Increase ROM Rt elbow flexion/extension to full range Baseline: Goal status: met  2.  Begin active exercise Rt UE per protocol  Baseline:  Goal status: met   LONG TERM GOALS: Target date: 04/08/2023   Full pain free ROM Rt shoulder and elbow Baseline:   Goal status: on going   2.  Increase strength Rt UE  allowing patient to return to normal functional activities including preparation for RTW  Baseline:  Goal status: on going   3.  Return to bowling as MD approves  Baseline:  Goal status: on going   4.  Patient demonstrates improved posture and alignment with posterior shoulder girdle engaged  Baseline:  Goal status: on going   5.  Independent in HEP including aquatic program as indicated  Baseline:  Goal status: on going   6.  Improve functional limitation score to 51  Baseline: 4 Goal status: on going   PLAN:  PT FREQUENCY: 2x/week  PT DURATION: 12 weeks  PLANNED INTERVENTIONS: Therapeutic exercises, Therapeutic activity, Neuromuscular re-education, Patient/Family education, Self Care, Joint mobilization, Aquatic Therapy, Dry Needling, Electrical stimulation, Spinal mobilization, Cryotherapy, Moist heat, Taping, Vasopneumatic device, Ultrasound, Ionotophoresis 4mg /ml Dexamethasone, Manual therapy, and Re-evaluation  PLAN FOR NEXT SESSION: review and progress exercises; manual therapy and PROM Rt UE per protocol; manual work; DN; modalities as indicated   [employer], PT 03/05/2023, 11:04 AM

## 2023-03-12 ENCOUNTER — Encounter: Payer: Self-pay | Admitting: Rehabilitative and Restorative Service Providers"

## 2023-03-12 ENCOUNTER — Ambulatory Visit: Payer: 59 | Admitting: Rehabilitative and Restorative Service Providers"

## 2023-03-12 DIAGNOSIS — S46219A Strain of muscle, fascia and tendon of other parts of biceps, unspecified arm, initial encounter: Secondary | ICD-10-CM

## 2023-03-12 DIAGNOSIS — R293 Abnormal posture: Secondary | ICD-10-CM

## 2023-03-12 DIAGNOSIS — R29898 Other symptoms and signs involving the musculoskeletal system: Secondary | ICD-10-CM

## 2023-03-12 DIAGNOSIS — M6281 Muscle weakness (generalized): Secondary | ICD-10-CM

## 2023-03-12 NOTE — Therapy (Addendum)
 OUTPATIENT PHYSICAL THERAPY SHOULDER TREATMENT PHYSICAL THERAPY DISCHARGE SUMMARY  Visits from Start of Care: 21  Current functional level related to goals / functional outcomes: See progress note for discharge status    Remaining deficits: Needs to continue with progressive strengthening    Education / Equipment: HEP    Patient agrees to discharge. Patient goals were met. Patient is being discharged due to meeting the stated rehab goals.  Darren Mcdaniel P. Leonor Liv PT, MPH 12/18/23 2:32 PM   Patient Name: Darren Mcdaniel MRN: [id number] DOB:[birth year], 57 y.o., male Today's Date: 03/12/2023  END OF SESSION:  PT End of Session - 03/12/23 1111     Visit Number 21    Number of Visits 30    Date for PT Re-Evaluation 04/08/23    Authorization - Visit Number 21    Authorization - Number of Visits 30    PT Start Time 1100    PT Stop Time 1145    PT Time Calculation (min) 45 min    Activity Tolerance Patient tolerated treatment well             Past Medical History:  Diagnosis Date   Arthritis    COVID-19 12/2019   Past Surgical History:  Procedure Laterality Date   FRACTURE SURGERY     left arm '84   Patient Active Problem List   Diagnosis Date Noted   Injury of right elbow 10/31/2022   Tophaceous gout, 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   Swelling of first metatarsophalangeal (MTP) joint of left foot 12/28/2019   Viral syndrome 11/08/2019   Triceps tendinitis 12/12/2015   Calcific tendinitis of left Quadriceps insertion 03/27/2015   Primary osteoarthritis of right hand 03/27/2015   Hyperlipidemia 03/16/2015   Dupuytren's contracture of right hand 03/10/2015   Flexor carpi radialis tenosynovitis, right 03/10/2015   Annual physical exam 03/10/2015   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    PCP: Dr Rodney Langton  REFERRING PROVIDER: Dr Ramond Marrow  REFERRING DIAG: Rt distal biceps repair   THERAPY DIAG:  Biceps tendon tear  Other symptoms and signs involving the  musculoskeletal system  Abnormal posture  Muscle weakness (generalized)  Rationale for Evaluation and Treatment: Rehabilitation  ONSET DATE: injury 10/15/22; surgery 11/20/22  SUBJECTIVE:                                                                                                                                                                                      SUBJECTIVE STATEMENT:  Patient reports that he has increased pain in the Rt elbow which is related to his gout. He has carried his bowling ball around but has not tried bowling. Shoulder and elbow ROM  are doing well. Now using Rt arm for more functional activities. Can tell he is getting stronger.   Surgeon cleared patient to progress with Rt UE strengthening  PERTINENT HISTORY: Patient reports that he injured Rt biceps 10/16/22 while bowling. He had some pain prior to the bowling but then significant increase in pain when bowling. He underwent surgical repair of Rt distal biceps. He has had continued pain since surgery. Fx Lt arm with ORIF ~40 yrs ago; Lt knee tendonitis  PAIN:  Are you having pain? Yes: NPRS scale: 2/10 with movement; 0/10 when at rest  Today elbow pain 6/10 due to gout  Pain location: biceps and triceps   Pain description: soreness  Aggravating factors: movement  Relieving factors: not moving   PRECAUTIONS: Other: see protocol - 6 weeks - no weight bearing or lifting > 2 pounds; out of elbow brace; active elbow flexion gravity only - no resistive work;  PROM goal full tension free elbow extension at 9 weeks (01/22/23)   WEIGHT BEARING RESTRICTIONS: Yes no weight bearing Rt UE   FALLS:  Has patient fallen in last 6 months? 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 Plans to  return to this type work.  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 High-grade near-full thickness nonretracted tear of the distal biceps tendon just proximal to the insertion at the radial tuberosity. Small to moderate volume bicipitoradialis bursitis. 2. Severe tendinosis of the distal triceps tendon without tear. 3. Mild tendinosis at the origin of the common extensor tendons without tear. 4. Advanced fatty atrophy of the flexor carpi radialis muscle, which could reflect previous trauma or sequela of chronic denervation. 5. Early mild osteoarthritic changes of the elbow joint. No effusion.  PATIENT SURVEYS:  FOTO 4; Goal 51  POSTURE: Patient presents with head forward posture with increased thoracic kyphosis; shoulders rounded and elevated; scapulae abducted and rotated along the thoracic spine; head of the humerus anterior in orientation.   UPPER EXTREMITY ROM: Rt shoulder elevation limited end ranges in brace; Rt elbow limited at 30 degrees in bledso hinged brace locked at 30 deg extension   Active ROM Right eval Right  12/18/22 Right  01/08/23 Right  01/22/23 Left eval  Shoulder flexion       Shoulder extension       Shoulder abduction       Shoulder adduction       Shoulder internal rotation       Shoulder external rotation       Elbow flexion 90 100 122 141 WNL's  Elbow extension -30 -10 0 0 WNL's   Wrist flexion       Wrist extension       Wrist ulnar deviation       Wrist radial deviation       Wrist pronation neutral 65  85   Wrist supination neutral  60  82   (Blank rows = not tested)  UPPER EXTREMITY MMT: strength not assessed resistively   MMT Right eval Left eval  Shoulder flexion  Shoulder extension    Shoulder abduction    Shoulder adduction    Shoulder internal rotation    Shoulder  external rotation    Middle trapezius    Lower trapezius    Elbow flexion    Elbow extension    Wrist flexion    Wrist extension    Wrist ulnar deviation    Wrist radial deviation    Wrist pronation    Wrist supination    Grip strength (lbs)    (Blank rows = not tested)  PALPATION:  Muscular tightness Rt upper trap; leveator; pecs; proximal anterior shoulder   TODAY'S [facility] Adult PT    Treatment:                                                DATE: 03/12/23 Therapeutic Exercise: UBE L 10 x 4 min alt fwd/back Doorway stretch 30 sec x 3 each position Shoulder flexion over door 30 sec x 2 Cable bilat biceps curl 10# 15 x 2  Cable triceps bar 5 # 12 x 2 Cable row bilat 10# 20 x 2   Nustep L9 x 10 min Farmer's carry 15# KB bilat x 8 laps  Manual:   Treatment:                                                DATE: 03/05/23 Therapeutic Exercise: UBE L 10 x 4 min alt fwd/back Doorway stretch 30 sec x 3 each position Shoulder flexion over door 30 sec x 2 Cable bilat biceps curl 15# 15 x 2  Cable triceps split cable 10# 15 x 2  Cable triceps bar 12.5# 15 x 3 Cable row bilat 20# 20 x 2  Rt UE single arm row 10# 15 x 3 Rt; x 2 Lt   Nustep L9 x 10 min Body blade 3 min Rt/Lt flexion chest level; bilat overhead Overhead carry 5# DB each UE x 8 laps  Farmers carry 15# Rt/Lt x 8 laps   Manual: STM/ PROM Lt forearm and elbow IASTM Rt biceps/triceps PROM into Lt elbow flexion/extension Passive stretch for elbow flexion with shoulder extension   Treatment:                                                DATE: 03/03/23 Therapeutic Exercise: UBE L 10 x 4 min alt fwd/back Doorway stretch 30 sec x 3 each position Shoulder flexion over door 30 sec x 2 Cable bilat biceps curl 15# 20 x 1;15 x 1  Cable triceps split cable 10# 20 x 1; 15 x 1  Cable triceps bar 12.5# 15 x 3 Cable row bilat 20# 20 x 2  Rt UE single arm row 10# 15 x 3 Rt; x 2 Lt   Nustep L9 x 5 min Body blade 3 min Rt/Lt  flexion chest level; bilat overhead Overhead carry 5# DB each UE x 8 laps  Farmers Carry 15# Rt/Lt x 8 laps   Manual: STM/ PROM Lt forearm and elbow PROM into Lt elbow flexion/extension Passive stretch for elbow flexion with shoulder extension  Self care:  Self mobs for Rt elbow flexion    Therapeutic Exercises: UBE L8 x 4 min alt fwd/back Cable bilat biceps curl 15# 10 x 2  Cable row bilat 10# 10 x 3  Wall push up x 10 x 3 Supination/pronation elbow 90 deg flexion red TB - x 10 reps x 2 High row blue TB x 10 x 2  High row to ER to overhead red TB x 10 x 2  ER blue TB 3 sec x 10 x 2 IR blue TB 3 sec x 10 x 2 Push/pull sled w/ 25# 40 ft x 5 each  Push pull 25# wt waist height x ~ 5 min  Lifting 25# weight moving side to side x 3 min  Nustep L6 x 6 min Bodyblade flexion 1 min x 2 Rt/Lt  Bodyblade bilat UE's chest to overhead x 1 min x 2  Wt bearing on wobble board with air-ex cushion on top for rocking side 2 min  Overhead carry 5# DB each UE x 4 laps  Farmers Carry 15# Rt/Lt x 4 laps  Wall pushup on air-ex push up/clap x 20 Triceps press up on yoga blocks 3 sec x 10 x 2  Biceps stretch 30 sec x 3  Doorway stretch 30 sec x 3 reps each position Shoulder flexion with trunk rotation blue TB simulation of bowling x 10 x 2 Shoulder extension with trunk rotation blue TB simulation of bowling x 10 x 2 Row row blue TB 3 sec x 10 x 2  Bow and arrow blue TB Rt/Lt 3 sec x 10 x 2  Antirotation blue 3 sec x 10 x 2 Rt/Lt  Shoulder extension red TB 3 sec x 10   Plank on low table 30 sec x 1; 45 sec x 2 on forearms Bent row 5# KB x 10 x 2 AAROM Rt elbow into full flexion 10-20 sec hold x 2  Wall pushup on air-ex push up/clap x 20 Triceps press up on yoga blocks 3 sec x 10 x 2  Biceps stretch 30 sec x 3  Doorway stretch 30 sec x 3 reps each position Shoulder flexion with trunk rotation blue TB simulation of bowling x 10 x 2 Shoulder extension with trunk rotation blue TB simulation of  bowling x 10 x 2 Row row blue TB 3 sec x 10 x 2  Bow and arrow blue TB Rt/Lt 3 sec x 10 x 2  Antirotation blue 3 sec x 10 x 2 Rt/Lt  Shoulder extension red TB 3 sec x 10  Supination/pronation elbow 90 deg flexion red TB - x 10 reps   Plank on low table 30 sec x 1; 45 sec x 2 on forearms Bent row 5# KB x 10 x 2 AAROM Rt elbow into full flexion 10-20 sec hold x 2   PATIENT EDUCATION: Education details: POC; HEP Person educated: Patient Education method: Programmer, multimedia, Facilities manager, Actor cues, Verbal cues, and Handouts Education comprehension: verbalized understanding, returned demonstration, verbal cues required, tactile cues required, and needs further education  HOME EXERCISE PROGRAM: Access Code: [email or web address] URL: [email or web address] Date: 02/05/2023 Prepared by: Corlis Leak  Exercises - Supine Cervical Retraction with Towel  - 2 x daily - 7 x weekly - 1 sets - 5-10 reps - 10 sec  hold - Seated Cervical Retraction  - 2 x daily - 7 x weekly - 1-2 sets - 5-10 reps - 10 sec  hold - Supine Scapular Retraction  - 2 x daily -  7 x weekly - 1 sets - 10 reps - 5-10 sec  hold - Seated Scapular Retraction  - 2 x daily - 7 x weekly - 1-2 sets - 10 reps - 10 sec  hold - Seated Cervical Sidebending AROM  - 2 x daily - 7 x weekly - 1 sets - 5 reps - 5-10 sec  hold - Standing Isometric Shoulder Extension with Doorway - Arm Bent  - 1 x daily - 7 x weekly - 1 sets - 5-10 reps - 5 sec  hold - Standing Isometric Shoulder Abduction with Doorway - Arm Bent  - 1 x daily - 7 x weekly - 1 sets - 5 reps - 5 sec  hold - Standing Isometric Shoulder Adduction with Ball  - 1 x daily - 7 x weekly - 1 sets - 10 reps - 3 sec  hold - Isometric Shoulder External Rotation at Wall  - 1 x daily - 7 x weekly - 1 sets - 10 reps - 3 sec  hold - Standing Shoulder Row Reactive Isometric  - 2 x daily - 7 x weekly - 1 sets - 10 reps - 30-45 sec  hold - Shoulder External Rotation Reactive Isometrics  - 1 x daily -  7 x weekly - 1 sets - 5-10 reps - 10-30 sec  hold - Shoulder Internal Rotation Reactive Isometrics  - 2 x daily - 7 x weekly - 1 sets - 10 reps - 3-5 sec  hold - Shoulder Internal Rotation with Resistance  - 1 x daily - 7 x weekly - 2 sets - 10 reps - 3 sec  hold - Shoulder External Rotation with Anchored Resistance  - 1 x daily - 7 x weekly - 1-2 sets - 10 reps - 3 sec  hold - Wall Push Up  - 1 x daily - 7 x weekly - 1-3 sets - 10 reps - 3 sec  hold - Standing Pronated Elbow Flexion with Dumbbell  - 1 x daily - 7 x weekly - 1-3 sets - 10 reps - 3 sec  hold - Standing Bicep Curls Neutral with Dumbbells  - 1 x daily - 7 x weekly - 1-3 sets - 10 reps - 3 sec  hold - Forearm Pronation and Supination with Hammer  - 1 x daily - 7 x weekly - 1-3 sets - 10 reps - 3 sec  hold - Supine Chest Stretch on Foam Roll  - 2 x daily - 7 x weekly - 1 sets - 1 reps - 2-5 min  sec  hold - Standing Bilateral Low Shoulder Row with Anchored Resistance  - 1 x daily - 7 x weekly - 1-3 sets - 10 reps - 2-3 sec  hold - Drawing Bow  - 1 x daily - 7 x weekly - 1-3 sets - 10 reps - 3 sec  hold - Standing Lat Pull Down with Resistance - Elbows Bent  - 2 x daily - 7 x weekly - 1-3 sets - 10 reps - 3 sec  hold - Standing Bent Over Single Arm Scapular Row with Table Support with PLB  - 2 x daily - 7 x weekly - 1-3 sets - 10 reps - 3 sec  hold  ASSESSMENT:  CLINICAL IMPRESSION:   Patient reports significant flare up of gout Rt elbow. He is taking his medication but the elbow is still painful limiting activities and exercises with Rt UE. Patient is scheduled to RTW Monday and feels  the gout will be resolved by the time he returns to work. He has some persistent soreness in Rt arm - biceps and triceps. He continues to gain strength; increasing resistance and reps for exercises. Rt elbow flexion is ~ equal to Lt now.  Plan to continue with UE strengthening as tolerated. Typically working on exercises and using arm for more functional  activities but has limited tolerance for exercises today due to gout in Rt elbow. Modified all exercises and eliminated some exercises due to pain and discomfort in Rt elbow. Most of goals have been accomplished. Unable to fully assess functional state due to gout Rt elbow.  NOTE: Patient reports MD released him to begin strengthening in preparation for RTW and does not plan to see patient again. Released to RTW June.  See job description from work so we can progress strengthening to prepare for RTW.  OBJECTIVE IMPAIRMENTS: Patient is a 57 y.o. male who was seen today for physical therapy evaluation and treatment s/p Rt distal biceps tendon repair 11/20/22. Injury to Rt elbow reported 10/16/22. Decreased mobility, decreased ROM, decreased strength, hypomobility, increased edema, increased fascial restrictions, increased muscle spasms, impaired flexibility, impaired UE functional use, improper body mechanics, postural dysfunction, and pain.   GOALS: Goals reviewed with patient? Yes  SHORT TERM GOALS: Target date: 01/27/2023  Increase ROM Rt elbow flexion/extension to full range Baseline: Goal status: met  2.  Begin active exercise Rt UE per protocol  Baseline:  Goal status: met   LONG TERM GOALS: Target date: 04/08/2023   Full pain free ROM Rt shoulder and elbow Baseline:  Goal status: met   2.  Increase strength Rt UE allowing patient to return to normal functional activities including preparation for RTW  Baseline:  Goal status: met   3.  Return to bowling as MD approves  Baseline:  Goal status: not met   4.  Patient demonstrates improved posture and alignment with posterior shoulder girdle engaged  Baseline:  Goal status: met   5.  Independent in HEP including aquatic program as indicated  Baseline:  Goal status: met   6.  Improve functional limitation score to 51  Baseline: 68 Goal status: met  PLAN:  PT FREQUENCY: 2x/week  PT DURATION: 12 weeks  PLANNED  INTERVENTIONS: Therapeutic exercises, Therapeutic activity, Neuromuscular re-education, Patient/Family education, Self Care, Joint mobilization, Aquatic Therapy, Dry Needling, Electrical stimulation, Spinal mobilization, Cryotherapy, Moist heat, Taping, Vasopneumatic device, Ultrasound, Ionotophoresis 4mg /ml Dexamethasone, Manual therapy, and Re-evaluation  PLAN FOR NEXT SESSION: review and progress exercises; manual therapy and PROM Rt UE per protocol; manual work; DN; modalities as indicated   [employer], PT 03/12/2023, 11:11 AM

## 2023-05-07 ENCOUNTER — Ambulatory Visit (INDEPENDENT_AMBULATORY_CARE_PROVIDER_SITE_OTHER): Payer: 59 | Admitting: Sports Medicine

## 2023-05-07 ENCOUNTER — Other Ambulatory Visit (INDEPENDENT_AMBULATORY_CARE_PROVIDER_SITE_OTHER): Payer: 59

## 2023-05-07 DIAGNOSIS — M19041 Primary osteoarthritis, right hand: Secondary | ICD-10-CM

## 2023-05-07 MED ORDER — TRIAMCINOLONE ACETONIDE 40 MG/ML IJ SUSP
40.0000 mg | Freq: Once | INTRAMUSCULAR | Status: AC
  Administered 2023-05-07: 40 mg via INTRAMUSCULAR

## 2023-05-07 NOTE — Progress Notes (Signed)
    Procedures performed today:    Procedure: Real-time Ultrasound Guided injection of the right third MCP Device: Samsung HS60  Verbal informed consent obtained.  Time-out conducted.  Noted no overlying erythema, induration, or other signs of local infection.  Skin prepped in a sterile fashion.  Local anesthesia: Topical Ethyl chloride.  With sterile technique and under real time ultrasound guidance: Noted synovitis and arthritic changes, 1/2 cc lidocaine, 1/2 cc Kenalog 40 injected easily. Completed without difficulty  Advised to call if fevers/chills, erythema, induration, drainage, or persistent bleeding.  Images permanently stored and available for review in PACS.  Impression: Technically successful ultrasound guided injection.  Independent interpretation of notes and tests performed by another provider:   None.  Brief History, Exam, Impression, and Recommendations:    Primary osteoarthritis of right hand Very pleasant 57 year old male right third MCP osteoarthritis last injected April 2024, recurrence of pain, repeat right third MCP injection today, return to see me as needed.    ____________________________________________ Ihor Austin. Benjamin Stain, M.D., ABFM., CAQSM., AME. Primary Care and Sports Medicine [facility]  Adjunct Professor of Family Medicine  [employer]  Restaurant manager, fast food

## 2023-05-07 NOTE — Assessment & Plan Note (Signed)
Very pleasant 57 year old male right third MCP osteoarthritis last injected April 2024, recurrence of pain, repeat right third MCP injection today, return to see me as needed.

## 2023-06-26 ENCOUNTER — Encounter: Payer: Self-pay | Admitting: Emergency Medicine

## 2023-06-26 ENCOUNTER — Ambulatory Visit
Admission: EM | Admit: 2023-06-26 | Discharge: 2023-06-26 | Disposition: A | Discharge status: Home / Self Care | Payer: 59 | Attending: Family Medicine | Admitting: Family Medicine

## 2023-06-26 DIAGNOSIS — J3489 Other specified disorders of nose and nasal sinuses: Secondary | ICD-10-CM

## 2023-06-26 DIAGNOSIS — R059 Cough, unspecified: Secondary | ICD-10-CM

## 2023-06-26 DIAGNOSIS — J069 Acute upper respiratory infection, unspecified: Secondary | ICD-10-CM

## 2023-06-26 DIAGNOSIS — H6692 Otitis media, unspecified, left ear: Secondary | ICD-10-CM

## 2023-06-26 LAB — POC SARS CORONAVIRUS 2 AG -  ED: SARS Coronavirus 2 Ag: NEGATIVE

## 2023-06-26 LAB — POCT RAPID STREP A (OFFICE): Rapid Strep A Screen: NEGATIVE

## 2023-06-26 MED ORDER — AMOXICILLIN-POT CLAVULANATE 875-125 MG PO TABS: 1.0000 | ORAL_TABLET | Freq: Two times a day (BID) | ORAL | 0 refills | Status: AC

## 2023-06-26 MED ORDER — PREDNISONE 10 MG (21) PO TBPK: ORAL_TABLET | Freq: Every day | ORAL | 0 refills | Status: AC

## 2023-06-26 NOTE — ED Provider Notes (Signed)
[facility]    CSN: [id number] Arrival date & time: 06/26/23  1304      History   Chief Complaint Chief Complaint  Patient presents with   Cough   Nasal Congestion   Otalgia    HPI Darren Mcdaniel is a 57 y.o. male.   HPI 57 year old male presents with cough, sore throat, headache, nasal congestion, facial pressure, left ear pain, chest soreness from coughing, and fever starting on Tuesday.  Reports taking OTC NSAID and antipyretic for symptom relief.  PMH significant for viral syndrome, HLD, and primary osteoarthritis of right hand.  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   Swelling of first metatarsophalangeal (MTP) joint of left foot 12/28/2019   Viral syndrome 11/08/2019   Triceps tendinitis 12/12/2015   Calcific tendinitis of left Quadriceps insertion 03/27/2015   Primary osteoarthritis of right hand 03/27/2015   Hyperlipidemia 03/16/2015   Dupuytren's contracture of right hand 03/10/2015   Flexor carpi radialis tenosynovitis, right 03/10/2015   Annual physical exam 03/10/2015   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 Authorizing Provider  allopurinol (ZYLOPRIM) 300 MG tablet Take 1 tablet (300 mg total) by mouth 2 (two) times daily. 09/03/22  Yes Monica Becton, MD  amoxicillin-clavulanate (AUGMENTIN) 875-125 MG tablet Take 1 tablet by mouth 2 (two) times daily for 10 days. 06/26/23 07/06/23 Yes Trevor Iha, FNP  meloxicam (MOBIC) 15 MG tablet Take 1 tablet (15 mg total) by mouth daily. 09/03/22  Yes Monica Becton, MD  predniSONE (STERAPRED UNI-PAK 21 TAB) 10 MG (21) TBPK tablet  Take by mouth daily. Take 6 tabs by mouth daily  for 2 days, then 5 tabs for 2 days, then 4 tabs for 2 days, then 3 tabs for 2 days, 2 tabs for 2 days, then 1 tab by mouth daily for 2 days 06/26/23  Yes Trevor Iha, FNP  colchicine 0.6 MG tablet 1 tab p.o. twice daily for a week for flares. 09/03/22   Monica Becton, MD  HYDROcodone-acetaminophen (NORCO/VICODIN) 5-325 MG tablet Take 1 tablet by mouth every 8 (eight) hours as needed for moderate pain. 10/31/22   Monica Becton, MD  traMADol (ULTRAM) 50 MG tablet Take 1-2 tablets (50-100 mg total) by mouth every 8 (eight) hours as needed for moderate pain. Maximum 6 tabs per day. 05/08/20 10/04/20  Monica Becton, MD    Family History Family History  Problem Relation Age of Onset   Diabetes Father    Alcohol abuse Paternal Uncle    Healthy Mother     Social History Social History   Tobacco Use   Smoking status: Never   Smokeless tobacco: Current    Types: Chew  Vaping Use   Vaping status: Never Used  Substance Use Topics   Alcohol use: Yes    Alcohol/week: 4.0 standard drinks of alcohol    Types: 4 Standard drinks or equivalent per week   Drug use: No     Allergies   Patient has no known allergies.   Review of Systems Review of Systems  Constitutional:  Positive for fever.  HENT:  Positive for congestion, ear pain, sinus pressure and sinus pain.   Respiratory:  Positive for cough.   Neurological:  Positive for headaches.     Physical Exam Triage Vital Signs ED Triage Vitals  Encounter Vitals Group     BP      Systolic BP Percentile      Diastolic BP Percentile      Pulse      Resp      Temp      Temp src      SpO2      Weight      Height      Head Circumference      Peak Flow      Pain Score      Pain Loc      Pain Education      Exclude from Growth Chart    No data found.  Updated Vital Signs BP (!) 147/89 (BP Location: Left Arm)   Pulse 87   Temp 99 F (37.2 C) (Oral)    Resp 16   SpO2 98%       Physical Exam Vitals and nursing note reviewed.  Constitutional:      General: He is not in acute distress.    Appearance: Normal appearance. He is ill-appearing. He is not toxic-appearing or diaphoretic.  HENT:     Head: Normocephalic and atraumatic.     Right Ear: External ear normal.     Left Ear: External ear normal.     Ears:     Comments: Left TM: Erythematous, bulging; moderate eustachian tube dysfunction noted bilaterally    Mouth/Throat:     Mouth: Mucous membranes are moist.     Pharynx: Oropharynx is clear.  Eyes:     Extraocular Movements: Extraocular movements intact.     Conjunctiva/sclera: Conjunctivae normal.     Pupils: Pupils are equal, round, and reactive to light.  Cardiovascular:     Rate and Rhythm: Normal rate and regular rhythm.     Pulses: Normal pulses.     Heart sounds: Normal heart sounds.  Pulmonary:     Effort: Pulmonary effort is normal.     Breath sounds: Normal breath sounds. No wheezing, rhonchi or rales.     Comments: Frequent nonproductive cough noted on exam Musculoskeletal:        General: Normal range of motion.     Cervical back: Normal range of motion and neck supple. No tenderness.  Lymphadenopathy:     Cervical: No cervical adenopathy.  Skin:    General: Skin is warm and dry.  Neurological:     General: No focal deficit present.     Mental Status: He is alert and oriented to person, place, and time. Mental status is at baseline.  Psychiatric:        Mood and Affect: Mood normal.        Behavior: Behavior normal.      UC Treatments / Results  Labs (all labs ordered are listed, but only abnormal results are displayed) Labs Reviewed  POC SARS CORONAVIRUS 2 AG -  ED  POCT RAPID STREP A (OFFICE)    EKG   Radiology No results found.  Procedures Procedures (including critical care time)  Medications Ordered in UC Medications - No data to display  Initial Impression / Assessment and Plan /  UC Course  I have reviewed the triage vital signs and the nursing notes.  Pertinent labs &  imaging results that were available during my care of the patient were reviewed by me and considered in my medical decision making (see chart for details).     MDM: 1.  Acute left otitis media-Rx'd Augmentin 875/125 mg tablet: Take 1 tablet twice daily x 10 days; 2.  Cough, unspecified type-Rx'd Sterapred Unipak (tapering from 60 mg to 10 mg over 10 days); 3.  Sinus pressure-same as 2. Rx'd Sterapred Unipak (tapering from 60 mg to 10 mg over 10 days). Advised patient to take medication as directed with food to completion.  Advised patient to take prednisone with first dose of Augmentin for the next 10 days.  Advised may take OTC Tylenol 1 g every 6 hours for fever (oral temperature greater than 100.3).  Encouraged to increase daily water intake to 64 ounces per day while taking these medications.  Advised if symptoms worsen and/or unresolved please follow-up with PCP or here for further evaluation.  Patient discharged home, hemodynamically stable.  Work note provided to patient prior to discharge per request. Final Clinical Impressions(s) / UC Diagnoses   Final diagnoses:  Cough, unspecified type  Acute left otitis media  Sinus pressure     Discharge Instructions      Advised patient to take medication as directed with food to completion.  Advised patient to take prednisone with first dose of Augmentin for the next 10 days.  Advised may take OTC Tylenol 1 g every 6 hours for fever (oral temperature greater than 100.3).  Encouraged to increase daily water intake to 64 ounces per day while taking these medications.  Advised if symptoms worsen and/or unresolved please follow-up with PCP or here for further evaluation.     ED Prescriptions     Medication Sig Dispense Auth. Provider   amoxicillin-clavulanate (AUGMENTIN) 875-125 MG tablet Take 1 tablet by mouth 2 (two) times daily for 10 days. 20 tablet  Trevor Iha, FNP   predniSONE (STERAPRED UNI-PAK 21 TAB) 10 MG (21) TBPK tablet Take by mouth daily. Take 6 tabs by mouth daily  for 2 days, then 5 tabs for 2 days, then 4 tabs for 2 days, then 3 tabs for 2 days, 2 tabs for 2 days, then 1 tab by mouth daily for 2 days 42 tablet Trevor Iha, FNP      PDMP not reviewed this encounter.   Trevor Iha, FNP 06/26/23 1358

## 2023-06-26 NOTE — Discharge Instructions (Addendum)
Advised patient to take medication as directed with food to completion.  Advised patient to take prednisone with first dose of Augmentin for the next 10 days.  Advised may take OTC Tylenol 1 g every 6 hours for fever (oral temperature greater than 100.3).  Encouraged to increase daily water intake to 64 ounces per day while taking these medications.  Advised if symptoms worsen and/or unresolved please follow-up with PCP or here for further evaluation.

## 2023-06-26 NOTE — ED Triage Notes (Addendum)
Cough, sore throat, headache, nasal congestion, facial pressure, left ear pain, chest soreness with coughing, fever all starting Tuesday. Taking ibuprofen and tylenol for symptom relief.

## 2023-09-25 ENCOUNTER — Other Ambulatory Visit (INDEPENDENT_AMBULATORY_CARE_PROVIDER_SITE_OTHER): Payer: 59

## 2023-09-25 ENCOUNTER — Encounter: Payer: Self-pay | Admitting: Sports Medicine

## 2023-09-25 ENCOUNTER — Ambulatory Visit (INDEPENDENT_AMBULATORY_CARE_PROVIDER_SITE_OTHER): Payer: 59 | Admitting: Sports Medicine

## 2023-09-25 DIAGNOSIS — M1A9XX1 Chronic gout, unspecified, with tophus (tophi): Secondary | ICD-10-CM

## 2023-09-25 DIAGNOSIS — M19041 Primary osteoarthritis, right hand: Secondary | ICD-10-CM

## 2023-09-25 MED ORDER — TRIAMCINOLONE ACETONIDE 40 MG/ML IJ SUSP
40.0000 mg | Freq: Once | INTRAMUSCULAR | Status: AC
Start: 2023-09-25 — End: 2023-09-25
  Administered 2023-09-25: 40 mg via INTRAMUSCULAR

## 2023-09-25 MED ORDER — ALLOPURINOL 300 MG PO TABS
300.0000 mg | ORAL_TABLET | Freq: Two times a day (BID) | ORAL | 3 refills | Status: AC
Start: 2023-09-25 — End: ?

## 2023-09-25 MED ORDER — MELOXICAM 15 MG PO TABS: 15.0000 mg | ORAL_TABLET | Freq: Every day | ORAL | 3 refills | Status: DC

## 2023-09-25 MED ORDER — COLCHICINE 0.6 MG PO TABS
ORAL_TABLET | ORAL | 11 refills | Status: AC
Start: 2023-09-25 — End: ?

## 2023-09-25 NOTE — Assessment & Plan Note (Signed)
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 Return as needed.

## 2023-09-25 NOTE — Addendum Note (Signed)
Addended by: Carren Rang A on: 09/25/2023 04:34 PM   Modules accepted: Orders

## 2023-09-25 NOTE — Progress Notes (Signed)
    Procedures performed today:    Procedure: Real-time Ultrasound Guided injection of the right third MCP Device: Samsung HS60  Verbal informed consent obtained.  Time-out conducted.  Noted no overlying erythema, induration, or other signs of local infection.  Skin prepped in a sterile fashion.  Local anesthesia: Topical Ethyl chloride.  With sterile technique and under real time ultrasound guidance: Noted synovitis and arthritic changes, 1/2 cc lidocaine, 1/2 cc Kenalog 40 injected easily. Completed without difficulty  Advised to call if fevers/chills, erythema, induration, drainage, or persistent bleeding.  Images permanently stored and available for review in PACS.  Impression: Technically successful ultrasound guided injection.  Independent interpretation of notes and tests performed by another provider:   None.  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 Return as needed.    ____________________________________________ Ihor Austin. Benjamin Stain, M.D., ABFM., CAQSM., AME. Primary Care and Sports Medicine [facility]  Adjunct Professor of Family Medicine  [employer] [facility] [employer]  Restaurant manager, fast [employer]

## 2024-05-14 ENCOUNTER — Ambulatory Visit: Admitting: Sports Medicine

## 2024-05-14 ENCOUNTER — Encounter: Payer: Self-pay | Admitting: Sports Medicine

## 2024-05-14 ENCOUNTER — Other Ambulatory Visit (INDEPENDENT_AMBULATORY_CARE_PROVIDER_SITE_OTHER)

## 2024-05-14 DIAGNOSIS — M19041 Primary osteoarthritis, right hand: Secondary | ICD-10-CM

## 2024-05-14 DIAGNOSIS — M19042 Primary osteoarthritis, left hand: Secondary | ICD-10-CM

## 2024-05-14 MED ORDER — TRIAMCINOLONE ACETONIDE 40 MG/ML IJ SUSP
40.0000 mg | Freq: Once | INTRAMUSCULAR | Status: AC
Start: 2024-05-14 — End: 2024-05-14
  Administered 2024-05-14: 40 mg via INTRA_ARTICULAR

## 2024-05-14 NOTE — Progress Notes (Signed)
    Procedures performed today:    Procedure: Real-time Ultrasound Guided injection of the right third MCP Device: Samsung HS60  Verbal informed consent obtained.  Time-out conducted.  Noted no overlying erythema, induration, or other signs of local infection.  Skin prepped in a sterile fashion.  Local anesthesia: Topical Ethyl chloride.  With sterile technique and under real time ultrasound guidance: Noted synovitis and arthritic changes, 1/2 cc lidocaine, 1/2 cc Kenalog  40 injected easily. Completed without difficulty  Advised to call if fevers/chills, erythema, induration, drainage, or persistent bleeding.  Images permanently stored and available for review in PACS.  Impression: Technically successful ultrasound guided injection.  Procedure: Real-time Ultrasound Guided injection of the left third MCP Device: Samsung HS60  Verbal informed consent obtained.  Time-out conducted.  Noted no overlying erythema, induration, or other signs of local infection.  Skin prepped in a sterile fashion.  Local anesthesia: Topical Ethyl chloride.  With sterile technique and under real time ultrasound guidance: Noted synovitis and arthritic changes, 1/2 cc lidocaine, 1/2 cc Kenalog  40 injected easily. Completed without difficulty  Advised to call if fevers/chills, erythema, induration, drainage, or persistent bleeding.  Images permanently stored and available for review in PACS.  Impression: Technically successful ultrasound guided injection.  Independent interpretation of notes and tests performed by another provider:   None.  Brief History, Exam, Impression, and Recommendations:    Primary osteoarthritis of both hands This pleasant 58 year old male returns, he has known right third MCP osteoarthritis, developing on the left as well now, last injected December 2024, repeat third MCP bilateral. Return to see me as needed.    ____________________________________________ Debby PARAS.  Curtis, M.D., ABFM., CAQSM., AME. Primary Care and Sports Medicine [facility]  Adjunct Professor of [facility] Medicine  [employer]  Restaurant manager, fast food

## 2024-05-14 NOTE — Assessment & Plan Note (Signed)
 This pleasant 58 year old male returns, he has known right third MCP osteoarthritis, developing on the left as well now, last injected December 2024, repeat third MCP bilateral. Return to see me as needed.

## 2024-05-14 NOTE — Addendum Note (Signed)
 Addended by: OLIVA-AVELLANEDA, Camron Essman L on: 05/14/2024 03:56 PM   Modules accepted: Orders

## 2024-06-15 ENCOUNTER — Encounter: Payer: Self-pay | Admitting: Sports Medicine

## 2024-09-22 ENCOUNTER — Other Ambulatory Visit: Payer: Self-pay

## 2024-09-22 DIAGNOSIS — M19041 Primary osteoarthritis, right hand: Secondary | ICD-10-CM

## 2024-09-22 MED ORDER — MELOXICAM 15 MG PO TABS: 15.0000 mg | ORAL_TABLET | Freq: Every day | ORAL | 0 refills | Status: AC
